# Patient Record
Sex: Female | Born: 1965 | Race: White | Hispanic: No | Marital: Single | Smoking: Never smoker
Health system: Southern US, Community
[De-identification: ages and names within clinical notes are randomized; demographics above are authoritative.]

## PROBLEM LIST (undated history)

## (undated) DIAGNOSIS — Z9889 Other specified postprocedural states: Secondary | ICD-10-CM

## (undated) DIAGNOSIS — E785 Hyperlipidemia, unspecified: Secondary | ICD-10-CM

## (undated) DIAGNOSIS — Z803 Family history of malignant neoplasm of breast: Secondary | ICD-10-CM

## (undated) DIAGNOSIS — R112 Nausea with vomiting, unspecified: Secondary | ICD-10-CM

## (undated) HISTORY — DX: Family history of malignant neoplasm of breast: Z80.3

## (undated) HISTORY — PX: TUBAL LIGATION: SHX77

## (undated) HISTORY — PX: COLONOSCOPY: SHX174

## (undated) HISTORY — DX: Hyperlipidemia, unspecified: E78.5

## (undated) HISTORY — PX: OTHER SURGICAL HISTORY: SHX169

---

## 1998-10-24 ENCOUNTER — Other Ambulatory Visit: Admission: RE | Admit: 1998-10-24 | Discharge: 1998-10-24 | Payer: Self-pay | Admitting: Obstetrics and Gynecology

## 2000-01-12 HISTORY — PX: CHOLECYSTECTOMY: SHX55

## 2000-06-14 ENCOUNTER — Other Ambulatory Visit: Admission: RE | Admit: 2000-06-14 | Discharge: 2000-06-14 | Payer: Self-pay | Admitting: Internal Medicine

## 2000-10-12 ENCOUNTER — Ambulatory Visit (HOSPITAL_COMMUNITY): Admission: RE | Admit: 2000-10-12 | Discharge: 2000-10-12 | Payer: Self-pay | Admitting: Gastroenterology

## 2000-10-12 ENCOUNTER — Encounter: Payer: Self-pay | Admitting: Gastroenterology

## 2000-10-13 ENCOUNTER — Ambulatory Visit (HOSPITAL_COMMUNITY): Admission: RE | Admit: 2000-10-13 | Discharge: 2000-10-14 | Payer: Self-pay | Admitting: General Surgery

## 2000-10-13 ENCOUNTER — Encounter (INDEPENDENT_AMBULATORY_CARE_PROVIDER_SITE_OTHER): Payer: Self-pay | Admitting: Specialist

## 2002-05-21 ENCOUNTER — Other Ambulatory Visit: Admission: RE | Admit: 2002-05-21 | Discharge: 2002-05-21 | Payer: Self-pay | Admitting: *Deleted

## 2002-05-24 ENCOUNTER — Ambulatory Visit (HOSPITAL_COMMUNITY): Admission: RE | Admit: 2002-05-24 | Discharge: 2002-05-24 | Payer: Self-pay | Admitting: *Deleted

## 2002-05-24 ENCOUNTER — Encounter (INDEPENDENT_AMBULATORY_CARE_PROVIDER_SITE_OTHER): Payer: Self-pay | Admitting: Specialist

## 2003-01-12 HISTORY — PX: DILATION AND CURETTAGE OF UTERUS: SHX78

## 2004-01-12 HISTORY — PX: OTHER SURGICAL HISTORY: SHX169

## 2004-03-05 ENCOUNTER — Encounter (INDEPENDENT_AMBULATORY_CARE_PROVIDER_SITE_OTHER): Payer: Self-pay | Admitting: *Deleted

## 2004-03-05 ENCOUNTER — Ambulatory Visit (HOSPITAL_COMMUNITY): Admission: RE | Admit: 2004-03-05 | Discharge: 2004-03-05 | Payer: Self-pay | Admitting: *Deleted

## 2004-09-22 ENCOUNTER — Encounter: Admission: RE | Admit: 2004-09-22 | Discharge: 2004-09-22 | Payer: Self-pay | Admitting: Gastroenterology

## 2006-02-09 ENCOUNTER — Encounter: Admission: RE | Admit: 2006-02-09 | Discharge: 2006-02-09 | Payer: Self-pay | Admitting: Gastroenterology

## 2006-02-11 ENCOUNTER — Encounter: Admission: RE | Admit: 2006-02-11 | Discharge: 2006-02-11 | Payer: Self-pay | Admitting: Gastroenterology

## 2006-04-06 ENCOUNTER — Other Ambulatory Visit: Admission: RE | Admit: 2006-04-06 | Discharge: 2006-04-06 | Payer: Self-pay | Admitting: Obstetrics and Gynecology

## 2006-10-27 ENCOUNTER — Other Ambulatory Visit: Admission: RE | Admit: 2006-10-27 | Discharge: 2006-10-27 | Payer: Self-pay | Admitting: Obstetrics and Gynecology

## 2007-01-12 HISTORY — PX: BREAST BIOPSY: SHX20

## 2007-02-28 ENCOUNTER — Encounter: Admission: RE | Admit: 2007-02-28 | Discharge: 2007-02-28 | Payer: Self-pay | Admitting: Gastroenterology

## 2007-03-08 ENCOUNTER — Encounter: Admission: RE | Admit: 2007-03-08 | Discharge: 2007-03-08 | Payer: Self-pay | Admitting: Gastroenterology

## 2007-03-23 ENCOUNTER — Encounter: Admission: RE | Admit: 2007-03-23 | Discharge: 2007-03-23 | Payer: Self-pay | Admitting: General Surgery

## 2007-03-23 ENCOUNTER — Encounter (INDEPENDENT_AMBULATORY_CARE_PROVIDER_SITE_OTHER): Payer: Self-pay | Admitting: Diagnostic Radiology

## 2007-05-24 ENCOUNTER — Other Ambulatory Visit: Admission: RE | Admit: 2007-05-24 | Discharge: 2007-05-24 | Payer: Self-pay | Admitting: Obstetrics and Gynecology

## 2008-03-04 ENCOUNTER — Encounter: Admission: RE | Admit: 2008-03-04 | Discharge: 2008-03-04 | Payer: Self-pay | Admitting: Gastroenterology

## 2008-11-05 ENCOUNTER — Other Ambulatory Visit: Admission: RE | Admit: 2008-11-05 | Discharge: 2008-11-05 | Payer: Self-pay | Admitting: Obstetrics and Gynecology

## 2009-03-05 ENCOUNTER — Encounter: Admission: RE | Admit: 2009-03-05 | Discharge: 2009-03-05 | Payer: Self-pay | Admitting: Gastroenterology

## 2009-11-13 ENCOUNTER — Other Ambulatory Visit: Admission: RE | Admit: 2009-11-13 | Discharge: 2009-11-13 | Payer: Self-pay | Admitting: Obstetrics and Gynecology

## 2010-01-31 ENCOUNTER — Other Ambulatory Visit: Payer: Self-pay | Admitting: Gastroenterology

## 2010-01-31 DIAGNOSIS — Z803 Family history of malignant neoplasm of breast: Secondary | ICD-10-CM

## 2010-02-01 ENCOUNTER — Encounter: Payer: Self-pay | Admitting: Gastroenterology

## 2010-03-06 ENCOUNTER — Ambulatory Visit
Admission: RE | Admit: 2010-03-06 | Discharge: 2010-03-06 | Disposition: A | Discharge status: Home / Self Care | Payer: PRIVATE HEALTH INSURANCE | Source: Ambulatory Visit | Attending: Gastroenterology | Admitting: Gastroenterology

## 2010-03-06 DIAGNOSIS — Z803 Family history of malignant neoplasm of breast: Secondary | ICD-10-CM

## 2010-05-29 NOTE — Op Note (Signed)
   NAME:  Sally Brown, DEMARINIS                       ACCOUNT NO.:  [id number]   MEDICAL RECORD NO.:  [id number]                   PATIENT TYPE:  AMB   LOCATION:  SDC                                  FACILITY:  [facility]   PHYSICIAN:   B. Earlene Plater, M.D.               DATE OF BIRTH:  16-Jan-1965   DATE OF PROCEDURE:  05/24/2002  DATE OF DISCHARGE:                                 OPERATIVE REPORT   PREOPERATIVE DIAGNOSIS:  Menometrorrhagia.   POSTOPERATIVE DIAGNOSIS:  Menometrorrhagia.   PROCEDURE:  Hysteroscopy, dilatation and curettage.   SURGEON:  Chester Holstein. Earlene Plater, M.D.   ANESTHESIA:  MAC and 10 cc of 2% Mesocaine paracervical block.   FINDINGS:  Shaggy endometrium, normal-appearing tubal ostia, no focal  lesions.   SPECIMENS:  Endometrial curettings.   COMPLICATIONS:  None.   BLOOD LOSS:  Less than 50 cc.   FLUID DEFICIT:  60 cc sorbitol.   INDICATION:  The patient with history of heavy bleeding with her menses and  bleeding in between.  Ultrasound was suggestive of focal endometrial lesions  consistent with polyps.  The patient declined sonohysterogram and preferred  to proceed directly to hysteroscopy.   DESCRIPTION OF PROCEDURE:  The patient was taken to the operating room and  MAC anesthesia obtained.  She was placed in the ski position, and prepped  and draped in a standard fashion.  The bladder was emptied with red rubber  catheter.  Exam under anesthesia showed a retroverted, normal-size uterus,  no adnexal masses palpable.   Speculum inserted.  Paracervical block placed in standard technique.  Anterior lip of the cervix grasped with a toothed tenaculum.  The uterus was  sounded to approximately 8 cm and again confirmed to be retroverted.   The cervix was easily dilated to #21.  The diagnostic hysteroscope was  inserted after being flushed with sorbitol with good uterine distention.  The endometrial cavity was visualized completely.  No focal lesion noted.  Shaggy  endometrium seen.  No definitive polyp identified.  The uterus was  then sharply curetted, and the specimen sent to pathology.   The patient tolerated the procedure well.  There were no complications.  Cervix was hemostatic after removal of the single-toothed tenaculum.  She  was taken to the recovery room awake, alert, and in stable condition.                                               Gerri Spore B. Earlene Plater, M.D.    WBD/MEDQ  D:  05/24/2002  T:  05/25/2002  Job:  [id number]

## 2010-05-29 NOTE — Op Note (Signed)
[place]  Patient:    MAKALAH, ASBERRY Visit Number: [id number] MRN: [id number]          Service Type: Attending:  Jimmye Norman, M.D. Dictated by:   Jimmye Norman, M.D. Proc. Date: 10/13/00                             Operative Report  PREOPERATIVE DIAGNOSIS:  Acute cholecystitis and cholelithiasis.  POSTOPERATIVE DIAGNOSIS:  Acute cholecystitis and cholelithiasis.  PROCEDURE:  Laparoscopic cholecystectomy.  SURGEON:  Jimmye Norman, M.D.  ASSISTANTDonnella Bi D. Pendse, M.D.  ANESTHESIA:  General endotracheal.  ESTIMATED BLOOD LOSS:  Less than 30 cc.  COMPLICATIONS:  None.  CONDITION:  Stable.  INDICATIONS FOR OPERATION:  The patient is a 45 year old with nausea, vomiting, and abdominal pain and positive sonographic Murphys, with large stones, who comes in now for an urgent cholecystectomy.  FINDINGS:  The patient had a moderately acutely inflamed gallbladder with large stones.  Anatomy was otherwise unremarkable.  DESCRIPTION OF PROCEDURE:  The patient was taken to the operating room and placed on the table in supine position.  After an adequate general anesthetic was administered, she was prepped and draped in the usual sterile manner, exposing the midline and the right upper quadrant of her abdomen.  A supraumbilical curvilinear incision was made using a #11 blade and dissected down to the fascia.  We used the Hasson technique at this time.  The fascia was grasped with Kocher clamps.  We split the fascia in between that and subsequently placed a pursestring.  We then used a Kelly clamp to bluntly get into the peritoneal cavity and subsequently passed the Hasson, secured it in place with the pursestring suture of 0 Vicryl.  Subsequently two right costal margin 5 mm cannulas were passed under direct vision, and a subxiphoid 10/11 mm cannula passed.  With all cannulas in place, the patient was placed in reverse Trendelenburg.  The  left side was tilted down.  The gallbladder was dissected out.  The peritoneum overlying the triangle of Calot and hepatoduodenal triangle was dissected out, exposing the cystic duct and the cystic artery with minimal ambiguity.  Both were clipped proximally and distally and then transected. The gallbladder was then dissected out of its bed with electrocautery; however, we did enter the gallbladder once near the dome and we had to use an Endocatch bag to bring it out through the supraumbilical site.  Once this was done we irrigated with 2 L of warm saline.  There was minimal bleeding and clear effluent on return.  We ligated the supraumbilical fascial site using the pursestring suture, which was left in place, and then we closed all skin sites with subcuticular stitches of 4-0 Vicryl.  Marcaine 0.25% was injected at the incision sites.  All needle counts, sponge counts, and instrument counts were correct.  Sterile dressings applied. Dictated by:   Jimmye Norman, M.D. Attending:  Jimmye Norman, M.D. DD:  10/13/00 TD:  10/13/00 Job: [id number]

## 2010-05-29 NOTE — H&P (Signed)
NAMEJANIA, Sally Brown                         ACCOUNT NO.:  [id number]   MEDICAL RECORD NO.:  [id number]                   PATIENT TYPE:  AMB   LOCATION:  SDC                                  [facility]ACILITY:  [facility]   PHYSICIAN:  [place] B. Earlene Plater, M.D.               DATE OF BIRTH:  1965-01-19   DATE OF ADMISSION:  05/24/2002  DATE OF DISCHARGE:                                HISTORY & PHYSICAL   CHIEF COMPLAINT:  Abnormal bleeding.   PROCEDURE:  Hysteroscopy and D&C.   HISTORY OF PRESENT ILLNESS:  A 45 year old white female, gravida 4, para 3,  abortus 1, with a several month history of heavy vaginal bleeding usually  around the time of her menses, but also in between bleeding. Often it  soaking through a Super tampon and Maxi pad at the same time.  Pelvic  ultrasound in the office showed no evidence of uterine fibroids, however,  the endometrial lining itself was thickened and there were focal areas of  hyperechogenic appearance consistent with polyps.  I offered the patient a  saline sonogram to further delineate, however, the patient would like to  move on to hysteroscopy for definitive diagnosis and treatment.   PAST MEDICAL HISTORY:  Hypercholesterolemia.   PAST SURGICAL HISTORY:  Tubal ligation, arm surgery on the left arm, and  laparoscopic cholecystectomy.   MEDICATIONS:  Lipitor.   ALLERGIES:  CODEINE, SULFA.   SOCIAL HISTORY:  No alcohol, tobacco, or drugs.   FAMILY HISTORY:  Noncontributory.   REVIEW OF SYSTEMS:  Otherwise negative.   PHYSICAL EXAMINATION:  VITAL SIGNS:  Blood pressure 112/80, weight 170,  height 5 foot 2 inches.  GENERAL:  Alert and oriented and in no acute distress.  SKIN:  Warm and dry with no lesions.  HEART:  Regular rate and rhythm.  LUNGS:  Clear to auscultation.  ABDOMEN:  Liver and spleen are normal with no hernia.  PELVIC:  Normal external genitalia.  Vagina and cervix normal.   Pelvic ultrasound as outlined above shows retroverted,  normal size uterus  with three hypoechoic areas noted in the endometrial lining consistent with  polyps.   ASSESSMENT:  Abnormal bleeding and ultrasound suggestion of endometrial  polyp.    PLAN:  Hysteroscopy and D&C, polyp removal.  Operative risks discussed  including infection, bleeding, uterine perforation, and damage to  surrounding organs.  All questions were answered and the patient wishes to  proceed.                                               Gerri Spore B. Earlene Plater, M.D.    WBD/MEDQ  D:  05/23/2002  T:  05/23/2002  Job:  [id number]   cc:   Olene Craven, M.D.  [address]  [address]  [place]  [place] [address]  Fax: [phone]

## 2010-05-29 NOTE — H&P (Signed)
[place]  Patient:    OZETTA, FLATLEY Visit Number: [id number] MRN: [id number]          Service Type: DSU Location: [id number] Attending Physician:  Cherylynn Ridges Dictated by:   Jimmye Norman, M.D. Admit Date:  10/13/2000                           History and Physical  IDENTIFICATION AND CHIEF COMPLAINT:  The patient is a 45 year old female with symptomatic cholelithiasis and subacute cholecystitis.  HISTORY OF PRESENT ILLNESS:  The patient has been plagued mostly with vomiting over the last several weeks, worsening the last three days.  She was seen by her primary care physician who did an ultrasound that showed a thickened gallbladder wall with large stones and surgical consultation was obtained. She was tender in her right upper quadrant with epigastric and right upper quadrant pain and had a sonographic Murphys sign.  She now comes in for an urgent laparoscopic cholecystectomy.  PAST MEDICAL HISTORY:  Significant only for hypercholesterolemia, for which she takes Lipitor.  ALLERGIES:  CODEINE.  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  REVIEW OF SYSTEMS:  She has had no fevers or chills, no jaundice, no constipation or diarrhea, no respiratory problems, no shortness of breath or chest pain.  PHYSICAL EXAMINATION:  VITAL SIGNS:  She is afebrile.  Other vital signs are stable.  HEENT:  She is normocephalic and atraumatic and anicteric.  NECK:  Supple.  CHEST:  Clear.  CARDIAC:  Regular rhythm and rate with no murmurs, gallops, rubs, or heaves.  ABDOMEN:  Soft and tender in the right upper quadrant with no rebound or guarding.  She does have good bowel sounds.  She has been n.p.o. since yesterday.  RECTAL AND PELVIC:  Deferred.  LABORATORY STUDIES:  Show normal LFTs, normal white count, normal hemoglobin.  IMPRESSION:  Symptomatic cholelithiasis  and possible subacute cholecystitis.  PLAN:  Laparoscopic cholecystectomy.  No plan for cholangiogram currently unless the anatomy is confusing.Dictated by:   Jimmye Norman, M.D.  Attending Physician:  Cherylynn Ridges DD:  10/13/00 TD:  10/13/00 Job: [id number]

## 2010-05-29 NOTE — Op Note (Signed)
Sally Brown, Sally Brown             ACCOUNT NO.:  [id number]   MEDICAL RECORD NO.:  [id number]          PATIENT TYPE:  AMB   LOCATION:  SDC                           [facility]ILITY:  [facility]   PHYSICIAN:  [place] B. Earlene Plater, M.D.  DATE OF BIRTH:  [birth year]   DATE OF PROCEDURE:  03/05/2004  DATE OF DISCHARGE:                                 OPERATIVE REPORT   PREOPERATIVE DIAGNOSES:  1.  Abnormal bleeding.  2.  Endometrial polyp.   POSTOPERATIVE DIAGNOSES:  1.  Abnormal bleeding.  2.  Endometrial polyp.   PROCEDURE:  Hysteroscopy, dilatation and curettage, polyp removal and  NovaSure endometrial ablation.   SURGEON:  Chester Holstein. Earlene Plater, M.D.   ANESTHESIA:  MAC and 20 mL 1% Nesacaine paracervical block.   SPECIMENS:  Endometrial polyp and endometrial curettings.   FINDINGS:  Endometrial polyp in the right cornu, otherwise no other  abnormalities.   ESTIMATED BLOOD LOSS:  Minimal.   FLUID DEFICIT:  100 mL.   COMPLICATIONS:  None.   INDICATIONS:  Patient with a history of irregular heavy vaginal bleeding.  Ultrasound suggestive of a focal endometrial polyp.  The patient was also  requesting permanent endometrial ablation for minimally-invasive treatment  of her heavy menstrual bleeding.   PROCEDURE:  The patient was taken to the operating room and MAC anesthesia  obtained.  She was prepped and draped in standard fashion and bladder  emptied with a red rubber catheter.  Exam under anesthesia showed a slightly  retroverted uterus with no adnexal masses, at the upper limits of normal  size for the uterus.  A speculum inserted, paracervical block placed, and a  single-tooth attached to the anterior lip of the cervix.  The uterus was  sounded to 8 cm, cervical length estimated with Shawnie Pons dilator at 4 cm,  diagnostic hysteroscope was inserted after being flushed with lactated  Ringer's.  The cavity looked normal other than an endometrial polyp at the  right cornu.  I attempted to remove this  with Randall stone forceps but was  unsuccessful.  The cervix was then dilated up to a #31 and the resectoscope  inserted after being flushed with sorbitol.  Attempt made to resect.  However, due to location the resectoscope loop could not access the polyp.  I therefore switched over to the operative channel on the scope and inserted  a grasper and removed the polyp directly.   The distention medium was switched back to Ringer's and the cavity flushed.   The NovaSure device was then inserted to the fundus and the array deployed.  The standard maneuvers then undertaken to fully deploy the array.  The  cavity width was 4.6 cm.   CO2 test was performed and passed.  The device was energized.  The power was  101 watts.  The time was two minutes.  The array was retracted and device  removed, scope reinserted and good endometrial coverage noted.   The scope was removed and the single-tooth removed.  Each site from the  single-tooth was bleeding.  They were made hemostatic with a simple stitch  of 2-0 chromic.  The patient tolerated the procedure well.  There were no complications.  She  was taken to the recovery room awake and alert, in stable condition.      WBD/MEDQ  D:  03/05/2004  T:  03/05/2004  Job:  [id number]

## 2011-02-08 ENCOUNTER — Other Ambulatory Visit: Payer: Self-pay | Admitting: Gastroenterology

## 2011-02-08 DIAGNOSIS — Z1231 Encounter for screening mammogram for malignant neoplasm of breast: Secondary | ICD-10-CM

## 2011-03-09 ENCOUNTER — Ambulatory Visit
Admission: RE | Admit: 2011-03-09 | Discharge: 2011-03-09 | Disposition: A | Discharge status: Home / Self Care | Payer: PRIVATE HEALTH INSURANCE | Source: Ambulatory Visit | Attending: Gastroenterology | Admitting: Gastroenterology

## 2011-03-09 DIAGNOSIS — Z1231 Encounter for screening mammogram for malignant neoplasm of breast: Secondary | ICD-10-CM

## 2011-08-30 ENCOUNTER — Other Ambulatory Visit: Payer: Self-pay | Admitting: Surgery

## 2012-02-04 ENCOUNTER — Other Ambulatory Visit: Payer: Self-pay | Admitting: Gastroenterology

## 2012-02-04 DIAGNOSIS — Z1231 Encounter for screening mammogram for malignant neoplasm of breast: Secondary | ICD-10-CM

## 2012-03-09 ENCOUNTER — Ambulatory Visit
Admission: RE | Admit: 2012-03-09 | Discharge: 2012-03-09 | Disposition: A | Discharge status: Home / Self Care | Payer: BC Managed Care – PPO | Source: Ambulatory Visit | Attending: Gastroenterology | Admitting: Gastroenterology

## 2012-03-09 DIAGNOSIS — Z1231 Encounter for screening mammogram for malignant neoplasm of breast: Secondary | ICD-10-CM

## 2012-11-23 DIAGNOSIS — Z3202 Encounter for pregnancy test, result negative: Secondary | ICD-10-CM | POA: Insufficient documentation

## 2012-11-23 DIAGNOSIS — R109 Unspecified abdominal pain: Secondary | ICD-10-CM | POA: Insufficient documentation

## 2012-11-23 DIAGNOSIS — Z9089 Acquired absence of other organs: Secondary | ICD-10-CM | POA: Insufficient documentation

## 2012-11-23 DIAGNOSIS — R0602 Shortness of breath: Secondary | ICD-10-CM | POA: Insufficient documentation

## 2012-11-23 DIAGNOSIS — R112 Nausea with vomiting, unspecified: Secondary | ICD-10-CM | POA: Insufficient documentation

## 2012-11-24 ENCOUNTER — Emergency Department (HOSPITAL_COMMUNITY)
Admission: EM | Admit: 2012-11-24 | Discharge: 2012-11-24 | Disposition: A | Discharge status: Home / Self Care | Payer: BC Managed Care – PPO | Attending: Emergency Medicine | Admitting: Emergency Medicine

## 2012-11-24 ENCOUNTER — Encounter (HOSPITAL_COMMUNITY): Payer: Self-pay | Admitting: Emergency Medicine

## 2012-11-24 ENCOUNTER — Emergency Department (HOSPITAL_COMMUNITY): Payer: BC Managed Care – PPO

## 2012-11-24 DIAGNOSIS — R109 Unspecified abdominal pain: Secondary | ICD-10-CM

## 2012-11-24 LAB — URINALYSIS, ROUTINE W REFLEX MICROSCOPIC
Bilirubin Urine: NEGATIVE
Glucose, UA: NEGATIVE mg/dL
Ketones, ur: NEGATIVE mg/dL
Protein, ur: NEGATIVE mg/dL
pH: 6 (ref 5.0–8.0)

## 2012-11-24 LAB — CBC WITH DIFFERENTIAL/PLATELET
Basophils Absolute: 0.1 10*3/uL (ref 0.0–0.1)
Basophils Relative: 1 % (ref 0–1)
Eosinophils Absolute: 0.5 10*3/uL (ref 0.0–0.7)
Eosinophils Relative: 5 % (ref 0–5)
HCT: 37.5 % (ref 36.0–46.0)
Hemoglobin: 13.1 g/dL (ref 12.0–15.0)
MCH: 29.4 pg (ref 26.0–34.0)
MCHC: 34.9 g/dL (ref 30.0–36.0)
MCV: 84.1 fL (ref 78.0–100.0)
Monocytes Absolute: 0.7 10*3/uL (ref 0.1–1.0)
Monocytes Relative: 7 % (ref 3–12)
RDW: 13.4 % (ref 11.5–15.5)

## 2012-11-24 LAB — COMPREHENSIVE METABOLIC PANEL
AST: 20 U/L (ref 0–37)
Albumin: 3.8 g/dL (ref 3.5–5.2)
BUN: 17 mg/dL (ref 6–23)
Calcium: 9.6 mg/dL (ref 8.4–10.5)
Chloride: 103 mEq/L (ref 96–112)
Creatinine, Ser: 0.69 mg/dL (ref 0.50–1.10)
Total Bilirubin: 0.2 mg/dL — ABNORMAL LOW (ref 0.3–1.2)
Total Protein: 7 g/dL (ref 6.0–8.3)

## 2012-11-24 LAB — WET PREP, GENITAL
Clue Cells Wet Prep HPF POC: NONE SEEN
Trich, Wet Prep: NONE SEEN
Yeast Wet Prep HPF POC: NONE SEEN

## 2012-11-24 LAB — POCT PREGNANCY, URINE: Preg Test, Ur: NEGATIVE

## 2012-11-24 LAB — LIPASE, BLOOD: Lipase: 47 U/L (ref 11–59)

## 2012-11-24 MED ORDER — ONDANSETRON HCL 4 MG PO TABS: 4.0000 mg | ORAL_TABLET | Freq: Four times a day (QID) | ORAL | Status: DC

## 2012-11-24 MED ORDER — OXYCODONE-ACETAMINOPHEN 5-325 MG PO TABS: 1.0000 | ORAL_TABLET | Freq: Four times a day (QID) | ORAL | Status: DC | PRN

## 2012-11-24 MED ORDER — SODIUM CHLORIDE 0.9 % IV BOLUS (SEPSIS)
1000.0000 mL | Freq: Once | INTRAVENOUS | Status: AC
  Administered 2012-11-24: 1000 mL via INTRAVENOUS

## 2012-11-24 MED ORDER — IOHEXOL 300 MG/ML  SOLN
100.0000 mL | Freq: Once | INTRAMUSCULAR | Status: AC | PRN
  Administered 2012-11-24: 100 mL via INTRAVENOUS

## 2012-11-24 MED ORDER — IOHEXOL 300 MG/ML  SOLN
50.0000 mL | Freq: Once | INTRAMUSCULAR | Status: AC | PRN
  Administered 2012-11-24: 50 mL via ORAL

## 2012-11-24 MED ORDER — MORPHINE SULFATE 4 MG/ML IJ SOLN
4.0000 mg | Freq: Once | INTRAMUSCULAR | Status: AC
  Administered 2012-11-24: 4 mg via INTRAVENOUS
  Filled 2012-11-24: qty 1

## 2012-11-24 MED ORDER — ONDANSETRON HCL 4 MG/2ML IJ SOLN
4.0000 mg | INTRAMUSCULAR | Status: AC
  Administered 2012-11-24: 4 mg via INTRAVENOUS
  Filled 2012-11-24: qty 2

## 2012-11-24 NOTE — ED Notes (Signed)
Returned from CT.

## 2012-11-24 NOTE — ED Provider Notes (Signed)
CSN: [id number]     Arrival date & time 11/23/12  2357 History   First MD Initiated Contact with Patient 11/24/12 0103     Chief Complaint  Patient presents with  . Abdominal Pain   (Consider location/radiation/quality/duration/timing/severity/associated sxs/prior Treatment) HPI Comments: Patient is a 47 y/o female with no significant PMH and surgical hx significant for cholecystectomy who presents for abdominal pain. Patient states that pain is located in her left mid abdomen and radiates down towards her left inguinal region. Patient states the pain has been sporadic over the past week, but constant since yesterday morning. Pain has been gradually worsening since yesterday and without alleviating factors; patient took 800mg  ibuprofen without relief. She states that movement, walking, and driving over bumps on the way to the ED made her pain worse. She endorses shortness of breath and 2 episodes of NB/NB emesis associated with worsening pain. She denies associated fever, CP, diarrhea, melena, hematochezia, dysuria, hematuria, vaginal bleeding or d/c, back pain, and numbness/tingling.  The history is provided by the patient. No language interpreter was used.    History reviewed. No pertinent past medical history. History reviewed. No pertinent past surgical history. History reviewed. No pertinent family history. History  Substance Use Topics  . Smoking status: Never Smoker   . Smokeless tobacco: Not on file  . Alcohol Use: Yes   OB History   Grav Para Term Preterm Abortions TAB SAB Ect Mult Living                 Review of Systems  Constitutional: Negative for fever.  Respiratory: Positive for shortness of breath.   Gastrointestinal: Positive for nausea, vomiting and abdominal pain.  All other systems reviewed and are negative.    Allergies  Codeine and Sulfa antibiotics  Home Medications   Current Outpatient Rx  Name  Route  Sig  Dispense  Refill  . ibuprofen  (ADVIL,MOTRIN) 200 MG tablet   Oral   Take 800 mg by mouth every 6 (six) hours as needed. For pain         . ondansetron (ZOFRAN) 4 MG tablet   Oral   Take 1 tablet (4 mg total) by mouth every 6 (six) hours.   12 tablet   0   . oxyCODONE-acetaminophen (PERCOCET/ROXICET) 5-325 MG per tablet   Oral   Take 1-2 tablets by mouth every 6 (six) hours as needed for severe pain.   13 tablet   0    BP 125/81  Pulse 53  Temp(Src) 97.6 F (36.4 C) (Oral)  Resp 18  Ht 5\' 2"  (1.575 m)  Wt 160 lb (72.576 kg)  BMI 29.26 kg/m2  SpO2 95%  LMP 11/23/2012  Physical Exam  Nursing note and vitals reviewed. Constitutional: She is oriented to person, place, and time. She appears well-developed and well-nourished. No distress.  HENT:  Head: Normocephalic and atraumatic.  Eyes: Conjunctivae and EOM are normal. No scleral icterus.  Neck: Normal range of motion.  Cardiovascular: Normal rate and normal heart sounds.   Pulmonary/Chest: Effort normal and breath sounds normal. No respiratory distress. She has no wheezes. She has no rales.  Abdominal: Soft. Normal appearance. She exhibits no distension and no pulsatile midline mass. There is tenderness. There is no rebound, no guarding, no tenderness at McBurney's point and negative Murphy's sign.    No peritoneal signs or involuntary guarding  Genitourinary: Vagina normal and uterus normal. There is no rash, tenderness, lesion or injury on the right labia. There is  no rash, tenderness, lesion or injury on the left labia. Cervix exhibits no motion tenderness, no discharge and no friability. Right adnexum displays no mass, no tenderness and no fullness. Left adnexum displays no mass, no tenderness and no fullness. No tenderness around the vagina. No vaginal discharge found.  Musculoskeletal: Normal range of motion.  Neurological: She is alert and oriented to person, place, and time.  Skin: Skin is warm and dry. No rash noted. She is not diaphoretic. No  erythema. No pallor.  Psychiatric: She has a normal mood and affect. Her behavior is normal.    ED Course  Procedures (including critical care time) Labs Review Labs Reviewed  COMPREHENSIVE METABOLIC PANEL - Abnormal; Notable for the following:    Glucose, Bld 112 (*)    Total Bilirubin 0.2 (*)    All other components within normal limits  WET PREP, GENITAL  GC/CHLAMYDIA PROBE AMP  URINALYSIS, ROUTINE W REFLEX MICROSCOPIC  CBC WITH DIFFERENTIAL  LIPASE, BLOOD  POCT PREGNANCY, URINE   Imaging Review Ct Abdomen Pelvis W Contrast  11/24/2012   CLINICAL DATA:  Left lower quadrant abdominal pain for 1 week.  EXAM: CT ABDOMEN AND PELVIS WITH CONTRAST  TECHNIQUE: Multidetector CT imaging of the abdomen and pelvis was performed using the standard protocol following bolus administration of intravenous contrast.  CONTRAST:  OMNIPAQUE IOHEXOL 300 MG/ML  SOLN  COMPARISON:  None.  FINDINGS: The visualized lung bases are clear.  The liver and spleen are unremarkable in appearance. The patient is status post cholecystectomy, with clips noted in the gallbladder fossa. The pancreas and adrenal glands are unremarkable.  The kidneys are unremarkable in appearance. There is no evidence of hydronephrosis. No renal or ureteral stones are seen. No perinephric stranding is appreciated.  No free fluid is identified. The small bowel is unremarkable in appearance. The stomach is within normal limits. No acute vascular abnormalities are seen.  The appendix is normal in caliber, without evidence for appendicitis. The colon is partially filled with stool. Minimal diverticulosis is noted along the proximal sigmoid colon, without evidence of diverticulitis.  The bladder is largely decompressed and grossly unremarkable in appearance. The uterus is within normal limits. The ovaries are relatively symmetric; no suspicious adnexal masses are seen. There appears to be a small left-sided hydrosalpinx; given apparent wall  enhancement, mild pyosalpinx cannot be excluded, though no significant associated soft tissue inflammation is seen. No inguinal lymphadenopathy is seen.  No acute osseous abnormalities are identified.  IMPRESSION: 1. Apparent small left-sided hydrosalpinx noted. Given mild wall enhancement, mild pyosalpinx cannot be excluded, though no significant associated soft tissue inflammation is seen. Suggest clinical correlation for associated symptoms. 2. Minimal diverticulosis along the proximal sigmoid colon, without evidence of diverticulitis.   Electronically Signed   By: Roanna Raider M.D.   On: 11/24/2012 02:52    EKG Interpretation   None       MDM   1. Abdominal pain    Patient presents for pain to her L mid abdomen, intermittent for 1 week but constant since yesterday AM and worsening. Patient well and nontoxic appearing, hemodynamically stable, and afebrile on arrival. Physical exam with focal TTP to L mid abdomen without peritoneal signs or guarding. W/u included CBC, CMP, lipase, UA, wet prep, GC/Chlamydia, and Upreg all of which were unremarkable. CT abd pelvis ordered for further evaluation of symptoms with relatively unremarkable imaging; CT findings only with small L hydrosalpinx vs pyosalpinx without soft tissue inflammation.  Have discussed further work  up with pelvic U/S today which patient declines. Her pain has been very well controlled with IVF, morphine, and zofran. Believe she is stable and appropriate for d/c with OBGYN follow up. Percocet prescribed for pain control and zofran prescribed for nausea. Return precautions discussed and patient agreeable to plan with no unaddressed concerns.  Antony Madura, PA-C 11/24/12 2234

## 2012-11-24 NOTE — ED Notes (Signed)
Pt complains of abd pain for one week, tonight she states the pain is severe, no vomiting or diarrhea

## 2012-11-25 NOTE — ED Provider Notes (Signed)
Medical screening examination/treatment/procedure(s) were performed by non-physician practitioner and as supervising physician I was immediately available for consultation/collaboration.  Aolani Piggott, MD 11/25/12 2237 

## 2013-01-30 ENCOUNTER — Other Ambulatory Visit: Payer: Self-pay

## 2013-01-30 DIAGNOSIS — Z1231 Encounter for screening mammogram for malignant neoplasm of breast: Secondary | ICD-10-CM

## 2013-03-12 ENCOUNTER — Ambulatory Visit
Admission: RE | Admit: 2013-03-12 | Discharge: 2013-03-12 | Disposition: A | Discharge status: Home / Self Care | Payer: BC Managed Care – PPO | Source: Ambulatory Visit

## 2013-03-12 DIAGNOSIS — Z1231 Encounter for screening mammogram for malignant neoplasm of breast: Secondary | ICD-10-CM

## 2013-10-09 ENCOUNTER — Other Ambulatory Visit (HOSPITAL_COMMUNITY)
Admission: RE | Admit: 2013-10-09 | Discharge: 2013-10-09 | Disposition: A | Discharge status: Home / Self Care | Payer: BC Managed Care – PPO | Source: Ambulatory Visit | Attending: Obstetrics and Gynecology | Admitting: Obstetrics and Gynecology

## 2013-10-09 ENCOUNTER — Other Ambulatory Visit: Payer: Self-pay | Admitting: Obstetrics and Gynecology

## 2013-10-09 DIAGNOSIS — Z1151 Encounter for screening for human papillomavirus (HPV): Secondary | ICD-10-CM | POA: Insufficient documentation

## 2013-10-09 DIAGNOSIS — Z01419 Encounter for gynecological examination (general) (routine) without abnormal findings: Secondary | ICD-10-CM | POA: Insufficient documentation

## 2013-10-10 LAB — CYTOLOGY - PAP

## 2013-10-26 ENCOUNTER — Other Ambulatory Visit: Payer: Self-pay

## 2014-02-13 ENCOUNTER — Other Ambulatory Visit: Payer: Self-pay

## 2014-02-13 DIAGNOSIS — Z1231 Encounter for screening mammogram for malignant neoplasm of breast: Secondary | ICD-10-CM

## 2014-03-14 ENCOUNTER — Ambulatory Visit
Admission: RE | Admit: 2014-03-14 | Discharge: 2014-03-14 | Disposition: A | Discharge status: Home / Self Care | Payer: BLUE CROSS/BLUE SHIELD | Source: Ambulatory Visit

## 2014-03-14 DIAGNOSIS — Z1231 Encounter for screening mammogram for malignant neoplasm of breast: Secondary | ICD-10-CM

## 2014-10-08 ENCOUNTER — Other Ambulatory Visit: Payer: Self-pay | Admitting: Obstetrics and Gynecology

## 2014-10-08 ENCOUNTER — Other Ambulatory Visit (HOSPITAL_COMMUNITY)
Admission: RE | Admit: 2014-10-08 | Discharge: 2014-10-08 | Disposition: A | Discharge status: Home / Self Care | Payer: BLUE CROSS/BLUE SHIELD | Source: Ambulatory Visit | Attending: Obstetrics and Gynecology | Admitting: Obstetrics and Gynecology

## 2014-10-08 DIAGNOSIS — Z01419 Encounter for gynecological examination (general) (routine) without abnormal findings: Secondary | ICD-10-CM | POA: Insufficient documentation

## 2014-10-10 LAB — CYTOLOGY - PAP

## 2015-02-18 ENCOUNTER — Other Ambulatory Visit: Payer: Self-pay | Admitting: Gastroenterology

## 2015-02-18 DIAGNOSIS — Z1231 Encounter for screening mammogram for malignant neoplasm of breast: Secondary | ICD-10-CM

## 2015-02-26 ENCOUNTER — Other Ambulatory Visit: Payer: Self-pay | Admitting: Gastroenterology

## 2015-03-17 ENCOUNTER — Ambulatory Visit: Payer: BLUE CROSS/BLUE SHIELD

## 2015-03-24 ENCOUNTER — Ambulatory Visit
Admission: RE | Admit: 2015-03-24 | Discharge: 2015-03-24 | Disposition: A | Discharge status: Home / Self Care | Payer: BLUE CROSS/BLUE SHIELD | Source: Ambulatory Visit | Attending: Gastroenterology | Admitting: Gastroenterology

## 2015-03-24 DIAGNOSIS — Z1231 Encounter for screening mammogram for malignant neoplasm of breast: Secondary | ICD-10-CM

## 2016-02-17 ENCOUNTER — Other Ambulatory Visit: Payer: Self-pay | Admitting: Gastroenterology

## 2016-02-17 DIAGNOSIS — Z1231 Encounter for screening mammogram for malignant neoplasm of breast: Secondary | ICD-10-CM

## 2016-03-25 ENCOUNTER — Ambulatory Visit
Admission: RE | Admit: 2016-03-25 | Discharge: 2016-03-25 | Disposition: A | Discharge status: Home / Self Care | Payer: PRIVATE HEALTH INSURANCE | Source: Ambulatory Visit | Attending: Gastroenterology | Admitting: Gastroenterology

## 2016-03-25 DIAGNOSIS — Z1231 Encounter for screening mammogram for malignant neoplasm of breast: Secondary | ICD-10-CM

## 2016-05-21 ENCOUNTER — Other Ambulatory Visit: Payer: Self-pay | Admitting: Gastroenterology

## 2016-05-21 ENCOUNTER — Ambulatory Visit
Admission: RE | Admit: 2016-05-21 | Discharge: 2016-05-21 | Disposition: A | Discharge status: Home / Self Care | Payer: PRIVATE HEALTH INSURANCE | Source: Ambulatory Visit | Attending: Gastroenterology | Admitting: Gastroenterology

## 2016-05-21 DIAGNOSIS — R05 Cough: Secondary | ICD-10-CM

## 2016-05-21 DIAGNOSIS — R059 Cough, unspecified: Secondary | ICD-10-CM

## 2016-11-22 ENCOUNTER — Other Ambulatory Visit: Payer: Self-pay | Admitting: Obstetrics and Gynecology

## 2016-11-22 ENCOUNTER — Other Ambulatory Visit (HOSPITAL_COMMUNITY)
Admission: RE | Admit: 2016-11-22 | Discharge: 2016-11-22 | Disposition: A | Discharge status: Home / Self Care | Payer: PRIVATE HEALTH INSURANCE | Source: Ambulatory Visit | Attending: Obstetrics and Gynecology | Admitting: Obstetrics and Gynecology

## 2016-11-22 DIAGNOSIS — Z124 Encounter for screening for malignant neoplasm of cervix: Secondary | ICD-10-CM | POA: Diagnosis present

## 2016-11-26 LAB — CYTOLOGY - PAP
Diagnosis: NEGATIVE
HPV: NOT DETECTED

## 2016-12-08 ENCOUNTER — Other Ambulatory Visit: Payer: Self-pay | Admitting: Obstetrics and Gynecology

## 2017-03-03 ENCOUNTER — Other Ambulatory Visit: Payer: Self-pay | Admitting: Gastroenterology

## 2017-03-03 DIAGNOSIS — Z1231 Encounter for screening mammogram for malignant neoplasm of breast: Secondary | ICD-10-CM

## 2017-04-04 ENCOUNTER — Ambulatory Visit
Admission: RE | Admit: 2017-04-04 | Discharge: 2017-04-04 | Disposition: A | Discharge status: Home / Self Care | Payer: PRIVATE HEALTH INSURANCE | Source: Ambulatory Visit | Attending: Gastroenterology | Admitting: Gastroenterology

## 2017-04-04 DIAGNOSIS — Z1231 Encounter for screening mammogram for malignant neoplasm of breast: Secondary | ICD-10-CM

## 2017-04-06 ENCOUNTER — Other Ambulatory Visit: Payer: Self-pay | Admitting: Urology

## 2017-05-09 ENCOUNTER — Other Ambulatory Visit: Payer: Self-pay | Admitting: Gastroenterology

## 2017-05-09 DIAGNOSIS — Z803 Family history of malignant neoplasm of breast: Secondary | ICD-10-CM

## 2017-05-12 NOTE — Patient Instructions (Addendum)
Your procedure is scheduled on:  Wednesday, May 22  Enter through the Main Entrance of [facility] at:  7:30 am  Pick up the phone at the desk and dial [phone].  Call this number if you have problems the morning of surgery: [phone].  Remember: Do NOT eat food or Do NOT drink clear liquids (including water) after midnight Tuesday.  Take these medicines the morning of surgery with a SIP OF WATER: None  Do NOT wear jewelry (body piercing), metal hair clips/bobby pins, make-up, or nail polish. Do NOT wear lotions, powders, or perfumes.  You may wear deoderant. Do NOT shave for 48 hours prior to surgery. Do NOT bring valuables to the hospital. Contacts may not be worn into surgery.  Leave suitcase in car.  After surgery it may be brought to your room.  For patients admitted to the hospital, checkout time is 11:00 AM the day of discharge. Have a responsible adult drive you home and stay with you for 24 hours after your procedure.  Home with Daughter Lonn [place] cell [phone]

## 2017-05-19 ENCOUNTER — Other Ambulatory Visit: Payer: Self-pay | Admitting: Obstetrics and Gynecology

## 2017-05-20 ENCOUNTER — Other Ambulatory Visit: Payer: Self-pay

## 2017-05-20 ENCOUNTER — Encounter (HOSPITAL_COMMUNITY): Payer: Self-pay

## 2017-05-20 ENCOUNTER — Encounter (HOSPITAL_COMMUNITY)
Admission: RE | Admit: 2017-05-20 | Discharge: 2017-05-20 | Disposition: A | Discharge status: Home / Self Care | Payer: PRIVATE HEALTH INSURANCE | Source: Ambulatory Visit | Attending: Obstetrics and Gynecology | Admitting: Obstetrics and Gynecology

## 2017-05-20 DIAGNOSIS — Z0183 Encounter for blood typing: Secondary | ICD-10-CM | POA: Insufficient documentation

## 2017-05-20 DIAGNOSIS — Z01812 Encounter for preprocedural laboratory examination: Secondary | ICD-10-CM | POA: Diagnosis not present

## 2017-05-20 HISTORY — DX: Nausea with vomiting, unspecified: R11.2

## 2017-05-20 HISTORY — DX: Other specified postprocedural states: Z98.890

## 2017-05-20 LAB — CBC
HEMATOCRIT: 41.2 % (ref 36.0–46.0)
HEMOGLOBIN: 13.7 g/dL (ref 12.0–15.0)
MCH: 29.3 pg (ref 26.0–34.0)
MCHC: 33.3 g/dL (ref 30.0–36.0)
MCV: 88.2 fL (ref 78.0–100.0)
Platelets: 316 10*3/uL (ref 150–400)
RBC: 4.67 MIL/uL (ref 3.87–5.11)
RDW: 13.6 % (ref 11.5–15.5)
WBC: 8.1 10*3/uL (ref 4.0–10.5)

## 2017-05-20 LAB — ABO/RH: ABO/RH(D): A POS

## 2017-05-20 LAB — TYPE AND SCREEN
ABO/RH(D): A POS
Antibody Screen: NEGATIVE

## 2017-05-31 ENCOUNTER — Other Ambulatory Visit: Payer: Self-pay | Admitting: Obstetrics and Gynecology

## 2017-05-31 NOTE — H&P (Signed)
Chief Complaint(s):   pre op history and physical exam / Abnormal uteirne bleeding/ fibroids/ incontinence   HPI:  General 52 y/o presents for preop history and physical exam in preparation for vaginal hysterectomy with bilateral salpingectomy. She has abnormal uterine bleeding and uterine fibroids. she bleedins for 2 weeks out of the month. she has h/o novasure ablaiton performed by Dr. Maryelizabeth Rowan.t. She changes a supper tampon 4-5 tmes on her heaviest day. She has intermenstrual bleeding. Endometrial biopsy 12/09/2106 was benign. Her mother has been diagnosed with breast cancer and she is afraid of hormonal therapy for abnormal uterine bleeding.  ultrasound report 12/08/2016- uterus measures 10 cm x 7 cm x 6 cm. multiple uterine fibroids seen the largest is anterior and 4.3 cm. the right ovary contains a simple cyst 3.4 cm.. the left ovary appears normal. Current Medication: Not-Taking  Z-pac 250mg  tablet 2 po today, then 1 po qd x 4 days use as directed     Medication List reviewed and reconciled with the patient   Medical History:  stress incontinence      Allergies/Intolerance:  Sulfa drugs (for allergy) - hives     Codeine (for allergy) - hives   Gyn History:  Sexual activity currently sexually active. Periods : irregular. LMP 04/2017. Birth control BTL. Last pap smear date 11/22/16-neg. Last mammogram date 03/25/2016. H/O Abnormal pap smear ASCUS, repeat pap ok. Denies STD. Menarche 89.   OB History:  Number of pregnancies 4. miscarriages 1. Pregnancy # 1 live birth, vaginal delivery. Pregnancy # 2 miscarriage. Pregnancy # 3 live birth, vaginal delivery. Pregnancy # 4: live birth, vaginal delivery.   Surgical History:  BTL 1992     Nerve release left arm 1993     Laparoscopic Cholecystectomy 2002     Novasure Dr. Maryelizabeth Rowan 2006     hysteroscopy wtih Dr. Maryelizabeth Rowan 2005   Hospitalization:  childbirth x 3 [facility]     Nerve release     Laparoscopic cholecystectomy     Not in  the past year 05/2017   Family History:  Father: deceased, dementia    Mother: deceased, Breast cancer x 2, lung cancer    Paternal [place] Mother: deceased, Colon Cancer, diagnosed with Colon cancer    Sister 1: alive    1 sister(s) . 1 son(s) , 2 daughter(s) .    No family history of colon polyps or liver disease.  Social History: General Tobacco use cigarettes: Never smoked, Tobacco history last updated 05/17/2017.  no EXPOSURE TO PASSIVE SMOKE, no.  Alcohol: yes, occasionally.  Caffeine: yes, soda, tea.  no Recreational drug use.  Marital Status: single, Divorced, now engaged.  Children: 1 son, 2 dtrs; 4 grandkids.  OCCUPATION: employed, [employer].  3 grandkids live with her.  ROS: CONSTITUTIONAL No" options="no,yes" propid="91" itemid="193425" categoryid="10464" encounterid="[id number]"Chills No. No" options="no,yes" propid="91" itemid="172899" categoryid="10464" encounterid="[id number]"Fatigue No. No" options="no,yes" propid="91" itemid="10467" categoryid="10464" encounterid="[id number]"Fever No. No" options="no,yes" propid="91" itemid="193426" categoryid="10464" encounterid="[id number]"Night sweats No. No" options="no,yes" propid="91" itemid="444261" categoryid="10464" encounterid="[id number]"Recent travel outside [place] No. No" options="no,yes" propid="91" itemid="193427" categoryid="10464" encounterid="10305280"Sweats No. No" options="no,yes" propid="91" itemid="194825" categoryid="10464" encounterid="[id number]"Weight change No.  OPHTHALMOLOGY no" options="no,yes" propid="91" itemid="12520" categoryid="12516" encounterid="[id number]"Blurring of vision no. no" options="no,yes" propid="91" itemid="193469" categoryid="12516" encounterid="10305280"Change in vision no. no" options="no,yes" propid="91" itemid="194379" categoryid="12516" encounterid="[id number]"Double vision no.  ENT no" options="no,yes" propid="91" itemid="193612" categoryid="10481" encounterid="[id number]"Dizziness no. Nose bleeds no. Sore  throat no. Teeth pain no.  ALLERGY no" options="no,yes" propid="91" itemid="202589" categoryid="138152" encounterid="10305280"Hives no.  CARDIOLOGY no" options="no,yes" propid="91" itemid="193603" categoryid="10488" encounterid="10305280"Chest pain no. no" options="no,yes"  propid="91" itemid="199089" categoryid="10488" encounterid="[id number]"High blood pressure no. no" options="no,yes" propid="91" itemid="202598" categoryid="10488" encounterid="10305280"Irregular heart beat no. no" options="no,yes" propid="91" itemid="10491" categoryid="10488" encounterid="10305280"Leg edema no. no" options="no,yes" propid="91" itemid="10490" categoryid="10488" encounterid="10305280"Palpitations no.  RESPIRATORY no" options="no" propid="91" itemid="270013" categoryid="138132" encounterid="10305280"Shortness of breath no. no" options="no,yes" propid="91" itemid="172745" categoryid="138132" encounterid="10305280"Cough no. no" options="no,yes" propid="91" itemid="193621" categoryid="138132" encounterid="10305280"Wheezing no.  UROLOGY no" options="no,yes" propid="91" [phone]" categoryid="138166" encounterid="10305280"Pain with urination no. no" options="no,yes" propid="91" itemid="193493" categoryid="138166" encounterid="10305280"Urinary urgency no. no" options="no,yes" propid="91" itemid="193492" categoryid="138166" encounterid="10305280"Urinary frequency no. yes" options="no,yes" propid="91" itemid="138171" categoryid="138166" encounterid="10305280"Urinary incontinence yes. No" options="no,yes" propid="91" itemid="138167" categoryid="138166" encounterid="10305280"Difficulty urinating No. No" options="no,yes" propid="91" itemid="138168" categoryid="138166" encounterid="10305280"Blood in urine No.  GASTROENTEROLOGY no" options="no,yes" propid="91" itemid="10496" categoryid="10494" encounterid="10305280"Abdominal pain no. no" options="no,yes" propid="91" itemid="193447" categoryid="10494" encounterid="10305280"Appetite  change no. no" options="no,yes" propid="91" itemid="193448" categoryid="10494" encounterid="10305280"Bloating/belching no. no" options="no,yes" propid="91" itemid="10503" categoryid="10494" encounterid="10305280"Blood in stool or on toilet paper no. no" options="no,yes" propid="91" itemid="199106" categoryid="10494" encounterid="10305280"Change in bowel movements no. no" options="no,yes" propid="91" itemid="10501" categoryid="10494" encounterid="10305280"Constipation no. no" options="no,yes" propid="91" itemid="10502" categoryid="10494" encounterid="10305280"Diarrhea no. no" options="no,yes" propid="91" itemid="199104" categoryid="10494" encounterid="10305280"Difficulty swallowing no. no" options="no,yes" propid="91" itemid="10499" categoryid="10494" encounterid="10305280"Nausea no.  FEMALE REPRODUCTIVE no" options="no,yes" propid="91" itemid="453725" categoryid="10525" encounterid="10305280"Vulvar pain no. no" options="no,yes" propid="91" itemid="453726" categoryid="10525" encounterid="10305280"Vulvar rash no. yes" options="no, yes" propid="91" itemid="444315" categoryid="10525" encounterid="10305280"Abnormal vaginal bleeding yes. no" options="no,yes" propid="91" itemid="186083" categoryid="10525" encounterid="10305280"Breast pain no. no" options="no,yes" propid="91" itemid="186084" categoryid="10525" encounterid="10305280"Nipple discharge no. no" options="no,yes" propid="91" itemid="275823" categoryid="10525" encounterid="10305280"Pain with intercourse no. no" options="no,yes" propid="91" itemid="186082" categoryid="10525" encounterid="10305280"Pelvic pain no. no" options="no,yes" propid="91" itemid="278230" categoryid="10525" encounterid="10305280"Unusual vaginal discharge no. no" options="no,yes" propid="91" itemid="278942" categoryid="10525" encounterid="10305280"Vaginal itching no.  MUSCULOSKELETAL no" options="no,yes" propid="91" itemid="193461" categoryid="10514" encounterid="10305280"Muscle aches no.    NEUROLOGY no" options="no,yes" propid="91" itemid="12513" categoryid="12512" encounterid="10305280"Headache no. no" options="no,yes" propid="91" itemid="12514" categoryid="12512" encounterid="10305280"Tingling/numbness no. no" options="no,yes" propid="91" itemid="193468" categoryid="12512" encounterid="10305280"Weakness no.  PSYCHOLOGY no" options="" propid="91" itemid="275919" categoryid="10520" encounterid="10305280"Depression no. no" options="no,yes" propid="91" itemid="172748" categoryid="10520" encounterid="10305280"Anxiety no. no" options="no,yes" propid="91" itemid="199158" categoryid="10520" encounterid="10305280"Nervousness no. no" options="no,yes" propid="91" itemid="12502" categoryid="10520" encounterid="10305280"Sleep disturbances no. no " options="no,yes" propid="91" itemid="72718" categoryid="10520" encounterid="10305280"Suicidal ideation no .  ENDOCRINOLOGY no" options="no,yes" propid="91" itemid="194628" categoryid="12508" encounterid="10305280"Excessive thirst no. no" options="no,yes" propid="91" itemid="196285" categoryid="12508" encounterid="10305280"Excessive urination no. no" options="no, yes" propid="91" itemid="444314" categoryid="12508" encounterid="10305280"Hair loss no. no" options="" propid="91" itemid="447284" categoryid="12508" encounterid="10305280"Heat or cold intolerance no.  HEMATOLOGY/LYMPH no" options="no,yes" propid="91" itemid="199152" categoryid="138157" encounterid="10305280"Abnormal bleeding no. no" options="no,yes" propid="91" itemid="170653" categoryid="138157" encounterid="10305280"Easy bruising no. no" options="no,yes" propid="91" itemid="138158" categoryid="138157" encounterid="10305280"Swollen glands no.  DERMATOLOGY no" options="no,yes" propid="91" itemid="199126" categoryid="12503" encounterid="10305280"New/changing skin lesion no. no" options="no,yes" propid="91" itemid="12504" categoryid="12503" encounterid="10305280"Rash no. no" options="" propid="91"  itemid="444313" categoryid="12503" encounterid="[id number]"Sores no.   Negative except as stated in HPI.  Objective: Vitals: Wt 170, Wt change 2 lb, Ht 62, BMI 31.09, Pulse sitting 69, BP sitting 102/70  Past Results: Examination:  General Examination alert, oriented, NAD " categoryPropId="10089" examid="193638"CONSTITUTIONAL: alert, oriented, NAD .  moist, warm" categoryPropId="10109" examid="193638"SKIN: moist, warm.  Conjunctiva clear" categoryPropId="21468" examid="193638"EYES: Conjunctiva clear.  good I:E efffort noted, clear to auscultation bilaterally" categoryPropId="87" examid="193638"LUNGS: good I:E efffort noted, clear to auscultation bilaterally.  regular rate and rhythm" categoryPropId="86" examid="193638"HEART: regular rate and rhythm.  soft, non-tender/non-distended, bowel sounds present " categoryPropId="88" examid="193638"ABDOMEN: soft, non-tender/non-distended, bowel sounds present .  normal external genitalia, labia - unremarkable, vagina - pink moist mucosa, no lesions or abnormal discharge, cervix - no discharge or lesions or CMT, adnexa - no masses or tenderness, uterus - nontender and normal size on palpation " categoryPropId="13414" examid="193638"FEMALE GENITOURINARY: normal external genitalia, labia - unremarkable, vagina - pink moist mucosa, no lesions or abnormal discharge, cervix - no discharge or lesions or CMT,  adnexa - no masses or tenderness, uterus - nontender and normal size on palpation .  affect normal, good eye contact" categoryPropId="16316" examid="193638"PSYCH: affect normal, good eye contact.  Physical Examination:    Assessment: Assessment:  Fibroids, intramural - D25.1 (Primary)     Abnormal uterine bleeding - N93.9     Stress incontinence - N39.3     Plan: Treatment: Fibroids, intramural Notes: r/b/a of vaginal hysterectomy with bilateral salpingectomy discussed with the patient. including but not limited to infection/ bleeding / damage to  bowel bladder ureters as well as conversion to laparotomy with the need for further surgery. pt voiced understanding and would like to proceed with robotic assisted laparoscopic hysterectomy with bilateral salpingectomy.. r/o trasnfusion discussed. HIV/ HEP B and C. Abnormal uterine bleeding Notes: r/b/a of vaginal hysterectomy with bilateral salpingectomy discussed with the patient. including but not limited to infection/ bleeding / damage to bowel bladder ureters as well as conversion to laparotomy with the need for further surgery. pt voiced understanding and would like to proceed with robotic assisted laparoscopic hysterectomy with bilateral salpingectomy.. r/o trasnfusion discussed. HIV/ HEP B and C. Stress incontinence Notes: Dr. Mechele Collin is planning sling procedure immediately following vaginal hysterectomy.

## 2017-05-31 NOTE — H&P (Signed)
I was consulted by the above provider to assess the patient's worsening stress incontinence. She leaks with coughing sneezing and at the gym. 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 She was wearing a tampon for activity. She denies bedwetting and urge incontinence   She voids every 1-2 hours depending on fluid intake and has no nocturia. She reports a good flow   It appears she may be planning a hysterectomy for abnormal bleeding and endometriosis and other pathology.   Mild grade 2 hypermobility the bladder neck and a mild positive cough test with no prolapse   The patient has milder stress incontinence and moderate frequency. The role of urodynamics discussed. The role of physical therapy discussed. I did mention the future study.   Urodynamics ordered. Incontinence has worsened in the last urine she did not use physical therapy. She will think about the study. She obviously is influenced by the fact that she may be having a hysterectomy. We will continue to present all options. I will send a note to the above provider   TOday  Frequency and incontinence are stable  She emptied efficiently on urodynamics. Bladder capacity was 700 mL. Bladder was stable. Her Valsalva leak point pressure at 200 mL with 73 cm of water. At 400 mL for cough leak point pressure was 66 cm of water and was moderate in severity. She had difficulty voiding in the lab setting. She may generate a small detrusor contraction of 5 cm of water. She was not able to void. Bladder neck descended 1-2 cm. EMG activity was within normal limits. Her bladder was a bit hypersensitive. The details of the urodynamics are signing dictated.   Watchful waiting versus a sling discussed. Mesh issue discussed. Theoretical increased risk of retention discussed.   I do believe the patient would like to proceed with surgery. I will  wait to hear from Dr. Landry Mellow to schedule. I believe the patient was pleased with our discussion     ALLERGIES: codeine - Skin Rash Sulfa (Sulfonamide Antibiotics) - Skin Rash    MEDICATIONS: No Medications    GU PSH: Complex cystometrogram, w/ void pressure and urethral pressure profile studies, any technique - 02/23/2017 Complex Uroflow - 02/23/2017 Emg surf Electrd - 02/23/2017 Inject For cystogram - 02/23/2017 Intrabd voidng Press - 02/23/2017      PSH Notes: Arm-Radial Nerve Release    NON-GU PSH: Bilateral Tubal Ligation - 1992 Cholecystectomy (laparoscopic) - 2002    GU PMH: Stress Incontinence - 02/10/2017 Urinary Frequency - 02/10/2017    NON-GU PMH: No Non-GU PMH    FAMILY HISTORY: 2 daughters - Other 1 son - Other Breast Cancer - Mother Lung Cancer - Mother mother deceased at age 37 - Other Patient's father is still living - Other   SOCIAL HISTORY: Marital Status: Divorced Current Smoking Status: Patient has never smoked.   Tobacco Use Assessment Completed: Used Tobacco in last 30 days? Social Drinker.  Drinks 2 caffeinated drinks per day. Patient's occupation Publishing copy.    REVIEW OF SYSTEMS:    GU Review Female:   Patient denies frequent urination, hard to postpone urination, burning /pain with urination, get up at night to urinate, leakage of urine, stream starts and stops, trouble starting your stream, have to strain to urinate, and being pregnant.  Gastrointestinal (Upper):   Patient denies nausea, vomiting, and indigestion/ heartburn.  Gastrointestinal (Lower):   Patient denies diarrhea and constipation.  Constitutional:   Patient denies weight loss, night sweats, fatigue, and fever.  Skin:   Patient denies skin rash/ lesion and itching.  Eyes:   Patient denies blurred vision and double vision.  Ears/ Nose/ Throat:   Patient denies sore throat and sinus problems.  Hematologic/Lymphatic:   Patient denies swollen glands and easy bruising.   Cardiovascular:   Patient denies leg swelling and chest pains.  Respiratory:   Patient denies cough and shortness of breath.  Endocrine:   Patient denies excessive thirst.  Musculoskeletal:   Patient denies back pain and joint pain.  Neurological:   Patient denies headaches and dizziness.  Psychologic:   Patient denies depression and anxiety.   VITAL SIGNS: None   PAST DATA REVIEWED:  Source Of History:  Patient   PROCEDURES: None   ASSESSMENT:      ICD-10 Details  1 GU:   Stress Incontinence - N39.3   2   Urinary Frequency - R35.0               Notes:   We talked about a sling in detail. Pros, cons, general surgical and anesthetic risks, and other options including behavioral therapy and watchful waiting were discussed. She understands that slings are generally successful in 90% of cases for stress incontinence, 50% for urge incontinence, and that in a small % of cases the incontinence can worsen. The risk of persistent, de novo, or worsening incontinence/dysfunction was discussed. Risks were described but not limited to the discussion of injury to neighboring structures including the bowel (with possible life-threatening sepsis and colostomy), bladder, urethra, vagina (all resulting in further surgery), and ureter (resulting in re-implantation). We also talked about the risk of retention requiring urethrolysis, extrusion requiring revision, and erosion resulting in further surgery. Bleeding risks and transfusion rates and the risk of infection were discussed. The risk of pelvic and abdominal pain syndromes, dyspareunia, and neuropathies were discussed. The need for CIC was described as well the usual post-operative course. The patient understands that she might not reach her treatment goal and that she might be worse following surgery.    PLAN:           Schedule Return Visit/Planned Activity: Return PRN - Office Visit   After a thorough review of the management options for the  patient's condition the patient  elected to proceed with surgical therapy as noted above. We have discussed the potential benefits and risks of the procedure, side effects of the proposed treatment, the likelihood of the patient achieving the goals of the procedure, and any potential problems that might occur during the procedure or recuperation. Informed consent has been obtained.

## 2017-06-01 ENCOUNTER — Encounter (HOSPITAL_COMMUNITY): Payer: Self-pay

## 2017-06-01 ENCOUNTER — Encounter (HOSPITAL_COMMUNITY)
Admission: RE | Disposition: A | Discharge status: Home / Self Care | Payer: Self-pay | Source: Ambulatory Visit | Attending: Obstetrics and Gynecology

## 2017-06-01 ENCOUNTER — Ambulatory Visit (HOSPITAL_COMMUNITY): Payer: No Typology Code available for payment source | Admitting: Anesthesiology

## 2017-06-01 ENCOUNTER — Observation Stay (HOSPITAL_COMMUNITY)
Admission: RE | Admit: 2017-06-01 | Discharge: 2017-06-02 | Disposition: A | Discharge status: Home / Self Care | Payer: No Typology Code available for payment source | Source: Ambulatory Visit | Attending: Obstetrics and Gynecology | Admitting: Obstetrics and Gynecology

## 2017-06-01 ENCOUNTER — Other Ambulatory Visit: Payer: Self-pay

## 2017-06-01 DIAGNOSIS — Z90710 Acquired absence of both cervix and uterus: Secondary | ICD-10-CM | POA: Diagnosis present

## 2017-06-01 DIAGNOSIS — N888 Other specified noninflammatory disorders of cervix uteri: Secondary | ICD-10-CM | POA: Insufficient documentation

## 2017-06-01 DIAGNOSIS — N393 Stress incontinence (female) (male): Secondary | ICD-10-CM | POA: Diagnosis not present

## 2017-06-01 DIAGNOSIS — N800 Endometriosis of uterus: Secondary | ICD-10-CM | POA: Diagnosis not present

## 2017-06-01 DIAGNOSIS — D251 Intramural leiomyoma of uterus: Principal | ICD-10-CM | POA: Insufficient documentation

## 2017-06-01 DIAGNOSIS — N921 Excessive and frequent menstruation with irregular cycle: Secondary | ICD-10-CM | POA: Insufficient documentation

## 2017-06-01 HISTORY — PX: BILATERAL SALPINGECTOMY: SHX5743

## 2017-06-01 HISTORY — PX: PUBOVAGINAL SLING: SHX1035

## 2017-06-01 HISTORY — PX: CYSTOSCOPY: SHX5120

## 2017-06-01 HISTORY — PX: VAGINAL HYSTERECTOMY: SHX2639

## 2017-06-01 LAB — CBC
HEMATOCRIT: 35.1 % — AB (ref 36.0–46.0)
HEMOGLOBIN: 11.8 g/dL — AB (ref 12.0–15.0)
MCH: 29.2 pg (ref 26.0–34.0)
MCHC: 33.6 g/dL (ref 30.0–36.0)
MCV: 86.9 fL (ref 78.0–100.0)
Platelets: 250 10*3/uL (ref 150–400)
RBC: 4.04 MIL/uL (ref 3.87–5.11)
RDW: 13.5 % (ref 11.5–15.5)
WBC: 16.7 10*3/uL — AB (ref 4.0–10.5)

## 2017-06-01 SURGERY — HYSTERECTOMY, VAGINAL
Anesthesia: General

## 2017-06-01 MED ORDER — SENNA 8.6 MG PO TABS
1.0000 | ORAL_TABLET | Freq: Two times a day (BID) | ORAL | Status: DC
  Administered 2017-06-01 – 2017-06-02 (×2): 8.6 mg via ORAL
  Filled 2017-06-01 (×3): qty 1

## 2017-06-01 MED ORDER — METOCLOPRAMIDE HCL 5 MG/ML IJ SOLN
INTRAMUSCULAR | Status: AC
  Filled 2017-06-01: qty 2

## 2017-06-01 MED ORDER — PHENYLEPHRINE 40 MCG/ML (10ML) SYRINGE FOR IV PUSH (FOR BLOOD PRESSURE SUPPORT)
PREFILLED_SYRINGE | INTRAVENOUS | Status: AC
  Filled 2017-06-01: qty 10

## 2017-06-01 MED ORDER — PROPOFOL 10 MG/ML IV BOLUS
INTRAVENOUS | Status: DC | PRN
  Administered 2017-06-01: 180 mg via INTRAVENOUS

## 2017-06-01 MED ORDER — ONDANSETRON HCL 4 MG/2ML IJ SOLN
INTRAMUSCULAR | Status: AC
  Filled 2017-06-01: qty 2

## 2017-06-01 MED ORDER — LIDOCAINE-EPINEPHRINE (PF) 1 %-1:200000 IJ SOLN
INTRAMUSCULAR | Status: DC | PRN
  Administered 2017-06-01: 3 mL

## 2017-06-01 MED ORDER — SIMETHICONE 80 MG PO CHEW: 80.0000 mg | CHEWABLE_TABLET | Freq: Four times a day (QID) | ORAL | Status: DC | PRN

## 2017-06-01 MED ORDER — PHENYLEPHRINE HCL 10 MG/ML IJ SOLN
INTRAMUSCULAR | Status: DC | PRN
  Administered 2017-06-01: 80 ug via INTRAVENOUS

## 2017-06-01 MED ORDER — IBUPROFEN 800 MG PO TABS: 800.0000 mg | ORAL_TABLET | Freq: Three times a day (TID) | ORAL | Status: DC | PRN

## 2017-06-01 MED ORDER — METOCLOPRAMIDE HCL 5 MG/ML IJ SOLN
INTRAMUSCULAR | Status: DC | PRN
  Administered 2017-06-01: 10 mg via INTRAVENOUS

## 2017-06-01 MED ORDER — DEXAMETHASONE SODIUM PHOSPHATE 10 MG/ML IJ SOLN
INTRAMUSCULAR | Status: DC | PRN
  Administered 2017-06-01: 10 mg via INTRAVENOUS

## 2017-06-01 MED ORDER — ESTRADIOL 0.1 MG/GM VA CREA
TOPICAL_CREAM | VAGINAL | Status: DC | PRN
  Administered 2017-06-01: 1 via VAGINAL

## 2017-06-01 MED ORDER — STERILE WATER FOR IRRIGATION IR SOLN
Status: DC | PRN
  Administered 2017-06-01: 1000 mL via INTRAVESICAL

## 2017-06-01 MED ORDER — ONDANSETRON HCL 4 MG PO TABS: 4.0000 mg | ORAL_TABLET | Freq: Four times a day (QID) | ORAL | Status: DC | PRN

## 2017-06-01 MED ORDER — LIDOCAINE-EPINEPHRINE 1 %-1:100000 IJ SOLN
INTRAMUSCULAR | Status: AC
  Filled 2017-06-01: qty 2

## 2017-06-01 MED ORDER — SCOPOLAMINE 1 MG/3DAYS TD PT72
1.0000 | MEDICATED_PATCH | Freq: Once | TRANSDERMAL | Status: DC
  Administered 2017-06-01: 1.5 mg via TRANSDERMAL

## 2017-06-01 MED ORDER — LIDOCAINE HCL (CARDIAC) PF 100 MG/5ML IV SOSY
PREFILLED_SYRINGE | INTRAVENOUS | Status: AC
  Filled 2017-06-01: qty 5

## 2017-06-01 MED ORDER — FENTANYL CITRATE (PF) 250 MCG/5ML IJ SOLN
INTRAMUSCULAR | Status: AC
  Filled 2017-06-01: qty 5

## 2017-06-01 MED ORDER — DIPHENHYDRAMINE HCL 50 MG/ML IJ SOLN
INTRAMUSCULAR | Status: DC | PRN
  Administered 2017-06-01: 12.5 mg via INTRAVENOUS

## 2017-06-01 MED ORDER — HYDROMORPHONE HCL 1 MG/ML IJ SOLN: 0.2000 mg | INTRAMUSCULAR | Status: DC | PRN

## 2017-06-01 MED ORDER — LIDOCAINE-EPINEPHRINE (PF) 1 %-1:200000 IJ SOLN
INTRAMUSCULAR | Status: AC
  Filled 2017-06-01: qty 30

## 2017-06-01 MED ORDER — PHENAZOPYRIDINE HCL 200 MG PO TABS
200.0000 mg | ORAL_TABLET | ORAL | Status: AC
  Administered 2017-06-01: 200 mg via ORAL
  Filled 2017-06-01: qty 1

## 2017-06-01 MED ORDER — SUGAMMADEX SODIUM 200 MG/2ML IV SOLN
INTRAVENOUS | Status: DC | PRN
  Administered 2017-06-01: 155 mg via INTRAVENOUS

## 2017-06-01 MED ORDER — DEXAMETHASONE SODIUM PHOSPHATE 10 MG/ML IJ SOLN
INTRAMUSCULAR | Status: AC
Start: 2017-06-01 — End: ?
  Filled 2017-06-01: qty 1

## 2017-06-01 MED ORDER — MIDAZOLAM HCL 2 MG/2ML IJ SOLN
INTRAMUSCULAR | Status: DC | PRN
  Administered 2017-06-01: 2 mg via INTRAVENOUS

## 2017-06-01 MED ORDER — CIPROFLOXACIN IN D5W 400 MG/200ML IV SOLN
400.0000 mg | INTRAVENOUS | Status: AC
  Administered 2017-06-01: 400 mg via INTRAVENOUS
  Filled 2017-06-01: qty 200

## 2017-06-01 MED ORDER — DEXAMETHASONE SODIUM PHOSPHATE 10 MG/ML IJ SOLN
INTRAMUSCULAR | Status: AC
  Filled 2017-06-01: qty 1

## 2017-06-01 MED ORDER — TEMAZEPAM 15 MG PO CAPS: 15.0000 mg | ORAL_CAPSULE | Freq: Every evening | ORAL | Status: DC | PRN

## 2017-06-01 MED ORDER — SCOPOLAMINE 1 MG/3DAYS TD PT72
MEDICATED_PATCH | TRANSDERMAL | Status: AC
  Administered 2017-06-01: 1.5 mg via TRANSDERMAL
  Filled 2017-06-01: qty 1

## 2017-06-01 MED ORDER — TRAMADOL HCL 50 MG PO TABS: 50.0000 mg | ORAL_TABLET | Freq: Four times a day (QID) | ORAL | Status: DC | PRN

## 2017-06-01 MED ORDER — ONDANSETRON HCL 4 MG/2ML IJ SOLN: 4.0000 mg | Freq: Four times a day (QID) | INTRAMUSCULAR | Status: DC | PRN

## 2017-06-01 MED ORDER — PROMETHAZINE HCL 25 MG/ML IJ SOLN: 6.2500 mg | INTRAMUSCULAR | Status: DC | PRN

## 2017-06-01 MED ORDER — MENTHOL 3 MG MT LOZG: 1.0000 | LOZENGE | OROMUCOSAL | Status: DC | PRN

## 2017-06-01 MED ORDER — LIDOCAINE HCL (CARDIAC) PF 100 MG/5ML IV SOSY
PREFILLED_SYRINGE | INTRAVENOUS | Status: DC | PRN
  Administered 2017-06-01: 60 mg via INTRAVENOUS

## 2017-06-01 MED ORDER — LIDOCAINE-EPINEPHRINE 1 %-1:100000 IJ SOLN
INTRAMUSCULAR | Status: DC | PRN
  Administered 2017-06-01: 9 mL
  Administered 2017-06-01: 11 mL

## 2017-06-01 MED ORDER — CEFOTETAN DISODIUM-DEXTROSE 2-2.08 GM-%(50ML) IV SOLR
2.0000 g | INTRAVENOUS | Status: AC
  Administered 2017-06-01: 2 g via INTRAVENOUS

## 2017-06-01 MED ORDER — ALUM & MAG HYDROXIDE-SIMETH 200-200-20 MG/5ML PO SUSP: 30.0000 mL | ORAL | Status: DC | PRN

## 2017-06-01 MED ORDER — MIDAZOLAM HCL 2 MG/2ML IJ SOLN
INTRAMUSCULAR | Status: AC
  Filled 2017-06-01: qty 2

## 2017-06-01 MED ORDER — BUPIVACAINE HCL (PF) 0.25 % IJ SOLN
INTRAMUSCULAR | Status: AC
  Filled 2017-06-01: qty 30

## 2017-06-01 MED ORDER — ROCURONIUM BROMIDE 100 MG/10ML IV SOLN
INTRAVENOUS | Status: DC | PRN
  Administered 2017-06-01: 40 mg via INTRAVENOUS
  Administered 2017-06-01 (×3): 10 mg via INTRAVENOUS

## 2017-06-01 MED ORDER — SUGAMMADEX SODIUM 200 MG/2ML IV SOLN
INTRAVENOUS | Status: AC
  Filled 2017-06-01: qty 2

## 2017-06-01 MED ORDER — LACTATED RINGERS IV SOLN
INTRAVENOUS | Status: DC
  Administered 2017-06-01 (×2): via INTRAVENOUS
  Administered 2017-06-01: 1000 mL via INTRAVENOUS

## 2017-06-01 MED ORDER — LACTATED RINGERS IV SOLN
INTRAVENOUS | Status: DC
  Administered 2017-06-01 – 2017-06-02 (×2): 125 mL/h via INTRAVENOUS

## 2017-06-01 MED ORDER — ONDANSETRON HCL 4 MG/2ML IJ SOLN
INTRAMUSCULAR | Status: DC | PRN
  Administered 2017-06-01: 4 mg via INTRAVENOUS

## 2017-06-01 MED ORDER — CEFOTETAN DISODIUM-DEXTROSE 2-2.08 GM-%(50ML) IV SOLR
INTRAVENOUS | Status: AC
  Filled 2017-06-01: qty 50

## 2017-06-01 MED ORDER — ROCURONIUM BROMIDE 100 MG/10ML IV SOLN
INTRAVENOUS | Status: AC
  Filled 2017-06-01: qty 2

## 2017-06-01 MED ORDER — ESTROGENS, CONJUGATED 0.625 MG/GM VA CREA
TOPICAL_CREAM | VAGINAL | Status: AC
  Filled 2017-06-01: qty 30

## 2017-06-01 MED ORDER — DIPHENHYDRAMINE HCL 50 MG/ML IJ SOLN
INTRAMUSCULAR | Status: AC
  Filled 2017-06-01: qty 1

## 2017-06-01 MED ORDER — HYDROMORPHONE HCL 1 MG/ML IJ SOLN: 0.2500 mg | INTRAMUSCULAR | Status: DC | PRN

## 2017-06-01 MED ORDER — PROPOFOL 10 MG/ML IV BOLUS
INTRAVENOUS | Status: AC
  Filled 2017-06-01: qty 20

## 2017-06-01 MED ORDER — FENTANYL CITRATE (PF) 100 MCG/2ML IJ SOLN
INTRAMUSCULAR | Status: DC | PRN
  Administered 2017-06-01 (×2): 50 ug via INTRAVENOUS
  Administered 2017-06-01: 100 ug via INTRAVENOUS

## 2017-06-01 SURGICAL SUPPLY — 63 items
ADH SKN CLS APL DERMABOND .7 (GAUZE/BANDAGES/DRESSINGS) ×2
ADH SKN CLS LQ APL DERMABOND (GAUZE/BANDAGES/DRESSINGS) ×2
BLADE SURG 15 STRL LF C SS BP (BLADE) ×2 IMPLANT
BLADE SURG 15 STRL SS (BLADE) ×4
CANISTER SUCT 3000ML PPV (MISCELLANEOUS) ×8 IMPLANT
CATH ROBINSON RED A/P 16FR (CATHETERS) IMPLANT
CONT PATH 16OZ SNAP LID 3702 (MISCELLANEOUS) IMPLANT
COVER MAYO STAND STRL (DRAPES) ×4 IMPLANT
DECANTER SPIKE VIAL GLASS SM (MISCELLANEOUS) ×2 IMPLANT
DERMABOND ADHESIVE PROPEN (GAUZE/BANDAGES/DRESSINGS) ×2
DERMABOND ADVANCED (GAUZE/BANDAGES/DRESSINGS) ×2
DERMABOND ADVANCED .7 DNX12 (GAUZE/BANDAGES/DRESSINGS) ×2 IMPLANT
DERMABOND ADVANCED .7 DNX6 (GAUZE/BANDAGES/DRESSINGS) IMPLANT
DEVICE CAPIO SLIM SINGLE (INSTRUMENTS) IMPLANT
DRAIN PENROSE 1/4X12 LTX (DRAIN) ×2 IMPLANT
DRAPE SHEET LG 3/4 BI-LAMINATE (DRAPES) IMPLANT
DRAPE STERI URO 9X17 APER PCH (DRAPES) ×2 IMPLANT
DRAPE UNDERBUTTOCKS STRL (DRAPE) ×4 IMPLANT
GAUZE PACKING 1 X5 YD ST (GAUZE/BANDAGES/DRESSINGS) ×2 IMPLANT
GAUZE PACKING 2X5 YD STRL (GAUZE/BANDAGES/DRESSINGS) IMPLANT
GAUZE SPONGE 4X4 16PLY XRAY LF (GAUZE/BANDAGES/DRESSINGS) ×2 IMPLANT
GLOVE BIO SURGEON STRL SZ7.5 (GLOVE) ×4 IMPLANT
GLOVE BIOGEL M 6.5 STRL (GLOVE) ×8 IMPLANT
GLOVE BIOGEL PI IND STRL 6.5 (GLOVE) ×4 IMPLANT
GLOVE BIOGEL PI IND STRL 7.0 (GLOVE) ×4 IMPLANT
GLOVE BIOGEL PI IND STRL 8 (GLOVE) ×4 IMPLANT
GLOVE BIOGEL PI INDICATOR 6.5 (GLOVE) ×4
GLOVE BIOGEL PI INDICATOR 7.0 (GLOVE) ×4
GLOVE BIOGEL PI INDICATOR 8 (GLOVE) ×4
GOWN STRL REUS W/TWL LRG LVL3 (GOWN DISPOSABLE) ×16 IMPLANT
LIGASURE IMPACT 36 18CM CVD LR (INSTRUMENTS) ×2 IMPLANT
NDL SPNL 22GX3.5 QUINCKE BK (NEEDLE) IMPLANT
NEEDLE HYPO 22GX1.5 SAFETY (NEEDLE) IMPLANT
NEEDLE SPNL 22GX3.5 QUINCKE BK (NEEDLE) IMPLANT
NS IRRIG 1000ML POUR BTL (IV SOLUTION) ×8 IMPLANT
PACK VAGINAL WOMENS (CUSTOM PROCEDURE TRAY) ×6 IMPLANT
PAD OB MATERNITY 4.3X12.25 (PERSONAL CARE ITEMS) ×4 IMPLANT
PENCIL BUTTON HOLSTER BLD 10FT (ELECTRODE) ×4 IMPLANT
PLUG CATH AND CAP STER (CATHETERS) ×4 IMPLANT
RETRACTOR STAY HOOK 5MM (MISCELLANEOUS) IMPLANT
SET CYSTO W/LG BORE CLAMP LF (SET/KITS/TRAYS/PACK) ×4 IMPLANT
SHEET LAVH (DRAPES) ×4 IMPLANT
SLING SUPRIS RETROPUBIC KIT (Miscellaneous) ×2 IMPLANT
SUT CAPIO ETHIBPND (SUTURE) IMPLANT
SUT CHROMIC 2 0 CT 1 (SUTURE) ×8 IMPLANT
SUT SILK 2 0 PERMA HAND 18 BK (SUTURE) IMPLANT
SUT VIC AB 0 CT1 18XCR BRD8 (SUTURE) ×6 IMPLANT
SUT VIC AB 0 CT1 27 (SUTURE)
SUT VIC AB 0 CT1 27XBRD ANBCTR (SUTURE) IMPLANT
SUT VIC AB 0 CT1 36 (SUTURE) ×8 IMPLANT
SUT VIC AB 0 CT1 8-18 (SUTURE) ×12
SUT VIC AB 2-0 CT1 (SUTURE) ×4 IMPLANT
SUT VIC AB 2-0 CT2 27 (SUTURE) IMPLANT
SUT VIC AB 2-0 SH 27 (SUTURE)
SUT VIC AB 2-0 SH 27XBRD (SUTURE) IMPLANT
SUT VIC AB 4-0 PS2 27 (SUTURE) ×4 IMPLANT
SUT VICRYL 1 TIES 12X18 (SUTURE) ×4 IMPLANT
SUT VICRYL 4-0 PS2 18IN ABS (SUTURE) ×4 IMPLANT
SYR BULB IRRIGATION 50ML (SYRINGE) ×4 IMPLANT
TOWEL OR 17X24 6PK STRL BLUE (TOWEL DISPOSABLE) ×16 IMPLANT
TRAY FOLEY W/BAG SLVR 14FR (SET/KITS/TRAYS/PACK) ×8 IMPLANT
TUBING NON-CON 1/4 X 20 CONN (TUBING) ×3 IMPLANT
TUBING NON-CON 1/4 X 20' CONN (TUBING) ×1

## 2017-06-01 NOTE — Op Note (Signed)
06/01/2017  1:59 PM  PATIENT:  Sally Brown  52 y.o. female  PRE-OPERATIVE DIAGNOSIS:  Menorrhagia with irregular cycle N92.1 Fibroids, intramural D25.1  POST-OPERATIVE DIAGNOSIS:  Menorrhagia with irregular cycle N92.1  PROCEDURE:  Procedure(s): HYSTERECTOMY VAGINAL (N/A) BILATERAL SALPINGECTOMY (Bilateral) CYSTOSCOPY (N/A) PUBO-VAGINAL SLING (N/A)  SURGEON:  Surgeon(s) and Role: Panel 1:    Christophe Louis, MD - Primary    * Thurnell Lose, MD - Assisting Panel 2:    * Bjorn Loser, MD - Primary  PHYSICIAN ASSISTANT: None  ASSISTANTS: see above    ANESTHESIA:   general  EBL:  245 mL   BLOOD ADMINISTERED:none  DRAINS: Urinary Catheter (Foley)   LOCAL MEDICATIONS USED:  XYLOCAINE   SPECIMEN:  Source of Specimen:  uterus cervix and bilateral fallopian tubes   DISPOSITION OF SPECIMEN:  PATHOLOGY  COUNTS:  YES  TOURNIQUET:  * No tourniquets in log *  DICTATION: .Dragon Dictation  PLAN OF CARE: Admit for overnight observation  PATIENT DISPOSITION:  PACU - hemodynamically stable.   Delay start of Pharmacological VTE agent (>24hrs) due to surgical blood loss or risk of bleeding: not applicable  Finding: normal external genitalia.. Grade 2 uterine prolapse .Marland Kitchen Small cystocele.. Enlarged fibroid uterus.. Normal ovaries and fallopian tubes bilaterally.   Procedure: the patient was taken to the operating room placed under general anesthesia. Prepped and draped in the normal sterile fashion. A foley catheter was placed. A weighted speculum was placed in to the vagina and the cervix was grasped with a toothed tenaculum. The cervix was then injected circumferentially with 1% xylocaine with 1:100K of epinephrine. The cerix was then circumferentially incised with the bovie and the bladder was dissected off the pubovesical cervical fascia. The anterior cul-de-sac as entered sharply. The same procedure was performed posteriorly and the posterior cu-lde-sac was entered  sharply without difficulty. A heany clamp was placed over the uterosacral ligaments bilaterally., These were transected and suture ligated with 0 vicryl. The cardinal ligaments were then clamped with the ligasure cauterized and transected bilaterally.  The uterine arteries and the broad ligament were then serially clamped with ligasure cauterized and transected  bilaterally. Excellent hemostasis was visualized. Both cornu were clamped with  ligasure , cauterized and transected.  the uterus was delivered. All pedicles were inspected and appeared hemostatic.  The vaginal cuff angles were closed with an angle suture of 0 vicryl and transfixed to the ipsilateral uterosacral ligaments. The remainder of the vaginal cuff was closed with 0 vicryl in a running locked fashion. All instruments were removed from the vagina. Dr. Nicki Reaper McDermaid performed a urethral sling .. ( please see separate operative note).. I was not present for his portion of the surgery. .. the patient was taken to the recovery room awake and in stable condition.   Sponge lap and needle counts were correct times 2.

## 2017-06-01 NOTE — Transfer of Care (Signed)
Immediate Anesthesia Transfer of Care Note  Patient: Sally Brown  Procedure(s) Performed: HYSTERECTOMY VAGINAL (N/A ) BILATERAL SALPINGECTOMY (Bilateral ) CYSTOSCOPY (N/A ) PUBO-VAGINAL SLING (N/A )  Patient Location: PACU  Anesthesia Type:General  Level of Consciousness: sedated  Airway & Oxygen Therapy: Patient Spontanous Breathing and Patient connected to nasal cannula oxygen  Post-op Assessment: Report given to RN and Post -op Vital signs reviewed and stable  Post vital signs: Reviewed and stable  Last Vitals:  Vitals Value Taken Time  BP    Temp    Pulse 82 06/01/2017 12:55 PM  Resp    SpO2 100 % 06/01/2017 12:55 PM  Vitals shown include unvalidated device data.  Last Pain:  Vitals:   06/01/17 0748  TempSrc: Oral  PainSc: 0-No pain      Patients Stated Pain Goal: 4 (72/90/21 1155)  Complications: No apparent anesthesia complications

## 2017-06-01 NOTE — Anesthesia Procedure Notes (Signed)
Procedure Name: Intubation Date/Time: 06/01/2017 10:03 AM Performed by: Asher Muir, CRNA Pre-anesthesia Checklist: Patient identified, Emergency Drugs available, Suction available and Patient being monitored Patient Re-evaluated:Patient Re-evaluated prior to induction Oxygen Delivery Method: Circle system utilized and Simple face mask Preoxygenation: Pre-oxygenation with 100% oxygen Induction Type: IV induction Ventilation: Mask ventilation without difficulty Laryngoscope Size: Mac and 3 Grade View: Grade II Tube type: Oral Tube size: 7.0 mm Number of attempts: 1 Airway Equipment and Method: Stylet Placement Confirmation: ETT inserted through vocal cords under direct vision,  positive ETCO2 and breath sounds checked- equal and bilateral Secured at: 20 (right lip) cm Tube secured with: Tape Dental Injury: Teeth and Oropharynx as per pre-operative assessment

## 2017-06-01 NOTE — Anesthesia Preprocedure Evaluation (Signed)
Anesthesia Evaluation  Patient identified by MRN, date of birth, ID band Patient awake    Reviewed: Allergy & Precautions, NPO status , Patient's Chart, lab work & pertinent test results  History of Anesthesia Complications (+) PONV  Airway Mallampati: II  TM Distance: >3 FB Neck ROM: Full    Dental  (+) Dental Advisory Given   Pulmonary neg pulmonary ROS,    breath sounds clear to auscultation       Cardiovascular negative cardio ROS   Rhythm:Regular Rate:Normal     Neuro/Psych negative neurological ROS     GI/Hepatic negative GI ROS, Neg liver ROS,   Endo/Other  negative endocrine ROS  Renal/GU negative Renal ROS     Musculoskeletal   Abdominal   Peds  Hematology negative hematology ROS (+)   Anesthesia Other Findings   Reproductive/Obstetrics                             Lab Results  Component Value Date   WBC 8.1 05/20/2017   HGB 13.7 05/20/2017   HCT 41.2 05/20/2017   MCV 88.2 05/20/2017   PLT 316 05/20/2017   Lab Results  Component Value Date   CREATININE 0.69 11/24/2012   BUN 17 11/24/2012   NA 137 11/24/2012   K 4.0 11/24/2012   CL 103 11/24/2012   CO2 24 11/24/2012    Anesthesia Physical Anesthesia Plan  ASA: I  Anesthesia Plan: General   Post-op Pain Management:    Induction: Intravenous  PONV Risk Score and Plan: 4 or greater and Scopolamine patch - Pre-op, Midazolam, Dexamethasone, Ondansetron and Treatment may vary due to age or medical condition  Airway Management Planned: Oral ETT  Additional Equipment:   Intra-op Plan:   Post-operative Plan: Extubation in OR  Informed Consent: I have reviewed the patients History and Physical, chart, labs and discussed the procedure including the risks, benefits and alternatives for the proposed anesthesia with the patient or authorized representative who has indicated his/her understanding and acceptance.    Dental advisory given  Plan Discussed with: CRNA  Anesthesia Plan Comments:         Anesthesia Quick Evaluation

## 2017-06-01 NOTE — H&P (Signed)
Date of Initial H&P: 05/31/2017 History reviewed, patient examined, no change in status, stable for surgery.

## 2017-06-01 NOTE — Anesthesia Postprocedure Evaluation (Signed)
Anesthesia Post Note  Patient: Sally Brown  Procedure(s) Performed: HYSTERECTOMY VAGINAL (N/A ) BILATERAL SALPINGECTOMY (Bilateral ) CYSTOSCOPY (N/A ) PUBO-VAGINAL SLING (N/A )     Patient location during evaluation: PACU Anesthesia Type: General Level of consciousness: awake and alert Pain management: pain level controlled Vital Signs Assessment: post-procedure vital signs reviewed and stable Respiratory status: spontaneous breathing, nonlabored ventilation, respiratory function stable and patient connected to nasal cannula oxygen Cardiovascular status: blood pressure returned to baseline and stable Postop Assessment: no apparent nausea or vomiting Anesthetic complications: no    Last Vitals:  Vitals:   06/01/17 1415 06/01/17 1512  BP: (!) 96/51 (!) 98/58  Pulse: 60 (!) 55  Resp: 15 16  Temp: 36.4 C 36.6 C  SpO2: 96%     Last Pain:  Vitals:   06/01/17 1516  TempSrc:   PainSc: 3    Pain Goal: Patients Stated Pain Goal: 4 (06/01/17 1415)               Tiajuana Amass

## 2017-06-01 NOTE — Op Note (Signed)
Preoperative diagnosis: Stress urinary incontinence Postoperative diagnosis: Stress urinary incontinence Surgery: Sling cystourethropexy and cystoscopy Surgeon: Dr. Nicki Reaper Deniel Mcquiston  The patient has the above diagnosis and consented the above procedure.  Extra care was taken with leg positioning to minimize the risk of compartment syndrome and neuropathy and deep vein thrombosis.  Leg position was done by gynecology.  When I examined the patient she had a capacious vagina with very good access.  Usual retractors were utilized.  I made two less than 1 cm incisions 1 fingerbreadth above the symphysis pubis 1.5 cm lateral to the midline.  I carefully marked a 2 cm suburethral incision in the area the mid urethra.  She had a very long urethra with a lot of elasticity and stretchability of the vaginal wall mucosa.  3 cc of the lidocaine epinephrine mixture was utilized.  I made the incision appropriate depth.  I was careful of its position.  I sharply and with spreading dissected at the level of the urethrovesical angle bilaterally  With the bladder emptied I passed the trocar on top of and along the back of the symphysis pubis with my box technique.  Trocar was delivered onto the pulp of my index finger bilaterally.  I cystoscoped the patient.  Bladder mucosa was normal.  There was no bladder injury.  There was no movement of the bladder with movement of the trocar.  There was good efflux of yellow dye bilaterally.  Cystoscopically she had a mild cystocele.  Urethra was not injured and normal  With the bladder emptied I attached the sling with the described technique and brought up through the retropubic space.  She had bleeding from the right endopelvic fascia.  I tensioned the sling over the fat part of a moderate size Kelly clamp.  She had appropriate hypermobility.  There was no spring back effect.  I was very pleased with the tension and position of the mesh sling.  One could argue that the mid  urethral sling overlapped minimally with the proximal urethra's distal aspect.  It did not encroach upon the bladder neck  I closed the anterior vaginal wall with running 2-0 Vicryl on a CT1 needle followed by 2 interrupted sutures.  A generous vaginal pack was applied for good hemostasis.  I cut the mesh sling below the level of the suprapubic incisions.  I closed each incision with 1 interrupted 4-0 Vicryl and Dermabond.  Catheter was draining clear urine at the end of the case.  Hopefully the procedure will reach her treatment goal.  Blood loss was less than 50 mL

## 2017-06-02 ENCOUNTER — Encounter (HOSPITAL_COMMUNITY): Payer: Self-pay | Admitting: Obstetrics and Gynecology

## 2017-06-02 DIAGNOSIS — D251 Intramural leiomyoma of uterus: Secondary | ICD-10-CM | POA: Diagnosis not present

## 2017-06-02 MED ORDER — CIPROFLOXACIN HCL 250 MG PO TABS: 250.0000 mg | ORAL_TABLET | Freq: Two times a day (BID) | ORAL | 0 refills | Status: DC

## 2017-06-02 MED ORDER — TRAMADOL HCL 50 MG PO TABS
50.0000 mg | ORAL_TABLET | Freq: Four times a day (QID) | ORAL | 0 refills | Status: AC | PRN
Start: 2017-06-02 — End: 2017-06-09

## 2017-06-02 MED ORDER — IBUPROFEN 800 MG PO TABS: 800.0000 mg | ORAL_TABLET | Freq: Three times a day (TID) | ORAL | 1 refills | Status: DC | PRN

## 2017-06-02 NOTE — Progress Notes (Signed)
Vitals good Stable  Surgery detailed Post op instructions detailed Trial of void F/up clinic/phone

## 2017-06-02 NOTE — Progress Notes (Signed)
1 Day Post-Op Procedure(s) (LRB): HYSTERECTOMY VAGINAL (N/A) BILATERAL SALPINGECTOMY (Bilateral) CYSTOSCOPY (N/A) PUBO-VAGINAL SLING (N/A)  Subjective: Patient reports tolerating PO.  Pt has voided twice 100 cc out each time with residual 200-300 cc. Pain is well controlled. She has not required pain medication. + Flatus   Objective: I have reviewed patient's vital signs, intake and output and labs.  General: alert, cooperative and no distress   Lungs good effort Abdomen soft nontender + BS no rebound no guarding.  Ext no edema   Assessment: s/p Procedure(s): HYSTERECTOMY VAGINAL (N/A) BILATERAL SALPINGECTOMY (Bilateral) CYSTOSCOPY (N/A) PUBO-VAGINAL SLING (N/A): stable and progressing well  Plan: Encourage ambulation Discharge home pt to perform In and out cath at home and report results to Dr. Mechele Collin  LOS: 0 days    Donnie Panik J. 06/02/2017, 10:32 AM

## 2017-06-02 NOTE — Anesthesia Postprocedure Evaluation (Signed)
Anesthesia Post Note  Patient: Sally Brown  Procedure(s) Performed: HYSTERECTOMY VAGINAL (N/A ) BILATERAL SALPINGECTOMY (Bilateral ) CYSTOSCOPY (N/A ) PUBO-VAGINAL SLING (N/A )     Patient location during evaluation: [facility] Anesthesia Type: General Level of consciousness: awake and alert Pain management: pain level controlled Vital Signs Assessment: post-procedure vital signs reviewed and stable Respiratory status: spontaneous breathing Cardiovascular status: blood pressure returned to baseline Postop Assessment: no headache, patient able to bend at knees, no backache, no apparent nausea or vomiting, adequate PO intake and able to ambulate Anesthetic complications: no    Last Vitals:  Vitals:   06/01/17 2335 06/02/17 0320  BP: (!) 85/41 (!) 94/42  Pulse: 64 61  Resp: 16 14  Temp: 36.8 C 36.8 C  SpO2: 94% 94%    Last Pain:  Vitals:   06/02/17 0741  TempSrc:   PainSc: 2    Pain Goal: Patients Stated Pain Goal: 3 (06/02/17 0741)               Ailene Ards

## 2017-06-02 NOTE — Progress Notes (Signed)
Patient ID: Sally Brown, female   DOB: 01-29-1965, 52 y.o.   MRN: [id number] Pt discharged to home  teaching complete   Ambulated out

## 2017-06-02 NOTE — Discharge Summary (Signed)
Physician Discharge Summary  Patient ID: Sally Brown MRN: [id number] DOB/AGE: 51-Nov-1967 51 y.o.  Admit date: 06/01/2017 Discharge date: 06/02/2017  Admission Diagnoses:1) abnormal uterine bleeding. 2) fibroids 3) urinary incontinence   Discharge Diagnoses:  Active Problems:   S/P vaginal hysterectomy s/p uretrhal sling   Discharged Condition: good  Hospital Course: pt was admitted for observation after vaginal hysterectomy with bilateral salpingectomy and urethral sling. She did well postoperatively with return of bowel and bladder function. Post void residuals elevated pt to perform in and out caths at home and report to Dr. Mechele Collin.   Consults: None  Significant Diagnostic Studies: labs: hgb 11.8 post op day 1   Treatments: surgery: see above     Disposition: Discharge disposition: 01-Home or Self Care       Discharge Instructions    Call MD for:  persistant nausea and vomiting   Complete by:  As directed    Call MD for:  redness, tenderness, or signs of infection (pain, swelling, redness, odor or green/yellow discharge around incision site)   Complete by:  As directed    Call MD for:  severe uncontrolled pain   Complete by:  As directed    Call MD for:  temperature >100.4   Complete by:  As directed    Diet - low sodium heart healthy   Complete by:  As directed    Driving Restrictions   Complete by:  As directed    Avoid driving for 1 week   Increase activity slowly   Complete by:  As directed    Lifting restrictions   Complete by:  As directed    Avoid lifting over 10 lbs   Sexual Activity Restrictions   Complete by:  As directed    Avoid sex until approved by Dr. Landry Mellow     Allergies as of 06/02/2017      Reactions   Codeine Hives   Sulfa Antibiotics Hives      Medication List    TAKE these medications   ciprofloxacin 250 MG tablet Commonly known as:  CIPRO Take 1 tablet (250 mg total) by mouth 2 (two) times daily.   ibuprofen 800 MG  tablet Commonly known as:  ADVIL,MOTRIN Take 1 tablet (800 mg total) by mouth every 8 (eight) hours as needed (mild pain).   traMADol 50 MG tablet Commonly known as:  ULTRAM Take 1 tablet (50 mg total) by mouth every 6 (six) hours as needed for up to 7 days for moderate pain.      Follow-up Information    Christophe Louis, MD. Go in 2 week(s).   Specialty:  Obstetrics and Gynecology Why:  patient already has a postoperative appointment  Contact information: [address] [place] [address] [phone]           Signed: Catha Brown. 06/02/2017, 10:38 AM

## 2017-07-02 ENCOUNTER — Ambulatory Visit
Admission: RE | Admit: 2017-07-02 | Discharge: 2017-07-02 | Disposition: A | Discharge status: Home / Self Care | Payer: PRIVATE HEALTH INSURANCE | Source: Ambulatory Visit | Attending: Gastroenterology | Admitting: Gastroenterology

## 2017-07-02 DIAGNOSIS — Z803 Family history of malignant neoplasm of breast: Secondary | ICD-10-CM

## 2017-07-02 MED ORDER — GADOBENATE DIMEGLUMINE 529 MG/ML IV SOLN
15.0000 mL | Freq: Once | INTRAVENOUS | Status: AC | PRN
  Administered 2017-07-02: 15 mL via INTRAVENOUS

## 2018-03-14 ENCOUNTER — Other Ambulatory Visit: Payer: Self-pay | Admitting: Gastroenterology

## 2018-03-14 DIAGNOSIS — Z1231 Encounter for screening mammogram for malignant neoplasm of breast: Secondary | ICD-10-CM

## 2018-04-07 ENCOUNTER — Ambulatory Visit: Payer: PRIVATE HEALTH INSURANCE

## 2018-05-12 DIAGNOSIS — C801 Malignant (primary) neoplasm, unspecified: Secondary | ICD-10-CM

## 2018-05-12 HISTORY — DX: Malignant (primary) neoplasm, unspecified: C80.1

## 2018-05-22 ENCOUNTER — Ambulatory Visit
Admission: RE | Admit: 2018-05-22 | Discharge: 2018-05-22 | Disposition: A | Discharge status: Home / Self Care | Payer: PRIVATE HEALTH INSURANCE | Source: Ambulatory Visit | Attending: Gastroenterology | Admitting: Gastroenterology

## 2018-05-22 ENCOUNTER — Other Ambulatory Visit: Payer: Self-pay | Admitting: Gastroenterology

## 2018-05-22 ENCOUNTER — Other Ambulatory Visit: Payer: Self-pay

## 2018-05-22 DIAGNOSIS — N6489 Other specified disorders of breast: Secondary | ICD-10-CM

## 2018-05-22 DIAGNOSIS — Z1231 Encounter for screening mammogram for malignant neoplasm of breast: Secondary | ICD-10-CM

## 2018-05-23 ENCOUNTER — Ambulatory Visit
Admission: RE | Admit: 2018-05-23 | Discharge: 2018-05-23 | Disposition: A | Discharge status: Home / Self Care | Payer: PRIVATE HEALTH INSURANCE | Source: Ambulatory Visit | Attending: Gastroenterology | Admitting: Gastroenterology

## 2018-05-23 ENCOUNTER — Other Ambulatory Visit: Payer: Self-pay | Admitting: Gastroenterology

## 2018-05-23 DIAGNOSIS — N6489 Other specified disorders of breast: Secondary | ICD-10-CM

## 2018-05-29 ENCOUNTER — Other Ambulatory Visit: Payer: Self-pay

## 2018-05-29 ENCOUNTER — Ambulatory Visit
Admission: RE | Admit: 2018-05-29 | Discharge: 2018-05-29 | Disposition: A | Discharge status: Home / Self Care | Payer: PRIVATE HEALTH INSURANCE | Source: Ambulatory Visit | Attending: Gastroenterology | Admitting: Gastroenterology

## 2018-05-29 DIAGNOSIS — N6489 Other specified disorders of breast: Secondary | ICD-10-CM

## 2018-05-30 ENCOUNTER — Other Ambulatory Visit: Payer: Self-pay | Admitting: Gastroenterology

## 2018-05-30 DIAGNOSIS — C50411 Malignant neoplasm of upper-outer quadrant of right female breast: Secondary | ICD-10-CM

## 2018-05-31 ENCOUNTER — Ambulatory Visit
Admission: RE | Admit: 2018-05-31 | Discharge: 2018-05-31 | Disposition: A | Discharge status: Home / Self Care | Payer: No Typology Code available for payment source | Source: Ambulatory Visit | Attending: Gastroenterology | Admitting: Gastroenterology

## 2018-05-31 ENCOUNTER — Other Ambulatory Visit: Payer: Self-pay

## 2018-05-31 DIAGNOSIS — C50411 Malignant neoplasm of upper-outer quadrant of right female breast: Secondary | ICD-10-CM

## 2018-05-31 MED ORDER — GADOBUTROL 1 MMOL/ML IV SOLN
8.0000 mL | Freq: Once | INTRAVENOUS | Status: AC | PRN
  Administered 2018-05-31: 8 mL via INTRAVENOUS

## 2018-06-05 NOTE — Progress Notes (Signed)
[facility] CONSULT NOTE  Patient Care Team: Mayra Neer, MD as PCP - General (Family Medicine)  CHIEF COMPLAINTS/PURPOSE OF CONSULTATION: Newly diagnosed breast cancer  HISTORY OF PRESENTING ILLNESS:  Sally Brown 53 y.o. 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 The cancer was detected on a routine screening mammogram on 05/22/18 and was not palpable prior to diagnosis. Diagnostic mammogram and [place] on 05/23/18 showed asymmetry in the upper-outer right breast measuring 2cm in addition to known fibroadenomas and a benign lymph node. Breast biopsy on 05/23/18 confirmed the cancer to be lobular carcinoma in situ. Breast MRI on 05/31/18 showed the biopsy proven malignancy measuring 2cm, non mass enhancement in the lower-outer quadrant spanning 5.3cm, and indeterminate bright lesions throughout the liver.  The patient presents to the clinic today for initial evaluation and discussion of treatment options. She has a history of hysterectomy and bilateral salpingectomy in 2019.   I reviewed her records extensively and collaborated the history with the patient.  SUMMARY OF ONCOLOGIC HISTORY:   Lobular carcinoma in situ (LCIS) of right breast   05/29/2018 Initial Diagnosis    Screening mammogram detected right breast asymmetry, biopsy right breast UOQ: LCIS, breast MRI: Area of LCIS 2 cm.  New indeterminate non-mass enhancement lower outer quadrant right breast 5.3 cm, T2 lesions throughout the liver      MEDICAL HISTORY:  Past Medical History:  Diagnosis Date  . PONV (postoperative nausea and vomiting)   . SVD (spontaneous vaginal delivery)    x 3    SURGICAL HISTORY: Past Surgical History:  Procedure Laterality Date  . BILATERAL SALPINGECTOMY Bilateral 06/01/2017   Procedure: BILATERAL SALPINGECTOMY;  Surgeon: Christophe Louis, MD;  Location: [facility];  Service: Gynecology;  Laterality: Bilateral;  . BREAST BIOPSY Left 2009  .  CHOLECYSTECTOMY  2002  . COLONOSCOPY     polyp  . CYSTOSCOPY N/A 06/01/2017   Procedure: CYSTOSCOPY;  Surgeon: Bjorn Loser, MD;  Location: [facility];  Service: Urology;  Laterality: N/A;  . DILATION AND CURETTAGE OF UTERUS  2005  . left arm surgery     nerve relaese  . NovaSure Ablation  2006  . PUBOVAGINAL SLING N/A 06/01/2017   Procedure: Gaynelle Arabian;  Surgeon: Bjorn Loser, MD;  Location: [facility];  Service: Urology;  Laterality: N/A;  . TUBAL LIGATION    . VAGINAL HYSTERECTOMY N/A 06/01/2017   Procedure: HYSTERECTOMY VAGINAL;  Surgeon: Christophe Louis, MD;  Location: [facility];  Service: Gynecology;  Laterality: N/A;    SOCIAL HISTORY: Social History   Socioeconomic History  . Marital status: Single    Spouse name: Not on file  . Number of children: Not on file  . Years of education: Not on file  . Highest education level: Not on file  Occupational History  . Not on file  Social Needs  . Financial resource strain: Not on file  . Food insecurity:    Worry: Not on file    Inability: Not on file  . Transportation needs:    Medical: Not on file    Non-medical: Not on file  Tobacco Use  . Smoking status: Never Smoker  . Smokeless tobacco: Never Used  Substance and Sexual Activity  . Alcohol use: Yes    Alcohol/week: 2.0 - 3.0 standard drinks    Types: 2 - 3 Glasses of wine per week  . Drug use: Never  . Sexual activity: Yes    Birth control/protection:  Surgical  Lifestyle  . Physical activity:    Days per week: Not on file    Minutes per session: Not on file  . Stress: Not on file  Relationships  . Social connections:    Talks on phone: Not on file    Gets together: Not on file    Attends religious service: Not on file    Active member of club or organization: Not on file    Attends meetings of clubs or organizations: Not on file    Relationship status: Not on file  . Intimate partner violence:    Fear of current or ex partner: Not on file    Emotionally  abused: Not on file    Physically abused: Not on file    Forced sexual activity: Not on file  Other Topics Concern  . Not on file  Social History Narrative  . Not on file    FAMILY HISTORY: Family History  Problem Relation Age of Onset  . Breast cancer Mother 50       again at age 3    ALLERGIES:  is allergic to codeine and sulfa antibiotics.  MEDICATIONS:  Current Outpatient Medications  Medication Sig Dispense Refill  . tamoxifen (NOLVADEX) 20 MG tablet Take 1 tablet (20 mg total) by mouth daily. 90 tablet 3   No current facility-administered medications for this visit.     REVIEW OF SYSTEMS:   Constitutional: Denies fevers, chills or abnormal night sweats Eyes: Denies blurriness of vision, double vision or watery eyes Ears, nose, mouth, throat, and face: Denies mucositis or sore throat Respiratory: Denies cough, dyspnea or wheezes Cardiovascular: Denies palpitation, chest discomfort or lower extremity swelling Gastrointestinal:  Denies nausea, heartburn or change in bowel habits Skin: Denies abnormal skin rashes Lymphatics: Denies new lymphadenopathy or easy bruising Neurological:Denies numbness, tingling or new weaknesses Behavioral/Psych: Mood is stable, no new changes  Breast: Denies any palpable lumps or discharge All other systems were reviewed with the patient and are negative.  PHYSICAL EXAMINATION: ECOG PERFORMANCE STATUS: 0 - Asymptomatic Exam was not performed because of social dispensing guidelines  LABORATORY DATA:  I have reviewed the data as listed Lab Results  Component Value Date   WBC 16.7 (H) 06/01/2017   HGB 11.8 (L) 06/01/2017   HCT 35.1 (L) 06/01/2017   MCV 86.9 06/01/2017   PLT 250 06/01/2017   Lab Results  Component Value Date   NA 137 11/24/2012   K 4.0 11/24/2012   CL 103 11/24/2012   CO2 24 11/24/2012    RADIOGRAPHIC STUDIES: I have personally reviewed the radiological reports and agreed with the findings in the report.   ASSESSMENT AND PLAN:  Lobular carcinoma in situ (LCIS) of right breast 05/29/2018:Screening mammogram detected right breast asymmetry, biopsy right breast UOQ: LCIS, breast MRI: Area of LCIS 2 cm.  New indeterminate non-mass enhancement lower outer quadrant right breast 5.3 cm, T2 lesions throughout the liver   Pathology counseling: I discussed with her the difference between LCIS and invasive cancer. LCIS is a risk factor for breast cancer and not a premalignant condition. She is at higher than normal risk of breast cancer. Risk of invasive and noninvasive breast cancer with LCIS is 1% per year. Her cumulative risk lifetime would be around 30%. Tamoxifen would reduce this risk by half. This is based on NSABP P-1 clinical trial  Risk reduction strategies: 1. Tamoxifen or raloxifene are indicated to decrease the risk of another noninvasive or invasive breast cancer. Patient fully  understands that neither of these medications would prolong her life but certainly help decrease recurrence rate by 50% . 2. Recommended annual mammograms and breast exams and breast MRI for surveillance.   Tamoxifen counseling:We discussed the risks and benefits of tamoxifen. These include but not limited to insomnia, hot flashes, mood changes, vaginal dryness, and weight gain. Although rare, serious side effects including endometrial cancer, risk of blood clots were also discussed. We strongly believe that the benefits far outweigh the risks. Patient understands these risks and consented to starting treatment. Planned treatment duration is 5 years.  Treatment plan: 1.  Biopsy of the additional non-mass enhancement 2.  MRI of the liver 3.  Surgical evaluation 4.  Adjuvant antiestrogen treatment 5.  Genetic counseling  I sent a prescription for tamoxifen to start at this time.  If she were to need surgery then she will hold tamoxifen 1 week prior to surgery.  Return to clinic (virtual visit) after surgery to discuss  pathology report.  All questions were answered. The patient knows to call the clinic with any problems, questions or concerns.   Sally Eisenmenger, MD 06/06/2018   I, Sally Brown, am acting as scribe for Sally Lose, MD.  I have reviewed the above documentation for accuracy and completeness, and I agree with the above.

## 2018-06-06 ENCOUNTER — Encounter: Payer: Self-pay | Admitting: *Deleted

## 2018-06-06 ENCOUNTER — Other Ambulatory Visit: Payer: Self-pay | Admitting: Genetic Counselor

## 2018-06-06 ENCOUNTER — Other Ambulatory Visit: Payer: Self-pay

## 2018-06-06 ENCOUNTER — Other Ambulatory Visit: Payer: Self-pay | Admitting: *Deleted

## 2018-06-06 ENCOUNTER — Inpatient Hospital Stay
Payer: No Typology Code available for payment source | Attending: Hematology and Oncology | Admitting: Hematology and Oncology

## 2018-06-06 ENCOUNTER — Other Ambulatory Visit: Payer: Self-pay | Admitting: Hematology and Oncology

## 2018-06-06 ENCOUNTER — Telehealth: Payer: Self-pay | Admitting: *Deleted

## 2018-06-06 DIAGNOSIS — N6321 Unspecified lump in the left breast, upper outer quadrant: Secondary | ICD-10-CM | POA: Diagnosis not present

## 2018-06-06 DIAGNOSIS — C787 Secondary malignant neoplasm of liver and intrahepatic bile duct: Secondary | ICD-10-CM

## 2018-06-06 DIAGNOSIS — D0501 Lobular carcinoma in situ of right breast: Secondary | ICD-10-CM

## 2018-06-06 MED ORDER — TAMOXIFEN CITRATE 20 MG PO TABS: 20.0000 mg | ORAL_TABLET | Freq: Every day | ORAL | 3 refills | Status: DC

## 2018-06-06 NOTE — Progress Notes (Signed)
MRI

## 2018-06-06 NOTE — Telephone Encounter (Signed)
Spoke with patient to inform her of genetics appointment for 06/07/18 at 11am via webex.  She will come in for lab at 8:15am tomorrow.  Discussed navigation resources and encouraged her to call with any questions or concerns.

## 2018-06-06 NOTE — Assessment & Plan Note (Signed)
05/29/2018:Screening mammogram detected right breast asymmetry, biopsy right breast UOQ: LCIS, breast MRI: Area of LCIS 2 cm.  New indeterminate non-mass enhancement lower outer quadrant right breast 5.3 cm, T2 lesions throughout the liver   Pathology counseling: I discussed with her the difference between LCIS and invasive cancer. LCIS is a risk factor for breast cancer and not a premalignant condition. She is at higher than normal risk of breast cancer. Risk of invasive and noninvasive breast cancer with LCIS is 1% per year. Her cumulative risk lifetime would be around 30%. Tamoxifen would reduce this risk by half. This is based on NSABP P-1 clinical trial  Risk reduction strategies: 1. Tamoxifen or raloxifene are indicated to decrease the risk of another noninvasive or invasive breast cancer. Patient fully understands that neither of these medications would prolong her life but certainly help decrease recurrence rate by 50% . 2. Recommended annual mammograms and breast exams and breast MRI for surveillance.   Tamoxifen toxicities:We discussed the risks and benefits of tamoxifen. These include but not limited to insomnia, hot flashes, mood changes, vaginal dryness, and weight gain. Although rare, serious side effects including endometrial cancer, risk of blood clots were also discussed. We strongly believe that the benefits far outweigh the risks. Patient understands these risks and consented to starting treatment. Planned treatment duration is 5 years.  Treatment plan: 1.  Biopsy of the additional non-mass enhancement 2.  MRI of the liver 3.  Surgical evaluation 4.  Adjuvant antiestrogen treatment  Return to clinic (virtual visit) after surgery to discuss pathology report.

## 2018-06-07 ENCOUNTER — Inpatient Hospital Stay: Payer: No Typology Code available for payment source

## 2018-06-07 ENCOUNTER — Ambulatory Visit
Admission: RE | Admit: 2018-06-07 | Discharge: 2018-06-07 | Disposition: A | Discharge status: Home / Self Care | Payer: No Typology Code available for payment source | Source: Ambulatory Visit | Attending: Hematology and Oncology | Admitting: Hematology and Oncology

## 2018-06-07 ENCOUNTER — Other Ambulatory Visit: Payer: Self-pay

## 2018-06-07 ENCOUNTER — Encounter: Payer: Self-pay | Admitting: Genetic Counselor

## 2018-06-07 ENCOUNTER — Inpatient Hospital Stay (HOSPITAL_BASED_OUTPATIENT_CLINIC_OR_DEPARTMENT_OTHER): Payer: No Typology Code available for payment source | Admitting: Genetic Counselor

## 2018-06-07 DIAGNOSIS — D0501 Lobular carcinoma in situ of right breast: Secondary | ICD-10-CM

## 2018-06-07 DIAGNOSIS — C787 Secondary malignant neoplasm of liver and intrahepatic bile duct: Secondary | ICD-10-CM

## 2018-06-07 DIAGNOSIS — Z803 Family history of malignant neoplasm of breast: Secondary | ICD-10-CM | POA: Diagnosis not present

## 2018-06-07 DIAGNOSIS — Z7183 Encounter for nonprocreative genetic counseling: Secondary | ICD-10-CM

## 2018-06-07 DIAGNOSIS — Z800 Family history of malignant neoplasm of digestive organs: Secondary | ICD-10-CM | POA: Diagnosis not present

## 2018-06-07 MED ORDER — GADOBENATE DIMEGLUMINE 529 MG/ML IV SOLN
14.0000 mL | Freq: Once | INTRAVENOUS | Status: AC | PRN
  Administered 2018-06-07: 15:00:00 14 mL via INTRAVENOUS

## 2018-06-07 MED ORDER — GADOBENATE DIMEGLUMINE 529 MG/ML IV SOLN: 15.0000 mL | Freq: Once | INTRAVENOUS | Status: DC | PRN

## 2018-06-07 NOTE — Progress Notes (Signed)
REFERRING PROVIDER: Nicholas Lose, MD [address] [place], [place] [address]  PRIMARY PROVIDER:  Mayra Neer, MD  PRIMARY REASON FOR VISIT:  1. Lobular carcinoma in situ (LCIS) of right breast   2. Family history of breast cancer   3. Family history of colon cancer      HISTORY OF PRESENT ILLNESS:  I connected with Sally Brown on 06/07/2018 at 11 AM EDT by Webex video conference and verified that I am speaking with the correct person using two identifiers.   Patient location: Home Provider location: Office  Sally Brown, a 53 y.o. female, was seen for a [place] cancer genetics consultation at the request of Dr. Lindi Adie due to a personal and family history of breast cancer.  Sally Brown presents to clinic today to discuss the possibility of a hereditary predisposition to cancer, genetic testing, and to further clarify her future cancer risks, as well as potential cancer risks for family members.   In May 2020, at the age of 39, Sally Brown was diagnosed with LCIS of the right breast. The treatment plan includes a liver MRI, another breast MRI and a discussion with a surgeon next week.    CANCER HISTORY:    Lobular carcinoma in situ (LCIS) of right breast   05/29/2018 Initial Diagnosis    Screening mammogram detected right breast asymmetry, biopsy right breast UOQ: LCIS, breast MRI: Area of LCIS 2 cm.  New indeterminate non-mass enhancement lower outer quadrant right breast 5.3 cm, T2 lesions throughout the liver       RISK FACTORS:  Menarche was at age 54.  First live birth at age 10.  Ovaries intact: yes.  Hysterectomy: no.  Menopausal status: perimenopausal.  HRT use: 0 years. Colonoscopy: yes; normal. Mammogram within the last year: yes. Number of breast biopsies: 1. Up to date with pelvic exams: yes. Any excessive radiation exposure in the past: no  Past Medical History:  Diagnosis Date  . Family history of breast cancer   . PONV (postoperative  nausea and vomiting)   . SVD (spontaneous vaginal delivery)    x 3    Past Surgical History:  Procedure Laterality Date  . BILATERAL SALPINGECTOMY Bilateral 06/01/2017   Procedure: BILATERAL SALPINGECTOMY;  Surgeon: Christophe Louis, MD;  Location: [facility];  Service: Gynecology;  Laterality: Bilateral;  . BREAST BIOPSY Left 2009  . CHOLECYSTECTOMY  2002  . COLONOSCOPY     polyp  . CYSTOSCOPY N/A 06/01/2017   Procedure: CYSTOSCOPY;  Surgeon: Bjorn Loser, MD;  Location: [facility];  Service: Urology;  Laterality: N/A;  . DILATION AND CURETTAGE OF UTERUS  2005  . left arm surgery     nerve relaese  . NovaSure Ablation  2006  . PUBOVAGINAL SLING N/A 06/01/2017   Procedure: Gaynelle Arabian;  Surgeon: Bjorn Loser, MD;  Location: [facility];  Service: Urology;  Laterality: N/A;  . TUBAL LIGATION    . VAGINAL HYSTERECTOMY N/A 06/01/2017   Procedure: HYSTERECTOMY VAGINAL;  Surgeon: Christophe Louis, MD;  Location: [facility];  Service: Gynecology;  Laterality: N/A;    Social History   Socioeconomic History  . Marital status: Single    Spouse name: Not on file  . Number of children: Not on file  . Years of education: Not on file  . Highest education level: Not on file  Occupational History  . Not on file  Social Needs  . Financial resource strain: Not on file  . Food insecurity:    Worry: Not on  file    Inability: Not on file  . Transportation needs:    Medical: Not on file    Non-medical: Not on file  Tobacco Use  . Smoking status: Never Smoker  . Smokeless tobacco: Never Used  Substance and Sexual Activity  . Alcohol use: Yes    Alcohol/week: 2.0 - 3.0 standard drinks    Types: 2 - 3 Glasses of wine per week  . Drug use: Never  . Sexual activity: Yes    Birth control/protection: Surgical  Lifestyle  . Physical activity:    Days per week: Not on file    Minutes per session: Not on file  . Stress: Not on file  Relationships  . Social connections:    Talks on phone: Not on file     Gets together: Not on file    Attends religious service: Not on file    Active member of club or organization: Not on file    Attends meetings of clubs or organizations: Not on file    Relationship status: Not on file  Other Topics Concern  . Not on file  Social History Narrative  . Not on file     FAMILY HISTORY:  We obtained a detailed, 4-generation family history.  Significant diagnoses are listed below: Family History  Problem Relation Age of Onset  . Breast cancer Mother 15       again at age 62  . Other Maternal Grandmother        died in childbirth  . Colon cancer Paternal Grandmother   . Breast cancer Other        3 of MGMs sisters   The patient has two daughters and a son who are cancer free.  She has one sister who is cancer free.  Her mother is living and her father is deceased.  The patient's mother had breast cancer at 57 and again at 25.  She has one sister who is cancer free and three paternal half brothers who are cancer free.  The maternal grandmother died in her 63's-30's due to childbirth.  Reportedly several of her sisters and their daughters have breast cancer.  The patient's father was an only child.  His parents died from non cancer related issues.  Sally Brown is unaware of previous family history of genetic testing for hereditary cancer risks. Patient's maternal ancestors are of Caucasian descent, and paternal ancestors are of Caucasian descent. There is no reported Ashkenazi Jewish ancestry. There is no known consanguinity.  GENETIC COUNSELING ASSESSMENT: Sally Brown is a 53 y.o. female with a personal and family history of breast cancer which is somewhat suggestive of a hereditary cancer syndrome and predisposition to cancer. We, therefore, discussed and recommended the following at today's visit.   DISCUSSION: We discussed that 5 - 10% of breast cancer is hereditary, with most cases associated with BRCA mutations.  There are other genes that can be  associated with hereditary breast cancer syndromes.  These include ATM, CHEK2 and PALB2.  We discussed that testing is beneficial for several reasons including knowing how to follow individuals after completing their treatment, identifying whether potential treatment options such as PARP inhibitors would be beneficial, and understand if other family members could be at risk for cancer and allow them to undergo genetic testing.   We reviewed the characteristics, features and inheritance patterns of hereditary cancer syndromes. We also discussed genetic testing, including the appropriate family members to test, the process of testing, insurance coverage and turn-around-time  for results. We discussed the implications of a negative, positive and/or variant of uncertain significant result. We recommended Sally Brown pursue genetic testing for the common hereditary gene panel. The Common Hereditary Gene Panel offered by [facility] includes sequencing and/or deletion duplication testing of the following 48 genes: APC, ATM, AXIN2, BARD1, BMPR1A, BRCA1, BRCA2, BRIP1, CDH1, CDK4, CDKN2A (p14ARF), CDKN2A (p16INK4a), CHEK2, CTNNA1, DICER1, EPCAM (Deletion/duplication testing only), GREM1 (promoter region deletion/duplication testing only), KIT, MEN1, MLH1, MSH2, MSH3, MSH6, MUTYH, NBN, NF1, NHTL1, PALB2, PDGFRA, PMS2, POLD1, POLE, PTEN, RAD50, RAD51C, RAD51D, RNF43, SDHB, SDHC, SDHD, SMAD4, SMARCA4. STK11, TP53, TSC1, TSC2, and VHL.  The following genes were evaluated for sequence changes only: SDHA and HOXB13 c.251G>A variant only.   Based on Sally Brown's personal and family history of cancer, she meets medical criteria for genetic testing. Despite that she meets criteria, she may still have an out of pocket cost. We discussed that if her out of pocket cost for testing is over $100, the laboratory will call and confirm whether she wants to proceed with testing.  If the out of pocket cost of testing is less than $100 she will  be billed by the genetic testing laboratory.   PLAN: After considering the risks, benefits, and limitations, Sally Brown provided informed consent to pursue genetic testing and the blood sample was sent to [facility] for analysis of the common hereditary cancer panel. Results should be available within approximately 2-3 weeks' time, at which point they will be disclosed by telephone to Sally Brown, as will any additional recommendations warranted by these results. Sally Brown will receive a summary of her genetic counseling visit and a copy of her results once available. This information will also be available in Epic.   Lastly, we encouraged Ms. Skeens to remain in contact with cancer genetics annually so that we can continuously update the family history and inform her of any changes in cancer genetics and testing that may be of benefit for this family.   Sally Brown questions were answered to her satisfaction today. Our contact information was provided should additional questions or concerns arise. Thank you for the referral and allowing [place] to share in the care of your patient.   Kamauri Denardo P. Florene Glen, [place], [facility] Certified Genetic Counselor [email or web address] phone: [phone]  The patient was seen for a total of 45 minutes in face-to-face genetic counseling.  This patient was discussed with Drs. Magrinat, Lindi Adie and/or Burr Medico who agrees with the above.    _______________________________________________________________________ For Office Staff:  Number of people involved in session: 1 Was an Intern/ student involved with case: no

## 2018-06-08 ENCOUNTER — Other Ambulatory Visit: Payer: Self-pay | Admitting: Hematology and Oncology

## 2018-06-08 ENCOUNTER — Telehealth: Payer: Self-pay | Admitting: *Deleted

## 2018-06-08 DIAGNOSIS — C787 Secondary malignant neoplasm of liver and intrahepatic bile duct: Secondary | ICD-10-CM

## 2018-06-08 NOTE — Progress Notes (Signed)
Patient has findings on the liver MRI suggestive of metastatic disease. This is highly unusual since her primary diagnosis is LCIS. It would indicate that we are missing something whether it is an another primary tumor in the breast or whether there is a second cancer elsewhere.  I discussed with interventional radiology and the plan is to obtain a PET CT scan. Based on that we will obtain a biopsy of the liver or another location if that was abnormal

## 2018-06-08 NOTE — Telephone Encounter (Signed)
Spoke with patient concerning scheduling of PET scan.  Confirmed appointment for 06/14/18 at [facility] at 3:30pm.  Instructions given.

## 2018-06-12 ENCOUNTER — Ambulatory Visit
Admission: RE | Admit: 2018-06-12 | Discharge: 2018-06-12 | Disposition: A | Discharge status: Home / Self Care | Payer: No Typology Code available for payment source | Source: Ambulatory Visit | Attending: Hematology and Oncology | Admitting: Hematology and Oncology

## 2018-06-12 ENCOUNTER — Other Ambulatory Visit: Payer: Self-pay

## 2018-06-12 DIAGNOSIS — C787 Secondary malignant neoplasm of liver and intrahepatic bile duct: Secondary | ICD-10-CM

## 2018-06-12 MED ORDER — GADOBUTROL 1 MMOL/ML IV SOLN
5.0000 mL | Freq: Once | INTRAVENOUS | Status: AC | PRN
  Administered 2018-06-12: 10:00:00 5 mL via INTRAVENOUS

## 2018-06-13 ENCOUNTER — Encounter: Payer: Self-pay | Admitting: Genetic Counselor

## 2018-06-13 ENCOUNTER — Telehealth: Payer: Self-pay | Admitting: Genetic Counselor

## 2018-06-13 ENCOUNTER — Ambulatory Visit: Payer: Self-pay | Admitting: Genetic Counselor

## 2018-06-13 DIAGNOSIS — Z1379 Encounter for other screening for genetic and chromosomal anomalies: Secondary | ICD-10-CM

## 2018-06-13 NOTE — Telephone Encounter (Signed)
Revealed negative genetic testing.  Discussed that we do not know why she has breast cancer or why there is cancer in the family. It could be due to a different gene that we are not testing, or maybe our current technology may not be able to pick something up.  It will be important for her to keep in contact with genetics to keep up with whether additional testing may be needed. 

## 2018-06-13 NOTE — Progress Notes (Signed)
HPI:  Ms. Folkes was previously seen in the [place] clinic due to a personal and family history of cancer and concerns regarding a hereditary predisposition to cancer. Please refer to our prior cancer genetics clinic note for more information regarding our discussion, assessment and recommendations, at the time. Ms. Garza recent genetic test results were disclosed to her, as were recommendations warranted by these results. These results and recommendations are discussed in more detail below.  CANCER HISTORY:    Lobular carcinoma in situ (LCIS) of right breast   05/29/2018 Initial Diagnosis    Screening mammogram detected right breast asymmetry, biopsy right breast UOQ: LCIS, breast MRI: Area of LCIS 2 cm.  New indeterminate non-mass enhancement lower outer quadrant right breast 5.3 cm, T2 lesions throughout the liver     06/13/2018 Genetic Testing    No Pathogenic variants identified. APC c.4709A>G VUS found.  The Common Hereditary Gene Panel offered by [facility] includes sequencing and/or deletion duplication testing of the following 48 genes: APC, ATM, AXIN2, BARD1, BMPR1A, BRCA1, BRCA2, BRIP1, CDH1, CDK4, CDKN2A (p14ARF), CDKN2A (p16INK4a), CHEK2, CTNNA1, DICER1, EPCAM (Deletion/duplication testing only), GREM1 (promoter region deletion/duplication testing only), KIT, MEN1, MLH1, MSH2, MSH3, MSH6, MUTYH, NBN, NF1, NHTL1, PALB2, PDGFRA, PMS2, POLD1, POLE, PTEN, RAD50, RAD51C, RAD51D, RNF43, SDHB, SDHC, SDHD, SMAD4, SMARCA4. STK11, TP53, TSC1, TSC2, and VHL.  The following genes were evaluated for sequence changes only: SDHA and HOXB13 c.251G>A variant only. The report date is June 13, 2018.     FAMILY HISTORY:  We obtained a detailed, 4-generation family history.  Significant diagnoses are listed below: Family History  Problem Relation Age of Onset  . Breast cancer Mother 52       again at age 12  . Other Maternal Grandmother        died in childbirth  . Colon cancer  Paternal Grandmother   . Breast cancer Other        3 of MGMs sisters    The patient has two daughters and a son who are cancer free.  She has one sister who is cancer free.  Her mother is living and her father is deceased.  The patient's mother had breast cancer at 41 and again at 73.  She has one sister who is cancer free and three paternal half brothers who are cancer free.  The maternal grandmother died in her 72's-30's due to childbirth.  Reportedly several of her sisters and their daughters have breast cancer.  The patient's father was an only child.  His parents died from non cancer related issues.  Ms. Stegall is unaware of previous family history of genetic testing for hereditary cancer risks. Patient's maternal ancestors are of Caucasian descent, and paternal ancestors are of Caucasian descent. There is no reported Ashkenazi Jewish ancestry. There is no known consanguinity.    GENETIC TEST RESULTS: Genetic testing reported out on May 13, 2018 through the common hereditary cancer panel found no pathogenic mutations. The Common Hereditary Gene Panel offered by [facility] includes sequencing and/or deletion duplication testing of the following 48 genes: APC, ATM, AXIN2, BARD1, BMPR1A, BRCA1, BRCA2, BRIP1, CDH1, CDK4, CDKN2A (p14ARF), CDKN2A (p16INK4a), CHEK2, CTNNA1, DICER1, EPCAM (Deletion/duplication testing only), GREM1 (promoter region deletion/duplication testing only), KIT, MEN1, MLH1, MSH2, MSH3, MSH6, MUTYH, NBN, NF1, NHTL1, PALB2, PDGFRA, PMS2, POLD1, POLE, PTEN, RAD50, RAD51C, RAD51D, RNF43, SDHB, SDHC, SDHD, SMAD4, SMARCA4. STK11, TP53, TSC1, TSC2, and VHL.  The following genes were evaluated for sequence changes only: SDHA and HOXB13 c.251G>A  variant only. The test report has been scanned into EPIC and is located under the Molecular Pathology section of the Results Review tab.  A portion of the result report is included below for reference.     We discussed with Ms. Cosma that  because current genetic testing is not perfect, it is possible there may be a gene mutation in one of these genes that current testing cannot detect, but that chance is small.  We also discussed, that there could be another gene that has not yet been discovered, or that we have not yet tested, that is responsible for the cancer diagnoses in the family. It is also possible there is a hereditary cause for the cancer in the family that Ms. Delk did not inherit and therefore was not identified in her testing.  Therefore, it is important to remain in touch with cancer genetics in the future so that we can continue to offer Ms. Barno the most up to date genetic testing.   Genetic testing did identify a variant of uncertain significance (VUS) was identified in the APC gene called c.4709A>G.  At this time, it is unknown if this variant is associated with increased cancer risk or if this is a normal finding, but most variants such as this get reclassified to being inconsequential. It should not be used to make medical management decisions. With time, we suspect the lab will determine the significance of this variant, if any. If we do learn more about it, we will try to contact Ms. Yorks to discuss it further. However, it is important to stay in touch with [place] periodically and keep the address and phone number up to date.  ADDITIONAL GENETIC TESTING: We discussed with Ms. Lanpher that there are other genes that are associated with increased cancer risk that can be analyzed. Should Ms. Dwyer wish to pursue additional genetic testing, we are happy to discuss and coordinate this testing, at any time.    CANCER SCREENING RECOMMENDATIONS: Ms. Salmela test result is considered negative (normal).  This means that we have not identified a hereditary cause for her personal and family history of cancer at this time. Most cancers happen by chance and this negative test suggests that her cancer may fall into this  category.    While reassuring, this does not definitively rule out a hereditary predisposition to cancer. It is still possible that there could be genetic mutations that are undetectable by current technology. There could be genetic mutations in genes that have not been tested or identified to increase cancer risk.  Therefore, it is recommended she continue to follow the cancer management and screening guidelines provided by her oncology and primary healthcare provider.   An individual's cancer risk and medical management are not determined by genetic test results alone. Overall cancer risk assessment incorporates additional factors, including personal medical history, family history, and any available genetic information that may result in a personalized plan for cancer prevention and surveillance  RECOMMENDATIONS FOR FAMILY MEMBERS:  Individuals in this family might be at some increased risk of developing cancer, over the general population risk, simply due to the family history of cancer.  We recommended women in this family have a yearly mammogram beginning at age 32, or 75 years younger than the earliest onset of cancer, an annual clinical breast exam, and perform monthly breast self-exams. Women in this family should also have a gynecological exam as recommended by their primary provider. All family members should have a colonoscopy by  age 37.  FOLLOW-UP: Lastly, we discussed with Ms. Kluttz that cancer genetics is a rapidly advancing field and it is possible that new genetic tests will be appropriate for her and/or her family members in the future. We encouraged her to remain in contact with cancer genetics on an annual basis so we can update her personal and family histories and let her know of advances in cancer genetics that may benefit this family.   Our contact number was provided. Ms. Tempesta questions were answered to her satisfaction, and she knows she is welcome to call us at anytime  with additional questions or concerns.   Roma Kayser, MS, [facility] Certified Genetic Counselor [email or web address]

## 2018-06-14 ENCOUNTER — Encounter (HOSPITAL_COMMUNITY)
Admission: RE | Admit: 2018-06-14 | Discharge: 2018-06-14 | Disposition: A | Discharge status: Home / Self Care | Payer: No Typology Code available for payment source | Source: Ambulatory Visit | Attending: Hematology and Oncology | Admitting: Hematology and Oncology

## 2018-06-14 ENCOUNTER — Other Ambulatory Visit: Payer: Self-pay

## 2018-06-14 DIAGNOSIS — C787 Secondary malignant neoplasm of liver and intrahepatic bile duct: Secondary | ICD-10-CM | POA: Diagnosis present

## 2018-06-14 LAB — GLUCOSE, CAPILLARY: Glucose-Capillary: 76 mg/dL (ref 70–99)

## 2018-06-14 MED ORDER — FLUDEOXYGLUCOSE F - 18 (FDG) INJECTION
8.4300 | Freq: Once | INTRAVENOUS | Status: AC
  Administered 2018-06-14: 8.43 via INTRAVENOUS

## 2018-06-15 ENCOUNTER — Other Ambulatory Visit: Payer: Self-pay | Admitting: Hematology and Oncology

## 2018-06-15 DIAGNOSIS — C787 Secondary malignant neoplasm of liver and intrahepatic bile duct: Secondary | ICD-10-CM

## 2018-06-15 NOTE — Progress Notes (Signed)
I discussed with Dr. Kathlene Cote who agreed to ultrasound-guided liver biopsy.

## 2018-06-19 ENCOUNTER — Encounter: Payer: Self-pay | Admitting: *Deleted

## 2018-06-19 NOTE — Progress Notes (Signed)
Received Fax from [facility] requesting information regarding pt treatment plan.  Paper filled out and faxed with attached recent office notes and imaging scans.

## 2018-06-20 ENCOUNTER — Other Ambulatory Visit: Payer: Self-pay | Admitting: Student

## 2018-06-21 ENCOUNTER — Ambulatory Visit (HOSPITAL_COMMUNITY)
Admission: RE | Admit: 2018-06-21 | Discharge: 2018-06-21 | Disposition: A | Discharge status: Home / Self Care | Payer: No Typology Code available for payment source | Source: Ambulatory Visit | Attending: Hematology and Oncology | Admitting: Hematology and Oncology

## 2018-06-21 ENCOUNTER — Other Ambulatory Visit: Payer: Self-pay

## 2018-06-21 DIAGNOSIS — Z79899 Other long term (current) drug therapy: Secondary | ICD-10-CM | POA: Diagnosis not present

## 2018-06-21 DIAGNOSIS — Z9049 Acquired absence of other specified parts of digestive tract: Secondary | ICD-10-CM | POA: Insufficient documentation

## 2018-06-21 DIAGNOSIS — C787 Secondary malignant neoplasm of liver and intrahepatic bile duct: Secondary | ICD-10-CM | POA: Diagnosis not present

## 2018-06-21 DIAGNOSIS — Z803 Family history of malignant neoplasm of breast: Secondary | ICD-10-CM | POA: Diagnosis not present

## 2018-06-21 DIAGNOSIS — Z800 Family history of malignant neoplasm of digestive organs: Secondary | ICD-10-CM | POA: Diagnosis not present

## 2018-06-21 DIAGNOSIS — Z90710 Acquired absence of both cervix and uterus: Secondary | ICD-10-CM | POA: Insufficient documentation

## 2018-06-21 DIAGNOSIS — K769 Liver disease, unspecified: Secondary | ICD-10-CM | POA: Insufficient documentation

## 2018-06-21 LAB — APTT: aPTT: 30 seconds (ref 24–36)

## 2018-06-21 LAB — CBC
HCT: 38.9 % (ref 36.0–46.0)
Hemoglobin: 13 g/dL (ref 12.0–15.0)
MCH: 28.6 pg (ref 26.0–34.0)
MCHC: 33.4 g/dL (ref 30.0–36.0)
MCV: 85.5 fL (ref 80.0–100.0)
Platelets: 317 10*3/uL (ref 150–400)
RBC: 4.55 MIL/uL (ref 3.87–5.11)
RDW: 13.3 % (ref 11.5–15.5)
WBC: 9 10*3/uL (ref 4.0–10.5)
nRBC: 0 % (ref 0.0–0.2)

## 2018-06-21 LAB — PROTIME-INR
INR: 1.1 (ref 0.8–1.2)
Prothrombin Time: 14.3 seconds (ref 11.4–15.2)

## 2018-06-21 MED ORDER — FENTANYL CITRATE (PF) 100 MCG/2ML IJ SOLN
INTRAMUSCULAR | Status: AC | PRN
  Administered 2018-06-21: 25 ug via INTRAVENOUS

## 2018-06-21 MED ORDER — MIDAZOLAM HCL 2 MG/2ML IJ SOLN
INTRAMUSCULAR | Status: AC
  Filled 2018-06-21: qty 2

## 2018-06-21 MED ORDER — PROMETHAZINE HCL 25 MG/ML IJ SOLN
12.5000 mg | Freq: Once | INTRAMUSCULAR | Status: AC
  Administered 2018-06-21: 12.5 mg via INTRAVENOUS
  Filled 2018-06-21: qty 1

## 2018-06-21 MED ORDER — FENTANYL CITRATE (PF) 100 MCG/2ML IJ SOLN
INTRAMUSCULAR | Status: AC
  Filled 2018-06-21: qty 2

## 2018-06-21 MED ORDER — LIDOCAINE HCL (PF) 1 % IJ SOLN
INTRAMUSCULAR | Status: AC
  Filled 2018-06-21: qty 30

## 2018-06-21 MED ORDER — GELATIN ABSORBABLE 12-7 MM EX MISC
CUTANEOUS | Status: AC
  Filled 2018-06-21: qty 1

## 2018-06-21 MED ORDER — PROMETHAZINE HCL 25 MG/ML IJ SOLN
INTRAMUSCULAR | Status: AC
  Administered 2018-06-21: 12.5 mg via INTRAVENOUS
  Filled 2018-06-21: qty 1

## 2018-06-21 MED ORDER — SODIUM CHLORIDE 0.9 % IV SOLN: INTRAVENOUS | Status: DC

## 2018-06-21 MED ORDER — MIDAZOLAM HCL 2 MG/2ML IJ SOLN
INTRAMUSCULAR | Status: AC | PRN
  Administered 2018-06-21: 0.5 mg via INTRAVENOUS

## 2018-06-21 NOTE — Procedures (Signed)
Interventional Radiology Procedure Note  Procedure: US guided right liver mass biopsy.  Mx 18g core biopsy.  Complications: None Recommendations:  - Ok to shower tomorrow - Do not submerge for 7 days - advance diet - 2 hour recovery - Routine care   Signed,  Dulcy Fanny. Earleen Newport, DO

## 2018-06-21 NOTE — H&P (Signed)
Chief Complaint: Patient was seen in consultation today for liver lesions/biopsy.  Referring Physician(s): Nicholas Lose  Supervising Physician: Corrie Mckusick  Patient Status: [facility] - Out-pt  History of Present Illness: Sally Brown is a 53 y.o. female with a past medical history of breast cancer. She was unfortunately diagnosed with breast cancer on 06/08/2018. Her cancer is managed by Dr. Lindi Adie. She underwent MR liver and PET scan for her work-up, which both revealed multiple liver lesions.  MR liver 06/07/2018: 1. Numerous hepatic lesions with imaging characteristics most compatible with metastatic disease to the liver, as above.  NM PET 06/14/2018: 1. Enlarged and hypermetabolic porta hepatic and portacaval lymph nodes concerning for metastatic adenopathy. 2. Diffuse, heterogeneous uptake within the liver with subtle areas of mildly increased FDG uptake the corresponding to suspicious lesions identified on MRI concerning for metastatic disease. 3. Benign appearing intermediate attenuating cystic lesion within the right adnexa without corresponding increased uptake. This could be better assessed with pelvic sonogram. Differential considerations include hemorrhagic cyst or endometrioma.  IR consulted by Dr. Lindi Adie for possible image-guided liver lesion biopsy. Patient awake and alert laying in bed with no complaints at this time. Of note, patient states that last time she had versed/fentanyl, she was severely nauseated/vomiting all night. Denies fever, chills, chest pain, dyspnea, abdominal pain, or headache.   Past Medical History:  Diagnosis Date   Family history of breast cancer    PONV (postoperative nausea and vomiting)    SVD (spontaneous vaginal delivery)    x 3    Past Surgical History:  Procedure Laterality Date   BILATERAL SALPINGECTOMY Bilateral 06/01/2017   Procedure: BILATERAL SALPINGECTOMY;  Surgeon: Christophe Louis, MD;  Location: [facility];  Service: Gynecology;   Laterality: Bilateral;   BREAST BIOPSY Left 2009   CHOLECYSTECTOMY  2002   COLONOSCOPY     polyp   CYSTOSCOPY N/A 06/01/2017   Procedure: CYSTOSCOPY;  Surgeon: Bjorn Loser, MD;  Location: [facility];  Service: Urology;  Laterality: N/A;   DILATION AND CURETTAGE OF UTERUS  2005   left arm surgery     nerve relaese   NovaSure Ablation  2006   PUBOVAGINAL SLING N/A 06/01/2017   Procedure: Gaynelle Arabian;  Surgeon: Bjorn Loser, MD;  Location: [facility];  Service: Urology;  Laterality: N/A;   TUBAL LIGATION     VAGINAL HYSTERECTOMY N/A 06/01/2017   Procedure: HYSTERECTOMY VAGINAL;  Surgeon: Christophe Louis, MD;  Location: [facility];  Service: Gynecology;  Laterality: N/A;    Allergies: Codeine and Sulfa antibiotics  Medications: Prior to Admission medications   Medication Sig Start Date End Date Taking? Authorizing Provider  rosuvastatin (CRESTOR) 5 MG tablet Take 5 mg by mouth daily. 05/24/18  Yes [provider]  tamoxifen (NOLVADEX) 20 MG tablet Take 1 tablet (20 mg total) by mouth daily. 06/06/18  Yes Nicholas Lose, MD     Family History  Problem Relation Age of Onset   Breast cancer Mother 87       again at age 33   Other Maternal Grandmother        died in childbirth   Colon cancer Paternal Grandmother    Breast cancer Other        3 of MGMs sisters    Social History   Socioeconomic History   Marital status: Single    Spouse name: Not on file   Number of children: Not on file   Years of education: Not on file   Highest education level: Not  on file  Occupational History   Not on file  Social Needs   Financial resource strain: Not on file   Food insecurity:    Worry: Not on file    Inability: Not on file   Transportation needs:    Medical: Not on file    Non-medical: Not on file  Tobacco Use   Smoking status: Never Smoker   Smokeless tobacco: Never Used  Substance and Sexual Activity   Alcohol use: Yes    Alcohol/week: 2.0 -  3.0 standard drinks    Types: 2 - 3 Glasses of wine per week   Drug use: Never   Sexual activity: Yes    Birth control/protection: Surgical  Lifestyle   Physical activity:    Days per week: Not on file    Minutes per session: Not on file   Stress: Not on file  Relationships   Social connections:    Talks on phone: Not on file    Gets together: Not on file    Attends religious service: Not on file    Active member of club or organization: Not on file    Attends meetings of clubs or organizations: Not on file    Relationship status: Not on file  Other Topics Concern   Not on file  Social History Narrative   Not on file     Review of Systems: A 12 point ROS discussed and pertinent positives are indicated in the HPI above.  All other systems are negative.  Review of Systems  Constitutional: Negative for chills and fever.  Respiratory: Negative for shortness of breath and wheezing.   Cardiovascular: Negative for chest pain and palpitations.  Gastrointestinal: Negative for abdominal pain.  Neurological: Negative for headaches.  Psychiatric/Behavioral: Negative for behavioral problems and confusion.    Vital Signs: BP (!) 109/58    Pulse (!) 51    Temp 97.6 F (36.4 C) (Tympanic)    Resp 16    LMP 05/17/2017 (Approximate)    SpO2 97%   Physical Exam Vitals signs and nursing note reviewed.  Constitutional:      General: She is not in acute distress.    Appearance: Normal appearance.  Cardiovascular:     Rate and Rhythm: Normal rate and regular rhythm.     Heart sounds: Normal heart sounds. No murmur.  Pulmonary:     Effort: Pulmonary effort is normal. No respiratory distress.     Breath sounds: Normal breath sounds. No wheezing.  Abdominal:     Palpations: Abdomen is soft.  Skin:    General: Skin is warm and dry.  Neurological:     Mental Status: She is alert and oriented to person, place, and time.  Psychiatric:        Mood and Affect: Mood normal.         Behavior: Behavior normal.        Thought Content: Thought content normal.        Judgment: Judgment normal.      MD Evaluation Airway: WNL Heart: WNL Abdomen: WNL Chest/ Lungs: WNL ASA  Classification: 3 Mallampati/Airway Score: One   Imaging: Mr Liver W Wo Contrast  Result Date: 06/07/2018 CLINICAL DATA:  53 year old female with history of new diagnosis of lobular breast carcinoma in situ. Liver lesions noted on recent breast MRI. EXAM: MRI ABDOMEN WITHOUT AND WITH CONTRAST TECHNIQUE: Multiplanar multisequence MR imaging of the abdomen was performed both before and after the administration of intravenous contrast. CONTRAST:  13m MULTIHANCE  GADOBENATE DIMEGLUMINE 529 MG/ML IV SOLN COMPARISON:  No prior abdominal MRI. Breast MRI 05/31/2018. CT the abdomen and pelvis 11/24/2012. FINDINGS: Lower chest: Unremarkable. Hepatobiliary: Numerous hepatic lesions are scattered throughout all aspects of the liver, demonstrating T1 hypointensity, low level T2 hyperintensity, diffusion restriction, and demonstrating low level hypovascular enhancement on post gadolinium imaging, compatible with metastatic disease to the liver. These lesions are best visualized on pre gadolinium T1 vibe images (series 10). Largest of these lesions measure up to 1.7 cm in segment 6 (axial image 60 of series 10). No intra or extrahepatic biliary ductal dilatation. Status post cholecystectomy. Pancreas: No pancreatic mass. No pancreatic ductal dilatation. No pancreatic or peripancreatic fluid or inflammatory changes. Spleen:  Unremarkable. Adrenals/Urinary Tract: Bilateral kidneys and adrenal glands are normal in appearance. No hydroureteronephrosis in the visualized portions of the abdomen. Stomach/Bowel: Visualized portions are unremarkable. Vascular/Lymphatic: No aneurysm identified in the visualized abdominal vasculature. No lymphadenopathy noted in the abdomen. Other: No significant volume of ascites noted in the visualized  portions of the peritoneal cavity. Musculoskeletal: No aggressive appearing osseous lesions are noted in the visualized portions of the skeleton. IMPRESSION: 1. Numerous hepatic lesions with imaging characteristics most compatible with metastatic disease to the liver, as above. Electronically Signed   By: Vinnie Langton M.D.   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 She has family history of breast cancer in her mother who had breast cancer on 2 occasions at ages 59 and 1. She has multiple other family members with breast cancer on her mother's side. 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 LABS:  None. EXAM: BILATERAL BREAST MRI WITH AND WITHOUT CONTRAST TECHNIQUE: Multiplanar, multisequence MR images of both breasts were obtained prior to and following the intravenous administration of 8 ml of Gadavist Three-dimensional MR images were rendered by post-processing of the original MR data on an independent workstation. The three-dimensional MR images were interpreted, and findings are reported in the following complete MRI report for this study. Three dimensional images were evaluated at the independent DynaCad workstation COMPARISON:  Previous exam(s). FINDINGS: Breast composition: b. Scattered fibroglandular tissue. Background parenchymal enhancement: Minimal. Right breast: At the site of biopsy in the lateral posterior right breast, there is an area of homogeneous non mass enhancement with persistent kinetics measuring 2.0 x 1.2 x 0.7 cm (series 6, image 74). The susceptibility artifact from the biopsy marking clip is just inferior to the non mass enhancement. In the lower outer quadrant of the right breast, there is a broad area of regional mild homogeneous non mass enhancement (series 6, image 94), which measures  5.3 x 3.5 x 1.0 cm. This is new from her prior MRI. This region mostly demonstrates persistent kinetics, however there is a tiny focus of plateau and washout along the upper inner margin of this enhancement. Left breast: No mass or abnormal enhancement. Lymph nodes: No abnormal appearing lymph nodes. Ancillary findings: There are numerous T2 bright, T1 dark lesions throughout the visualized liver, more than were seen on the prior MRI. She did have a CT abdomen and pelvis in 2014 where no lesions were seen. IMPRESSION: 1. The biopsy-proven area of LCIS in the lateral posterior right breast measures approximately 2.0 cm. 2. There is a new indeterminate regional area of mild homogeneous non mass enhancement in the  lower outer quadrant of the right breast spanning approximately 5.3 cm. 3.  No evidence of malignancy in the left breast. 4. Numerous indeterminate T2 bright lesions throughout the liver, more than seen previously. No liver lesions were seen previously on her abdominal CT from 2014. RECOMMENDATION: 1. MRI guided biopsy recommended for the regional non mass enhancement in the lower outer right breast. 2. Recommend liver protocol MRI for further evaluation of the liver lesions. 3.  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 BI-RADS CATEGORY  4: Suspicious. Electronically Signed   By: Ammie Ferrier M.D.   On: 06/01/2018 16:54   Nm Pet Image Initial (pi) Skull Base To Thigh  Result Date: 06/15/2018 CLINICAL DATA:  Initial treatment strategy for breast cancer. EXAM: NUCLEAR MEDICINE PET SKULL BASE TO THIGH TECHNIQUE: 8.4 mCi F-18 FDG was injected intravenously. Full-ring PET imaging was performed from the skull base to thigh after the radiotracer. CT data was obtained and used for attenuation correction and anatomic localization. Fasting blood glucose: 76 mg/dl COMPARISON:  MRI 06/07/2018. FINDINGS: Mediastinal blood pool activity: SUV max 3.1 Liver activity: SUV max NA NECK: No  hypermetabolic lymph nodes in the neck. Incidental CT findings: none CHEST: Postsurgical change involving the lateral right breast noted with nonspecific FDG uptake, SUV max 3.12. No hypermetabolic axillary or supraclavicular lymph nodes. No hypermetabolic mediastinal or hilar lymph nodes. No suspicious FDG avid pulmonary nodules. Incidental CT findings: none ABDOMEN/PELVIS: Enlarged FDG avid portacaval lymph node measures 1.5 cm within SUV max of 6.9. Porta hepatic lymph node is enlarged measuring 1.5 cm and has an SUV max of 5.4. There is heterogeneous FDG uptake within both lobes of liver corresponding to suspicious lesions identified on MRI. Within segment 6 of the liver there is a 1.5 cm lower attenuation lesion within SUV max of 4.25. Within the posterior dome there is a focal area of increased uptake which has an SUV max of 4.1. Corresponding lesion on MRI from 06/07/2018 measured 1.2 cm. Incidental CT findings: Within the right adnexal region there is a well-circumscribed mass measuring 3.1 cm and 20 HU without corresponding FDG uptake. Previous hysterectomy. SKELETON: No focal hypermetabolic activity to suggest skeletal metastasis. Incidental CT findings: none IMPRESSION: 1. Enlarged and hypermetabolic porta hepatic and portacaval lymph nodes concerning for metastatic adenopathy. 2. Diffuse, heterogeneous uptake within the liver with subtle areas of mildly increased FDG uptake the corresponding to suspicious lesions identified on MRI concerning for metastatic disease. 3. Benign appearing intermediate attenuating cystic lesion within the right adnexa without corresponding increased uptake. This could be better assessed with pelvic sonogram. Differential considerations include hemorrhagic cyst or endometrioma. Electronically Signed   By: Kerby Moors M.D.   On: 06/15/2018 09:02   US Breast Ltd Uni Right Inc Axilla  Result Date: 05/23/2018 CLINICAL DATA:  Screening recall for a possible right breast  asymmetry. EXAM: DIGITAL DIAGNOSTIC RIGHT MAMMOGRAM WITH CAD AND TOMO ULTRASOUND RIGHT BREAST COMPARISON:  Previous exam(s). ACR Breast Density Category c: The breast tissue is heterogeneously dense, which may obscure small masses. FINDINGS: Spot compression tomosynthesis images through the lateral right breast demonstrates a persistent asymmetry in the upper-outer quadrant measuring just over 2 cm. No underlying masses or areas of distortion are seen. A few circumscribed oval masses are seen in the right breast, similar to 1 seen in the left breast on the screening mammogram. This is compatible with the patient's history of fibroadenomas. Mammographic images were processed with CAD. On physical exam, no suspicious palpable  masses are identified on physical exam of the upper-outer quadrant. Ultrasound of the upper-outer quadrant of the right breast demonstrates no suspicious masses to correspond with the asymmetry identified mammographically. There are 2 oval circumscribed masses in the right breast, 1 at 10 o'clock, 1 cm from the nipple and the other at 9 o'clock, 5 cm from the nipple. These are compatible with fibroadenomas and can be seen on multiple prior mammograms. Patient has history of fibroadenomas, and on the left which was biopsied in March of 2009. A benign lymph node is seen in the right breast at 9 o'clock, 9 cm from the nipple. IMPRESSION: 1. There is a persistent asymmetry in the upper-outer right breast without a sonographic correlate. 2. 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 RECOMMENDATION: Stereotactic biopsy is recommended for the asymmetry in the right breast. Patient is aware that at times asymmetries do not persist at the time of biopsy, in which case the biopsy would be canceled and a one time six-month follow-up right breast mammogram may be performed in lieu of biopsy. The biopsy has been scheduled for 05/29/2018 at 1:45 p.m. I have discussed the  findings and recommendations with the patient. Results were also provided in writing at the conclusion of the visit. If applicable, a reminder letter will be sent to the patient regarding the next appointment. BI-RADS CATEGORY  4: Suspicious. Electronically Signed   By: Ammie Ferrier M.D.   On: 05/23/2018 12:57   Mm Diag Breast Tomo Uni Right  Result Date: 05/23/2018 CLINICAL DATA:  Screening recall for a possible right breast asymmetry. EXAM: DIGITAL DIAGNOSTIC RIGHT MAMMOGRAM WITH CAD AND TOMO ULTRASOUND RIGHT BREAST COMPARISON:  Previous exam(s). ACR Breast Density Category c: The breast tissue is heterogeneously dense, which may obscure small masses. FINDINGS: Spot compression tomosynthesis images through the lateral right breast demonstrates a persistent asymmetry in the upper-outer quadrant measuring just over 2 cm. No underlying masses or areas of distortion are seen. A few circumscribed oval masses are seen in the right breast, similar to 1 seen in the left breast on the screening mammogram. This is compatible with the patient's history of fibroadenomas. Mammographic images were processed with CAD. On physical exam, no suspicious palpable masses are identified on physical exam of the upper-outer quadrant. Ultrasound of the upper-outer quadrant of the right breast demonstrates no suspicious masses to correspond with the asymmetry identified mammographically. There are 2 oval circumscribed masses in the right breast, 1 at 10 o'clock, 1 cm from the nipple and the other at 9 o'clock, 5 cm from the nipple. These are compatible with fibroadenomas and can be seen on multiple prior mammograms. Patient has history of fibroadenomas, and on the left which was biopsied in March of 2009. A benign lymph node is seen in the right breast at 9 o'clock, 9 cm from the nipple. IMPRESSION: 1. There is a persistent asymmetry in the upper-outer right breast without a sonographic correlate. 2. Benign fibroadenomas are  identified in the right breast at 9 and 10 o'clock. RECOMMENDATION: Stereotactic biopsy is recommended for the asymmetry in the right breast. Patient is aware that at times asymmetries do not persist at the time of biopsy, in which case the biopsy would be canceled and a one time six-month follow-up right breast mammogram may be performed in lieu of biopsy. The biopsy has been scheduled for 05/29/2018 at 1:45 p.m. I have discussed the findings and recommendations with the patient. Results were also provided in  writing at the conclusion of the visit. If applicable, a reminder letter will be sent to the patient regarding the next appointment. BI-RADS CATEGORY  4: Suspicious. Electronically Signed   By: Ammie Ferrier M.D.   On: 05/23/2018 12:57   Mm Clip Placement Right  Result Date: 06/12/2018 CLINICAL DATA:  MRI guided biopsy was recommended of an area of non mass enhancement localized to the lower outer quadrant of the right breast on recent breast MRI. The patient has a history of recent stereotactic biopsy of the upper-outer right breast showing lobular carcinoma in situ. MRI biopsy was performed today of the right breast. EXAM: DIAGNOSTIC RIGHT MAMMOGRAM POST MRI BIOPSY COMPARISON:  Previous exam(s). FINDINGS: Mammographic images were obtained following MRI guided biopsy of the outer right breast. Dumbbell-shaped biopsy clip is in the slightly outer and upper right breast and is approximately 1 cm inferior to the coil shaped biopsy clip placed at the time of the stereotactic biopsy and approximately 3 cm medial to the coil shaped biopsy clip. There is a new added density in this region of the right breast consistent with bleeding/hematoma as seen on the MRI images of the right breast performed today after the biopsy. IMPRESSION: The dumbbell-shaped biopsy clip placed today localizes to the upper outer right breast, in the expected region of today's biopsy. As described in the MRI biopsy report, the  discrete but faint area of nonmass enhancement seen in the lower outer quadrant of the right breast on recent breast MRI was much less apparent/defined today, possibly due to breast compression related to biopsy? The conspicuous nonmass enhancement visualized today was biopsied, and is likely more superior than that described on the recent breast MRI. Final Assessment: Post Procedure Mammograms for Marker Placement Electronically Signed   By: Curlene Dolphin M.D.   On: 06/12/2018 13:02   Mm Clip Placement Right  Result Date: 05/29/2018 CLINICAL DATA:  Post biopsy mammogram of the left breast for clip placement. EXAM: DIAGNOSTIC LEFT MAMMOGRAM POST STEREOTACTIC BIOPSY COMPARISON:  Previous exam(s). FINDINGS: Mammographic images were obtained following stereotactic guided biopsy of a left breast asymmetry. The coil shaped biopsy marking clip is appropriately positioned in the upper-outer quadrant of the right breast. IMPRESSION: Appropriate positioning of the coil shaped biopsy marking clip in the upper-outer right breast. Final Assessment: Post Procedure Mammograms for Marker Placement Electronically Signed   By: Ammie Ferrier M.D.   On: 05/29/2018 14:19   Mm Rt Breast Bx W Loc Dev 1st Lesion Image Bx Spec Stereo Guide  Addendum Date: 05/31/2018   ADDENDUM REPORT: 05/31/2018 09:05 ADDENDUM: Pathology revealed LOBULAR NEOPLASIA (LOBULAR CARCINOMA IN SITU) of the RIGHT breast, upper outer quadrant. This was found to be concordant by Dr. Ammie Ferrier. Pathology results were discussed with the patient by telephone. The patient reported doing well after the biopsy with minimal bleeding and tenderness at the site. Post biopsy instructions and care were reviewed and questions were answered. The patient was encouraged to call [facility] for any additional concerns. Bilateral breast MRI recommended for patient's elevated lifetime risk of breast carcinoma. Breast MRI is scheduled for  May 31, 2018. Surgical consultation has been arranged with Dr. Rolm Bookbinder at [facility] on June 22, 2018. Pathology results reported by Stacie Acres, RN on 05/31/2018. Electronically Signed   By: Ammie Ferrier M.D.   On: 05/31/2018 09:05   Result Date: 05/31/2018 CLINICAL DATA:  53 year old female presenting for stereotactic biopsy of an asymmetry in the  right breast. EXAM: RIGHT BREAST STEREOTACTIC CORE NEEDLE BIOPSY COMPARISON:  Previous exams. FINDINGS: The patient and I discussed the procedure of stereotactic-guided biopsy including benefits and alternatives. We discussed the high likelihood of a successful procedure. We discussed the risks of the procedure including infection, bleeding, tissue injury, clip migration, and inadequate sampling. Informed written consent was given. The usual time out protocol was performed immediately prior to the procedure. Using sterile technique and 1% Lidocaine as local anesthetic, under stereotactic guidance, a 9 gauge vacuum assisted device was used to perform core needle biopsy of an asymmetry in the upper-outer right breast using a superior approach. Lesion quadrant: Upper-outer quadrant At the conclusion of the procedure, a coil shaped tissue marker clip was deployed into the biopsy cavity. Follow-up 2-view mammogram was performed and dictated separately. IMPRESSION: Stereotactic-guided biopsy of an asymmetry in the upper-outer right breast. No apparent complications. Electronically Signed: By: Ammie Ferrier M.D. On: 05/29/2018 14:22   Mr Rt Breast Bx Johnella Moloney Dev 1st Lesion Image Bx Spec Mr Guide  Addendum Date: 06/14/2018   ADDENDUM REPORT: 06/13/2018 14:04 ADDENDUM: Pathology revealed FIBROCYSTIC CHANGES, PREVIOUS BIOPSY SITE of the Right breast, outer posterior, (dumbbell clip). This was found to be concordant by Dr. Curlene Dolphin, however the area of faint enhancement biopsied under mri is more superior than the area described on recent  diagnostic breast mri. The intent was to biopsy a faint area of nonmass enhancement seen on recent diagnostic mri in the lower outer right breast, inferior to the stereotactic biopsy. When images were obtained on mri on the day of biopsy, the previously visualized area of nonmass enhancement was not as obviously seen, possibly due to differences with breast in compression during MRI for biopsy versus no compression for diagnostic MRI? Although the biopsy performed was inferior to the stereotactic biopsy, the post clip mammogram demonstrates that the MRI guided biopsy was of the upper outer right breast and not of the lower outer right breast. I discussed these findings by telephone with Dr. Lindi Adie on June 13, 2018. After the patient's further work-up with PET-CT and possible liver biopsy, a second attempt at a right breast mri-guided biopsy could be considered. Pathology results were discussed with the patient by telephone. The patient reported doing well after the biopsy with tenderness at the site. Post biopsy instructions and care were reviewed and questions were answered. The patient was encouraged to call [facility] for any additional concerns. The patient is scheduled for a PET, Skull Base to Thigh, on June 14, 2018. Further recommendations will be guided by the results of this imaging study. Pathology results reported by Terie Purser, RN on 06/13/2018. Electronically Signed   By: Curlene Dolphin M.D.   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 She subsequently had an MRI of the breast, showing a new indeterminate regional area of mild nonmass enhancement in the outer right breast, localizing in the outer right breast, inferior to the stereotactic biopsy in the lower outer quadrant. MRI guided biopsy was  recommended of this. Additionally, liver lesions were identified, subsequently evaluated with liver MRI, and are suspicious for metastatic disease. EXAM: MRI GUIDED CORE NEEDLE BIOPSY OF THE RIGHT BREAST TECHNIQUE: Multiplanar, multisequence MR imaging of the right breast was performed both before and after administration of intravenous contrast. CONTRAST:  5 mL of Gadavist  COMPARISON:  Previous exams. FINDINGS: I met with the patient, and we discussed the procedure of MRI guided biopsy, including risks, benefits, and alternatives. Specifically, we discussed the risks of infection, bleeding, tissue injury, clip migration, and inadequate sampling. Informed, written consent was given. The usual time out protocol was performed immediately prior to the procedure. Using sterile technique, 1% Lidocaine with and without epinephrine, MRI guidance, and a 9 gauge vacuum assisted device, biopsy was performed of an area of nonmass enhancement in the outer right breast that was inferior to the recent stereotactic biopsy. Please note that the pattern and distribution of nonmass enhancement in the right breast today, with the breast in compression was not as discrete or localized as it appeared to be on the breast MRI of May 31, 2018. Biopsy was performed using a lateral approach. At the conclusion of the procedure, a dumbbell tissue marker clip was deployed into the biopsy cavity. Follow-up 2-view mammogram was performed and dictated separately. IMPRESSION: MRI guided biopsy of the outer right breast. Post biopsy MRI images show a hematoma in the posterior right breast. Hematoma measures approximately 3.4 x 3.6 cm on the post biopsy MRI images. The biopsy clip is approximately 5 mm superior to and 8 mm medial to the center of the biopsy trough on these images. Electronically Signed: By: Curlene Dolphin M.D. On: 06/12/2018 12:51    Labs:  CBC: Recent Labs    06/21/18 1112  WBC 9.0  HGB 13.0  HCT 38.9  PLT 317     COAGS: Recent Labs    06/21/18 1112  INR 1.1  APTT 30     Assessment and Plan:  Liver lesions. Plan for image-guided liver lesion biopsy today with Dr. Earleen Newport. Patient is NPO. Afebrile and WBCs WNL. She does not take blood thinners. INR 1.1 today. Patient states that last time she had versed/fentanyl, she was severely nauseated/vomiting all night. Discussed with Dr. Earleen Newport who recommends phenergan IV with procedure today.  Risks and benefits discussed with the patient including, but not limited to bleeding, infection, damage to adjacent structures or low yield requiring additional tests. All of the patient's questions were answered, patient is agreeable to proceed. Consent signed and in chart.    Thank you for this interesting consult.  I greatly enjoyed meeting Sally Brown and look forward to participating in their care.  A copy of this report was sent to the requesting provider on this date.  Electronically Signed: Earley Abide, PA-C 06/21/2018, 12:24 PM   I spent a total of 40 Minutes in face to face in clinical consultation, greater than 50% of which was counseling/coordinating care for liver lesions/biopsy.

## 2018-06-21 NOTE — Discharge Instructions (Signed)
Liver Biopsy, Care After  These instructions give you information about how to care for yourself after your procedure. Your health care provider may also give you more specific instructions. If you have problems or questions, contact your health care provider.  What can I expect after the procedure?  After your procedure, it is common to have:  · Pain and soreness in the area where the biopsy was done.  · Bruising around the area where the biopsy was done.  · Sleepiness and fatigue for 1-2 days.  Follow these instructions at home:  Medicines  · Take over-the-counter and prescription medicines only as told by your health care provider.  · If you were prescribed an antibiotic medicine, take it as told by your health care provider. Do not stop taking the antibiotic even if you start to feel better.  · Do not take medicines such as aspirin and ibuprofen unless your health care provider tells you to take them. These medicines thin your blood and can increase the risk of bleeding.  · If you are taking prescription pain medicine, take actions to prevent or treat constipation. Your health care provider may recommend that you:  ? Drink enough fluid to keep your urine pale yellow.  ? Eat foods that are high in fiber, such as fresh fruits and vegetables, whole grains, and beans.  ? Limit foods that are high in fat and processed sugars, such as fried or sweet foods.  ? Take an over-the-counter or prescription medicine for constipation.  Incision care  · Follow instructions from your health care provider about how to take care of your incision. Make sure you:  ? Wash your hands with soap and water before you change your bandage (dressing). If soap and water are not available, use hand sanitizer.  ? Change your dressing as told by your health care provider.  ? Leave stitches (sutures), skin glue, or adhesive strips in place. These skin closures may need to stay in place for 2 weeks or longer. If adhesive strip edges start to  loosen and curl up, you may trim the loose edges. Do not remove adhesive strips completely unless your health care provider tells you to do that.  · Check your incision area every day for signs of infection. Check for:  ? Redness, swelling, or pain.  ? Fluid or blood.  ? Warmth.  ? Pus or a bad smell.  · Do not take baths, swim, or use a hot tub until your health care provider says it is okay to do so.  Activity    · Rest at home for 1-2 days, or as directed by your health care provider.  ? Avoid sitting for a long time without moving. Get up to take short walks every 1-2 hours. This is important to improve blood flow and breathing. Ask for help if you feel weak or unsteady.  · Return to your normal activities as told by your health care provider. Ask your health care provider what activities are safe for you.  · Do not drive or use heavy machinery while taking prescription pain medicine.  · Do not lift anything that is heavier than 10 lb (4.5 kg), or the limit that your health care provider tells you, until he or she says that it is safe.  · Do not play contact sports for 2 weeks after the procedure.  General instructions    · Do not drink alcohol in the first week after the procedure.  · Have   someone stay with you for at least 24 hours after the procedure.  · It is your responsibility to obtain your test results. Ask your health care provider, or the department that is doing the test:  ? When will my results be ready?  ? How will I get my results?  ? What are my treatment options?  ? What other tests do I need?  ? What are my next steps?  · Keep all follow-up visits as told by your health care provider. This is important.  Contact a health care provider if:  · You have increased bleeding from an incision, resulting in more than a small spot of blood.  · You have redness, swelling, or increasing pain in any incisions.  · You notice a discharge or a bad smell coming from any of your incisions.  · You have a fever or  chills.  Get help right away if:  · You develop swelling, bloating, or pain in your abdomen.  · You become dizzy or faint.  · You develop a rash.  · You have nausea or you vomit.  · You faint, or you have shortness of breath or difficulty breathing.  · You develop chest pain.  · You have problems with your speech or vision.  · You have trouble with your balance or moving your arms or legs.  Summary  · After the liver biopsy, it is common to have pain, soreness, and bruising in the area, as well as sleepiness and fatigue.  · Take over-the-counter and prescription medicines only as told by your health care provider.  · Follow instructions from your health care provider about how to care for your incision. Check the incision area daily for signs of infection.  This information is not intended to replace advice given to you by your health care provider. Make sure you discuss any questions you have with your health care provider.  Document Released: 07/17/2004 Document Revised: 01/07/2017 Document Reviewed: 01/07/2017  Elsevier Interactive Patient Education © 2019 [employer]

## 2018-06-22 ENCOUNTER — Other Ambulatory Visit: Payer: No Typology Code available for payment source

## 2018-06-23 ENCOUNTER — Telehealth: Payer: Self-pay | Admitting: Hematology and Oncology

## 2018-06-23 DIAGNOSIS — C7A8 Other malignant neuroendocrine tumors: Secondary | ICD-10-CM

## 2018-06-23 DIAGNOSIS — C7B8 Other secondary neuroendocrine tumors: Secondary | ICD-10-CM

## 2018-06-23 NOTE — Telephone Encounter (Signed)
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 I informed Dr. Donne Hazel of this result. I discussed the case with Dr. Learta Codding who requested that I order an NET spot scan as well as obtain chromogranin A. I will make an appointment for the patient to see him as follow-up. I will also send a message to Dr. Watt Climes

## 2018-06-26 ENCOUNTER — Telehealth: Payer: Self-pay | Admitting: Nurse Practitioner

## 2018-06-26 NOTE — Telephone Encounter (Signed)
A new patient appt has been scheduled for the to see Sally Brown on 6/30 at 915am. Pt was anxious and wanted an appt sooner rather than later. Msg sent to Dr. Benay Spice and Lattie Haw to make sure the date and time was ok.

## 2018-06-27 ENCOUNTER — Telehealth: Payer: Self-pay | Admitting: Oncology

## 2018-06-27 ENCOUNTER — Telehealth: Payer: Self-pay

## 2018-06-27 ENCOUNTER — Telehealth: Payer: Self-pay | Admitting: Nurse Practitioner

## 2018-06-27 NOTE — Telephone Encounter (Signed)
Received a staff msg from [facility] to r/s the pt's appt to 6/24 at 10:15am. I cld and lft the pt a vm w/the new appt date and time. I asked that she callback to confirm

## 2018-06-27 NOTE — Telephone Encounter (Signed)
Pt cld back to confirm appt on 6/24 at 10:15am.

## 2018-06-27 NOTE — Telephone Encounter (Signed)
Called and spoke with Sally Brown to introduce myself and to provide my direct phone number. Reviewed appointments. Encouraged her to call with questions or concerns.

## 2018-06-27 NOTE — Telephone Encounter (Signed)
Created in error

## 2018-07-04 ENCOUNTER — Telehealth: Payer: Self-pay

## 2018-07-05 ENCOUNTER — Inpatient Hospital Stay
Payer: No Typology Code available for payment source | Attending: Hematology and Oncology | Admitting: Nurse Practitioner

## 2018-07-05 ENCOUNTER — Encounter: Payer: Self-pay | Admitting: Nurse Practitioner

## 2018-07-05 ENCOUNTER — Other Ambulatory Visit: Payer: Self-pay

## 2018-07-05 ENCOUNTER — Inpatient Hospital Stay: Payer: No Typology Code available for payment source

## 2018-07-05 VITALS — BP 103/60 | HR 54 | Temp 98.4°F | Resp 18 | Ht 62.0 in | Wt 166.1 lb

## 2018-07-05 DIAGNOSIS — C7A8 Other malignant neuroendocrine tumors: Secondary | ICD-10-CM

## 2018-07-05 DIAGNOSIS — Z79899 Other long term (current) drug therapy: Secondary | ICD-10-CM

## 2018-07-05 DIAGNOSIS — Z801 Family history of malignant neoplasm of trachea, bronchus and lung: Secondary | ICD-10-CM | POA: Diagnosis not present

## 2018-07-05 DIAGNOSIS — Z79810 Long term (current) use of selective estrogen receptor modulators (SERMs): Secondary | ICD-10-CM | POA: Diagnosis not present

## 2018-07-05 DIAGNOSIS — R599 Enlarged lymph nodes, unspecified: Secondary | ICD-10-CM | POA: Diagnosis not present

## 2018-07-05 DIAGNOSIS — Z170 Estrogen receptor positive status [ER+]: Secondary | ICD-10-CM | POA: Diagnosis not present

## 2018-07-05 DIAGNOSIS — C7A00 Malignant carcinoid tumor of unspecified site: Secondary | ICD-10-CM | POA: Insufficient documentation

## 2018-07-05 DIAGNOSIS — D0501 Lobular carcinoma in situ of right breast: Secondary | ICD-10-CM | POA: Insufficient documentation

## 2018-07-05 DIAGNOSIS — C7B8 Other secondary neuroendocrine tumors: Secondary | ICD-10-CM

## 2018-07-05 DIAGNOSIS — Z803 Family history of malignant neoplasm of breast: Secondary | ICD-10-CM | POA: Diagnosis not present

## 2018-07-05 DIAGNOSIS — C7B02 Secondary carcinoid tumors of liver: Secondary | ICD-10-CM | POA: Insufficient documentation

## 2018-07-05 LAB — CMP (CANCER CENTER ONLY)
ALT: 21 U/L (ref 0–44)
AST: 19 U/L (ref 15–41)
Albumin: 4.1 g/dL (ref 3.5–5.0)
Alkaline Phosphatase: 64 U/L (ref 38–126)
Anion gap: 9 (ref 5–15)
BUN: 11 mg/dL (ref 6–20)
CO2: 27 mmol/L (ref 22–32)
Calcium: 9 mg/dL (ref 8.9–10.3)
Chloride: 106 mmol/L (ref 98–111)
Creatinine: 0.77 mg/dL (ref 0.44–1.00)
GFR, Est AFR Am: >60 mL/min (ref >60)
GFR, Estimated: >60 mL/min (ref >60)
Glucose, Bld: 90 mg/dL (ref 70–99)
Potassium: 4.3 mmol/L (ref 3.5–5.1)
Sodium: 142 mmol/L (ref 135–145)
Total Bilirubin: 0.4 mg/dL (ref 0.3–1.2)
Total Protein: 7.2 g/dL (ref 6.5–8.1)

## 2018-07-05 NOTE — Telephone Encounter (Signed)
Went over pre-screening questions with patient and is aware of restrictions

## 2018-07-05 NOTE — Progress Notes (Signed)
New Hematology/Oncology Consult   Requesting MD: Dr. Lindi Adie  [phone]      Reason for Consult: Metastatic carcinoid  HPI: Sally Brown is a 53 year old woman who was found to have liver lesions while undergoing evaluation for lobular carcinoma in situ.  Breast MRI on 06/08/2018 showed numerous T2 lesions throughout the liver.  MRI of the liver on 06/07/2018 showed numerous hepatic lesions scattered throughout all aspects of the liver. She had a PET scan on 06/14/2018 with findings of enlarged and hypermetabolic perihepatic and portacaval lymph nodes; diffuse heterogeneous uptake within the liver with subtle areas of mildly increased FDG uptake corresponding to suspicious lesions identified on MRI.  She underwent biopsy of a right liver lobe mass on 06/21/2018 with pathology showing low-grade neuroendocrine tumor consistent with metastasis, positive CD56, chromogranin, synaptophysin, CDX-2 and cytokeratin AE1/AE3.  She is scheduled for a dotatate scan later this week.    Past Medical History:  Diagnosis Date  . Family history of breast cancer   . PONV (postoperative nausea and vomiting)   . SVD (spontaneous vaginal delivery)    x 3  :  Past Surgical History:  Procedure Laterality Date  . BILATERAL SALPINGECTOMY Bilateral 06/01/2017   Procedure: BILATERAL SALPINGECTOMY;  Surgeon: Christophe Louis, MD;  Location: [facility];  Service: Gynecology;  Laterality: Bilateral;  . BREAST BIOPSY Left 2009  . CHOLECYSTECTOMY  2002  . COLONOSCOPY     polyp  . CYSTOSCOPY N/A 06/01/2017   Procedure: CYSTOSCOPY;  Surgeon: Bjorn Loser, MD;  Location: [facility];  Service: Urology;  Laterality: N/A;  . DILATION AND CURETTAGE OF UTERUS  2005  . left arm surgery     nerve relaese  . NovaSure Ablation  2006  . PUBOVAGINAL SLING N/A 06/01/2017   Procedure: Gaynelle Arabian;  Surgeon: Bjorn Loser, MD;  Location: [facility];  Service: Urology;  Laterality: N/A;  . TUBAL LIGATION    . VAGINAL HYSTERECTOMY N/A  06/01/2017   Procedure: HYSTERECTOMY VAGINAL;  Surgeon: Christophe Louis, MD;  Location: [facility];  Service: Gynecology;  Laterality: N/A;  :   Current Outpatient Medications:  .  rosuvastatin (CRESTOR) 5 MG tablet, Take 5 mg by mouth daily., Disp: , Rfl:  .  tamoxifen (NOLVADEX) 20 MG tablet, Take 1 tablet (20 mg total) by mouth daily., Disp: 90 tablet, Rfl: 3:  :  Allergies  Allergen Reactions  . Codeine Hives  . Sulfa Antibiotics Hives  :  FH: Mother deceased, breast cancer and lung cancer; father deceased, dementia; sister with ovarian cyst; paternal grandmother with colon cancer; breast cancer in multiple relatives on her mother side of the family, she is unsure which relatives  SOCIAL HISTORY: She lives in [place].  She is single.  She has 3 healthy children.  She works at [employer].  No tobacco use.  EtOH reported as a few beers on the weekend.  No tobacco use.  She has never had a blood transfusion.  Review of Systems: No weight loss or anorexia.  No fevers or sweats.  She denies pain.  No unusual headaches or vision change.  No dysphagia.  No cough or shortness of breath.  No chest pain.  No nausea or vomiting.  She developed constipation about a year ago following a hysterectomy for endometriosis/fibroids.  No rectal bleeding.  No urinary symptoms.  No numbness or tingling in the hands or feet.  Blood pressure 103/60, pulse (!) 54, temperature 98.4 F (36.9 C), temperature source Oral, resp. rate 18, height 5' 2"  (  1.575 m), weight 166 lb 1.6 oz (75.3 kg), last menstrual period 05/17/2017, SpO2 96 %.  HEENT: PERRLA.  Sclera anicteric. Lungs: Lungs clear bilaterally. Cardiac: Regular rate and rhythm. Abdomen: Abdomen soft and nontender.  No hepatomegaly.  No mass. Vascular: No leg edema. Lymph nodes: No palpable cervical, supraclavicular, axillary or inguinal lymph nodes. Neurologic: Alert and oriented.  Motor strength 5/5.  Knee DTRs 2+, symmetric. Skin: No  rash.  LABS:  No results for input(s): WBC, HGB, HCT, PLT in the last 72 hours.  No results for input(s): NA, K, CL, CO2, GLUCOSE, BUN, CREATININE, CALCIUM in the last 72 hours.    RADIOLOGY:  Mr Liver W Wo Contrast  Result Date: 06/07/2018 CLINICAL DATA:  53 year old female with history of new diagnosis of lobular breast carcinoma in situ. Liver lesions noted on recent breast MRI. EXAM: MRI ABDOMEN WITHOUT AND WITH CONTRAST TECHNIQUE: Multiplanar multisequence MR imaging of the abdomen was performed both before and after the administration of intravenous contrast. CONTRAST:  64m MULTIHANCE GADOBENATE DIMEGLUMINE 529 MG/ML IV SOLN COMPARISON:  No prior abdominal MRI. Breast MRI 05/31/2018. CT the abdomen and pelvis 11/24/2012. FINDINGS: Lower chest: Unremarkable. Hepatobiliary: Numerous hepatic lesions are scattered throughout all aspects of the liver, demonstrating T1 hypointensity, low level T2 hyperintensity, diffusion restriction, and demonstrating low level hypovascular enhancement on post gadolinium imaging, compatible with metastatic disease to the liver. These lesions are best visualized on pre gadolinium T1 vibe images (series 10). Largest of these lesions measure up to 1.7 cm in segment 6 (axial image 60 of series 10). No intra or extrahepatic biliary ductal dilatation. Status post cholecystectomy. Pancreas: No pancreatic mass. No pancreatic ductal dilatation. No pancreatic or peripancreatic fluid or inflammatory changes. Spleen:  Unremarkable. Adrenals/Urinary Tract: Bilateral kidneys and adrenal glands are normal in appearance. No hydroureteronephrosis in the visualized portions of the abdomen. Stomach/Bowel: Visualized portions are unremarkable. Vascular/Lymphatic: No aneurysm identified in the visualized abdominal vasculature. No lymphadenopathy noted in the abdomen. Other: No significant volume of ascites noted in the visualized portions of the peritoneal cavity. Musculoskeletal: No  aggressive appearing osseous lesions are noted in the visualized portions of the skeleton. IMPRESSION: 1. Numerous hepatic lesions with imaging characteristics most compatible with metastatic disease to the liver, as above. Electronically Signed   By: DVinnie LangtonM.D.   On: 06/07/2018 16:03   Nm Pet Image Initial (pi) Skull Base To Thigh  Result Date: 06/15/2018 CLINICAL DATA:  Initial treatment strategy for breast cancer. EXAM: NUCLEAR MEDICINE PET SKULL BASE TO THIGH TECHNIQUE: 8.4 mCi F-18 FDG was injected intravenously. Full-ring PET imaging was performed from the skull base to thigh after the radiotracer. CT data was obtained and used for attenuation correction and anatomic localization. Fasting blood glucose: 76 mg/dl COMPARISON:  MRI 06/07/2018. FINDINGS: Mediastinal blood pool activity: SUV max 3.1 Liver activity: SUV max NA NECK: No hypermetabolic lymph nodes in the neck. Incidental CT findings: none CHEST: Postsurgical change involving the lateral right breast noted with nonspecific FDG uptake, SUV max 3.12. No hypermetabolic axillary or supraclavicular lymph nodes. No hypermetabolic mediastinal or hilar lymph nodes. No suspicious FDG avid pulmonary nodules. Incidental CT findings: none ABDOMEN/PELVIS: Enlarged FDG avid portacaval lymph node measures 1.5 cm within SUV max of 6.9. Porta hepatic lymph node is enlarged measuring 1.5 cm and has an SUV max of 5.4. There is heterogeneous FDG uptake within both lobes of liver corresponding to suspicious lesions identified on MRI. Within segment 6 of the liver there is a 1.5 cm  lower attenuation lesion within SUV max of 4.25. Within the posterior dome there is a focal area of increased uptake which has an SUV max of 4.1. Corresponding lesion on MRI from 06/07/2018 measured 1.2 cm. Incidental CT findings: Within the right adnexal region there is a well-circumscribed mass measuring 3.1 cm and 20 HU without corresponding FDG uptake. Previous hysterectomy.  SKELETON: No focal hypermetabolic activity to suggest skeletal metastasis. Incidental CT findings: none IMPRESSION: 1. Enlarged and hypermetabolic porta hepatic and portacaval lymph nodes concerning for metastatic adenopathy. 2. Diffuse, heterogeneous uptake within the liver with subtle areas of mildly increased FDG uptake the corresponding to suspicious lesions identified on MRI concerning for metastatic disease. 3. Benign appearing intermediate attenuating cystic lesion within the right adnexa without corresponding increased uptake. This could be better assessed with pelvic sonogram. Differential considerations include hemorrhagic cyst or endometrioma. Electronically Signed   By: Kerby Moors M.D.   On: 06/15/2018 09:02   US Biopsy (liver)  Result Date: 06/21/2018 INDICATION: 53 year old female with a history of breast carcinoma and multiple new liver lesions concerning for breast metastases EXAM: ULTRASOUND-GUIDED LIVER MASS BIOPSY MEDICATIONS: None. ANESTHESIA/SEDATION: Moderate (conscious) sedation was employed during this procedure. A total of Versed 0.5 mg and Fentanyl 25 mcg, 12.5 mg Phenergan was administered intravenously. Moderate Sedation Time: 13 minutes. The patient's level of consciousness and vital signs were monitored continuously by radiology nursing throughout the procedure under my direct supervision. FLUOROSCOPY TIME:  Ultrasound COMPLICATIONS: None PROCEDURE: The procedure, risks, benefits, and alternatives were explained to the patient. Questions regarding the procedure were encouraged and answered. The patient understands and consents to the procedure. Ultrasound survey of the right liver lobe performed with images stored and sent to PACs. The right lower thorax/right upper abdomen was prepped with chlorhexidine in a sterile fashion, and a sterile drape was applied covering the operative field. A sterile gown and sterile gloves were used for the procedure. Local anesthesia was provided  with 1% Lidocaine. The patient is prepped and draped sterilely and the skin and subcutaneous tissues were generously infiltrated with 1% lidocaine. A 17 gauge introducer needle was advanced under ultrasound guidance in an intercostal location into the right liver lobe, targeting hyperechoic lesion of the right liver. Once we confirmed location with ultrasound, the stylet was removed, and multiple separate 18 gauge core biopsy were retrieved. Samples were placed into formalin for transportation to the lab. Three separate Gel-Foam pledgets were then infused with a small amount of saline for assistance with hemostasis. The needle was removed, and a final ultrasound image was performed. The patient tolerated the procedure well and remained hemodynamically stable throughout. No complications were encountered and no significant blood loss was encounter. IMPRESSION: Status post ultrasound-guided biopsy of right liver lobe echogenic mass. Tissue specimen sent to pathology for complete histopathologic analysis. Signed, Dulcy Fanny. Dellia Nims, RPVI [facility] Electronically Signed   By: Corrie Mckusick D.O.   On: 06/21/2018 14:33   Mm Clip Placement Right  Result Date: 06/12/2018 CLINICAL DATA:  MRI guided biopsy was recommended of an area of non mass enhancement localized to the lower outer quadrant of the right breast on recent breast MRI. The patient has a history of recent stereotactic biopsy of the upper-outer right breast showing lobular carcinoma in situ. MRI biopsy was performed today of the right breast. EXAM: DIAGNOSTIC RIGHT MAMMOGRAM POST MRI BIOPSY COMPARISON:  Previous exam(s). FINDINGS: Mammographic images were obtained following MRI guided biopsy of the outer right  breast. Dumbbell-shaped biopsy clip is in the slightly outer and upper right breast and is approximately 1 cm inferior to the coil shaped biopsy clip placed at the time of the stereotactic  biopsy and approximately 3 cm medial to the coil shaped biopsy clip. There is a new added density in this region of the right breast consistent with bleeding/hematoma as seen on the MRI images of the right breast performed today after the biopsy. IMPRESSION: The dumbbell-shaped biopsy clip placed today localizes to the upper outer right breast, in the expected region of today's biopsy. As described in the MRI biopsy report, the discrete but faint area of nonmass enhancement seen in the lower outer quadrant of the right breast on recent breast MRI was much less apparent/defined today, possibly due to breast compression related to biopsy? The conspicuous nonmass enhancement visualized today was biopsied, and is likely more superior than that described on the recent breast MRI. Final Assessment: Post Procedure Mammograms for Marker Placement Electronically Signed   By: Curlene Dolphin M.D.   On: 06/12/2018 13:02   Mr Rt Breast Bx Johnella Moloney Dev 1st Lesion Image Bx Spec Mr Guide  Addendum Date: 06/14/2018   ADDENDUM REPORT: 06/13/2018 14:04 ADDENDUM: Pathology revealed FIBROCYSTIC CHANGES, PREVIOUS BIOPSY SITE of the Right breast, outer posterior, (dumbbell clip). This was found to be concordant by Dr. Curlene Dolphin, however the area of faint enhancement biopsied under mri is more superior than the area described on recent diagnostic breast mri. The intent was to biopsy a faint area of nonmass enhancement seen on recent diagnostic mri in the lower outer right breast, inferior to the stereotactic biopsy. When images were obtained on mri on the day of biopsy, the previously visualized area of nonmass enhancement was not as obviously seen, possibly due to differences with breast in compression during MRI for biopsy versus no compression for diagnostic MRI? Although the biopsy performed was inferior to the stereotactic biopsy, the post clip mammogram demonstrates that the MRI guided biopsy was of the upper outer right breast and  not of the lower outer right breast. I discussed these findings by telephone with Dr. Lindi Adie on June 13, 2018. After the patient's further work-up with PET-CT and possible liver biopsy, a second attempt at a right breast mri-guided biopsy could be considered. Pathology results were discussed with the patient by telephone. The patient reported doing well after the biopsy with tenderness at the site. Post biopsy instructions and care were reviewed and questions were answered. The patient was encouraged to call [facility] for any additional concerns. The patient is scheduled for a PET, Skull Base to Thigh, on June 14, 2018. Further recommendations will be guided by the results of this imaging study. Pathology results reported by Terie Purser, RN on 06/13/2018. Electronically Signed   By: Curlene Dolphin M.D.   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 She subsequently had an MRI of the breast, showing a new indeterminate regional area of mild nonmass enhancement in the outer right breast, localizing in the outer right breast, inferior to the stereotactic biopsy in the lower outer quadrant. MRI guided biopsy was recommended of this. Additionally, liver lesions were identified, subsequently evaluated with liver MRI, and are suspicious for metastatic disease. EXAM: MRI GUIDED CORE NEEDLE BIOPSY OF THE RIGHT BREAST TECHNIQUE: Multiplanar, multisequence MR imaging of  the right breast was performed both before and after administration of intravenous contrast. CONTRAST:  5 mL of Gadavist COMPARISON:  Previous exams. FINDINGS: I met with the patient, and we discussed the procedure of MRI guided biopsy, including risks, benefits, and alternatives. Specifically, we discussed the risks of infection, bleeding, tissue injury, clip  migration, and inadequate sampling. Informed, written consent was given. The usual time out protocol was performed immediately prior to the procedure. Using sterile technique, 1% Lidocaine with and without epinephrine, MRI guidance, and a 9 gauge vacuum assisted device, biopsy was performed of an area of nonmass enhancement in the outer right breast that was inferior to the recent stereotactic biopsy. Please note that the pattern and distribution of nonmass enhancement in the right breast today, with the breast in compression was not as discrete or localized as it appeared to be on the breast MRI of May 31, 2018. Biopsy was performed using a lateral approach. At the conclusion of the procedure, a dumbbell tissue marker clip was deployed into the biopsy cavity. Follow-up 2-view mammogram was performed and dictated separately. IMPRESSION: MRI guided biopsy of the outer right breast. Post biopsy MRI images show a hematoma in the posterior right breast. Hematoma measures approximately 3.4 x 3.6 cm on the post biopsy MRI images. The biopsy clip is approximately 5 mm superior to and 8 mm medial to the center of the biopsy trough on these images. Electronically Signed: By: Curlene Dolphin M.D. On: 06/12/2018 12:51    Assessment and Plan:   1. Metastatic carcinoid involving liver  05/31/2018 breast MRI-numerous T2 lesions throughout the liver.    06/07/2018 MRI of the liver-numerous hepatic lesions scattered throughout all aspects of the liver.   06/14/2018 PET scan-enlarged and hypermetabolic perihepatic and portacaval lymph nodes; diffuse heterogeneous uptake within the liver with subtle areas of mildly increased FDG uptake corresponding to suspicious lesions identified on MRI.    06/21/2018 biopsy of a right liver lobe mass-low-grade neuroendocrine tumor consistent with metastasis, positive CD56, chromogranin, synaptophysin, CDX-2 and cytokeratin AE1/AE3.   2. Lobular carcinoma in situ right breast, on tamoxifen,  followed by Dr. Lindi Adie 3. Hypercholesterolemia  Sally Brown is a 53 year old woman with an incidental finding of metastatic carcinoid involving the liver while undergoing evaluation for lobular carcinoma in situ of the right breast. Dr. Benay Spice reviewed the diagnosis, prognosis and treatment options with her at today's visit.  She understands that no therapy will be curative.  She appears asymptomatic.  She will return to the lab today for a chromogranin A level and chemistry panel. She is scheduled for a Netspot dotatate study later this week.  She is currently on tamoxifen for the lobular carcinoma in situ and is following up with Dr. Donne Hazel regarding surgery.  She will return for a follow-up visit with Dr. Benay Spice in approximately 3 weeks.  Patient seen with Dr. Benay Spice.  60 minutes were spent face-to-face at today's visit with the majority of that time involved in counseling/coordination of care.  MRI and PET scan images were reviewed with her on the computer.  Ned Card, NP 07/05/2018, 10:25 AM   This was a shared visit with Ned Card.  Sally Brown was interviewed and examined.  We reviewed the MRI and PET images with her.  She has been diagnosed with metastatic carcinoid tumor involving the liver.  This is likely from a GI primary.  She is scheduled to undergo a Netspot study later this week.  We discussed the prognosis and treatment options.  She understands no therapy will be curative.  We will consider observation, Sandostatin therapy, and Lutathera.  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  She has started breast cancer prevention therapy with tamoxifen.  Dr. Donne Hazel will consider removal of the right breast LCIS.  I will defer management of the LCIS to Drs. Rande Lawman.  Julieanne Manson, MD

## 2018-07-06 ENCOUNTER — Telehealth: Payer: Self-pay | Admitting: Nurse Practitioner

## 2018-07-06 NOTE — Telephone Encounter (Signed)
No available times for GBS on requested day in [place]. Called and left msg for patient

## 2018-07-07 ENCOUNTER — Other Ambulatory Visit: Payer: No Typology Code available for payment source

## 2018-07-07 ENCOUNTER — Other Ambulatory Visit: Payer: Self-pay

## 2018-07-07 ENCOUNTER — Encounter (HOSPITAL_COMMUNITY)
Admission: RE | Admit: 2018-07-07 | Discharge: 2018-07-07 | Disposition: A | Discharge status: Home / Self Care | Payer: No Typology Code available for payment source | Source: Ambulatory Visit | Attending: Hematology and Oncology | Admitting: Hematology and Oncology

## 2018-07-07 DIAGNOSIS — C7B8 Other secondary neuroendocrine tumors: Secondary | ICD-10-CM | POA: Insufficient documentation

## 2018-07-07 DIAGNOSIS — C7A8 Other malignant neuroendocrine tumors: Secondary | ICD-10-CM | POA: Insufficient documentation

## 2018-07-07 LAB — CHROMOGRANIN A: Chromogranin A (ng/mL): 97.4 ng/mL (ref 0.0–101.8)

## 2018-07-07 MED ORDER — GALLIUM GA 68 DOTATATE IV KIT
4.3000 | PACK | Freq: Once | INTRAVENOUS | Status: AC | PRN
  Administered 2018-07-07: 4.3 via INTRAVENOUS

## 2018-07-11 ENCOUNTER — Ambulatory Visit: Payer: No Typology Code available for payment source | Admitting: Nurse Practitioner

## 2018-07-18 ENCOUNTER — Telehealth: Payer: Self-pay

## 2018-07-18 NOTE — Telephone Encounter (Signed)
Called pt and left VM to offer support and to assess for navigation needs.

## 2018-07-27 ENCOUNTER — Inpatient Hospital Stay
Payer: No Typology Code available for payment source | Attending: Hematology and Oncology | Admitting: Nurse Practitioner

## 2018-07-27 ENCOUNTER — Encounter: Payer: Self-pay | Admitting: Nurse Practitioner

## 2018-07-27 ENCOUNTER — Other Ambulatory Visit: Payer: Self-pay

## 2018-07-27 VITALS — BP 114/74 | HR 61 | Temp 99.1°F | Resp 17 | Ht 62.0 in | Wt 169.3 lb

## 2018-07-27 DIAGNOSIS — Z79899 Other long term (current) drug therapy: Secondary | ICD-10-CM | POA: Insufficient documentation

## 2018-07-27 DIAGNOSIS — C7A00 Malignant carcinoid tumor of unspecified site: Secondary | ICD-10-CM

## 2018-07-27 DIAGNOSIS — Z79810 Long term (current) use of selective estrogen receptor modulators (SERMs): Secondary | ICD-10-CM | POA: Insufficient documentation

## 2018-07-27 DIAGNOSIS — R232 Flushing: Secondary | ICD-10-CM | POA: Insufficient documentation

## 2018-07-27 DIAGNOSIS — C7B01 Secondary carcinoid tumors of distant lymph nodes: Secondary | ICD-10-CM | POA: Diagnosis not present

## 2018-07-27 DIAGNOSIS — E7800 Pure hypercholesterolemia, unspecified: Secondary | ICD-10-CM

## 2018-07-27 DIAGNOSIS — Z170 Estrogen receptor positive status [ER+]: Secondary | ICD-10-CM | POA: Insufficient documentation

## 2018-07-27 DIAGNOSIS — C7A8 Other malignant neuroendocrine tumors: Secondary | ICD-10-CM | POA: Insufficient documentation

## 2018-07-27 DIAGNOSIS — F419 Anxiety disorder, unspecified: Secondary | ICD-10-CM | POA: Diagnosis not present

## 2018-07-27 DIAGNOSIS — C7B8 Other secondary neuroendocrine tumors: Secondary | ICD-10-CM | POA: Insufficient documentation

## 2018-07-27 DIAGNOSIS — C7B02 Secondary carcinoid tumors of liver: Secondary | ICD-10-CM

## 2018-07-27 DIAGNOSIS — D0501 Lobular carcinoma in situ of right breast: Secondary | ICD-10-CM | POA: Insufficient documentation

## 2018-07-27 NOTE — Progress Notes (Addendum)
[facility] OFFICE PROGRESS NOTE   Diagnosis: Metastatic carcinoid  INTERVAL HISTORY:   Ms. Juhasz returns as scheduled.  She is having periodic hot flashes.  She wonders if this is related to tamoxifen.  No diarrhea.  No nausea.  She is experiencing anxiety and having some difficulty sleeping.  Objective:  Vital signs in last 24 hours:  Blood pressure 114/74, pulse 61, temperature 99.1 F (37.3 C), temperature source Oral, resp. rate 17, height 5\' 2"  (1.575 m), weight 169 lb 5 oz (76.8 kg), last menstrual period 05/17/2017, SpO2 98 %.    GI: Abdomen soft.  Mild tenderness at the left abdomen with deep palpation.  No mass.  No hepatomegaly. Vascular: No leg edema.   Lab Results:  Lab Results  Component Value Date   WBC 9.0 06/21/2018   HGB 13.0 06/21/2018   HCT 38.9 06/21/2018   MCV 85.5 06/21/2018   PLT 317 06/21/2018   NEUTROABS 5.6 11/24/2012    Imaging:  No results found.  Medications: I have reviewed the patient's current medications.  Assessment/Plan: 1. Metastatic carcinoid involving liver  05/31/2018 breast MRI-numerous T2 lesions throughout the liver.    06/07/2018 MRI of the liver-numerous hepatic lesions scattered throughout all aspects of the liver.   06/14/2018 PET scan-enlarged and hypermetabolic perihepatic and portacaval lymph nodes; diffuse heterogeneous uptake within the liver with subtle areas of mildly increased FDG uptake corresponding to suspicious lesions identified on MRI.    06/21/2018 biopsy of a right liver lobe mass-low-grade neuroendocrine tumor consistent with metastasis, positive CD56, chromogranin, synaptophysin, CDX-2 and cytokeratin AE1/AE3.   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  Single focus of uptake in the proximal right humerus.  No  clear primary tumor identified. 2. Lobular carcinoma in situ right breast, on tamoxifen, followed by Dr. Lindi Adie 3. Hypercholesterolemia  Disposition: Ms. Avis appears stable.  Dr. Benay Spice reviewed the Netspot result/images with her at today's visit.  She understands there is evidence of the neuroendocrine tumor involving periportal and peripancreatic lymph nodes, multiple liver lesions, a mediastinal lymph node and possibly the right humerus.  No primary tumor was identified.  Dr. Gearldine Shown recommendation is to begin a Somatostatin analog with lanreotide on a monthly schedule.  We reviewed potential toxicities associated with lanreotide including diarrhea, constipation, nausea, bradycardia, hypertension, injection site reaction.  She agrees to proceed.  She will return for the first injection in 1 week.  We will see her in follow-up in 5 weeks.  She will complete a 24-hour 5 HIAA urine collection.  Patient seen with Dr. Benay Spice.  Ned Card ANP/GNP-BC   07/27/2018  1:54 PM This was a shared visit with Ned Card.  We reviewed the Netspot images with Ms. Digangi.  There is no apparent primary tumor site, but I suspect an occult GI primary.  I will present her case at the GI tumor conference.  The consensus recommendation is to not pursue further evaluation for a primary tumor site.  Dr. Donne Hazel does not plan to resect the area of the LCIS.  We discussed treatment options for the carcinoid tumor.  She understands no therapy will be curative.  We discussed observation versus beginning a somatostatin analog.  She agrees to treatment with lanreotide.  We reviewed potential toxicities associated with lanreotide.  She will return at baseline 24-hour urine 5 HIAA. Julieanne Manson, MD

## 2018-07-28 ENCOUNTER — Telehealth: Payer: Self-pay | Admitting: Nurse Practitioner

## 2018-07-28 ENCOUNTER — Telehealth: Payer: Self-pay | Admitting: Hematology and Oncology

## 2018-07-28 NOTE — Telephone Encounter (Signed)
Called and spoke with patient. Confirmed dates and times °

## 2018-07-28 NOTE — Telephone Encounter (Signed)
Scheduled appt per 7/17 sch message - pt aware of apt date and time

## 2018-08-02 NOTE — Progress Notes (Signed)
HEMATOLOGY-ONCOLOGY DOXIMITY VISIT PROGRESS NOTE  I connected with Sally Brown on 08/03/2018 at  8:15 AM EDT by Doximity video conference and verified that I am speaking with the correct person using two identifiers.  I discussed the limitations, risks, security and privacy concerns of performing an evaluation and management service by Doximity and the availability of in person appointments.  I also discussed with the patient that there Brown be a patient responsible charge related to this service. The patient expressed understanding and agreed to proceed.  Patient's Location: Home Physician Location: Clinic  CHIEF COMPLIANT: Follow-up to review scans and discuss direction of therapy  INTERVAL HISTORY: Sally Brown is a 53 y.o. female with above-mentioned history of right breast cancer currently on tamoxifen. Liver MRI on 06/07/18 showed numerous hepatic lesions with imaging characteristics most compatible with metastatic disease to the liver. Breast biopsy of non-mass enhancing lesion in the right breast on 06/12/18 showed fibrocystic changes and no carcinoma. PET scan on 06/14/18 showed enlarged and hypermetabolic porta hepatic and portacaval lymph nodes concerning for metastatic adenopathy and diffuse heterogeneous uptake within the liver. Liver biopsy on 06/21/18 showed low grade neuroendocrine tumor consistent with metastasis. NET spot scan on 07/07/18 showed well differentiated neuroendocrine tumor with metastasis to multiple periportal and peripancreatic lymph nodes, multiple small bilobar hepatic metastasis, no clear primary tumor, solitary middle mediastinal modal metastasis, and a single focus of uptake in the proximal right humerus. She presents over Doximity today to review her scans and discuss treatment options.   Oncology History  Lobular carcinoma in situ (LCIS) of right breast  05/29/2018 Initial Diagnosis   Screening mammogram detected right breast asymmetry, biopsy right breast  UOQ: LCIS, breast MRI: Area of LCIS 2 cm.  New indeterminate non-mass enhancement lower outer quadrant right breast 5.3 cm, T2 lesions throughout the liver    06/06/2018 -  Anti-estrogen oral therapy   Tamoxifen 70m daily   06/13/2018 Genetic Testing   No Pathogenic variants identified. APC c.4709A>G VUS found.  The Common Hereditary Gene Panel offered by [facility] includes sequencing and/or deletion duplication testing of the following 48 genes: APC, ATM, AXIN2, BARD1, BMPR1A, BRCA1, BRCA2, BRIP1, CDH1, CDK4, CDKN2A (p14ARF), CDKN2A (p16INK4a), CHEK2, CTNNA1, DICER1, EPCAM (Deletion/duplication testing only), GREM1 (promoter region deletion/duplication testing only), KIT, MEN1, MLH1, MSH2, MSH3, MSH6, MUTYH, NBN, NF1, NHTL1, PALB2, PDGFRA, PMS2, POLD1, POLE, PTEN, RAD50, RAD51C, RAD51D, RNF43, SDHB, SDHC, SDHD, SMAD4, SMARCA4. STK11, TP53, TSC1, TSC2, and VHL.  The following genes were evaluated for sequence changes only: SDHA and HOXB13 c.251G>A variant only. The report date is June 13, 2018.     REVIEW OF SYSTEMS:   Constitutional: Denies fevers, chills or abnormal weight loss Eyes: Denies blurriness of vision Ears, nose, mouth, throat, and face: Denies mucositis or sore throat Respiratory: Denies cough, dyspnea or wheezes Cardiovascular: Denies palpitation, chest discomfort Gastrointestinal:  Denies nausea, heartburn or change in bowel habits Skin: Denies abnormal skin rashes Lymphatics: Denies new lymphadenopathy or easy bruising Neurological:Denies numbness, tingling or new weaknesses Behavioral/Psych: Mood is stable, no new changes  Extremities: No lower extremity edema Breast: denies any pain or lumps or nodules in either breasts All other systems were reviewed with the patient and are negative.  Observations/Objective:  There were no vitals filed for this visit. There is no height or weight on file to calculate BMI.  I have reviewed the data as listed CMP Latest Ref Rng & Units  07/05/2018 11/24/2012  Glucose 70 - 99 mg/dL 90 112(H)  BUN 6 - 20 mg/dL 11 17  Creatinine 0.44 - 1.00 mg/dL 0.77 0.69  Sodium 135 - 145 mmol/L 142 137  Potassium 3.5 - 5.1 mmol/L 4.3 4.0  Chloride 98 - 111 mmol/L 106 103  CO2 22 - 32 mmol/L 27 24  Calcium 8.9 - 10.3 mg/dL 9.0 9.6  Total Protein 6.5 - 8.1 g/dL 7.2 7.0  Total Bilirubin 0.3 - 1.2 mg/dL 0.4 0.2(L)  Alkaline Phos 38 - 126 U/L 64 63  AST 15 - 41 U/L 19 20  ALT 0 - 44 U/L 21 26    Lab Results  Component Value Date   WBC 9.0 06/21/2018   HGB 13.0 06/21/2018   HCT 38.9 06/21/2018   MCV 85.5 06/21/2018   PLT 317 06/21/2018   NEUTROABS 5.6 11/24/2012      Assessment Plan:  Lobular carcinoma in situ (LCIS) of right breast Current treatment: Tamoxifen for breast reduction 20 mg daily Tamoxifen toxicities: Occasional hot flashes and mood swings I discussed with her that the mood swings do not get better we can consider treating her with Effexor. MRI guided biopsy of the right breast will need to be ordered because the prior biopsy was at a different location. I assume that the MRI guided biopsy will be negative and we will continue our breast reduction with tamoxifen therapy.  Plan: Breast MRI November 2020 Mammograms Brown 2021  I will see her back after the breast MRI in November.  Low-grade metastatic carcinoid tumor: Under the care of Dr. Learta Codding   I discussed the assessment and treatment plan with the patient. The patient was provided an opportunity to ask questions and all were answered. The patient agreed with the plan and demonstrated an understanding of the instructions. The patient was advised to call back or seek an in-person evaluation if the symptoms worsen or if the condition fails to improve as anticipated.   I provided 15 minutes of face-to-face Doximity time during this encounter.    Rulon Eisenmenger, MD 08/03/2018   I, Molly Dorshimer, am acting as scribe for Nicholas Lose, MD.  I have reviewed the  above documentation for accuracy and completeness, and I agree with the above.

## 2018-08-03 ENCOUNTER — Other Ambulatory Visit: Payer: Self-pay | Admitting: Hematology and Oncology

## 2018-08-03 ENCOUNTER — Other Ambulatory Visit: Payer: Self-pay

## 2018-08-03 ENCOUNTER — Other Ambulatory Visit: Payer: Self-pay | Admitting: *Deleted

## 2018-08-03 ENCOUNTER — Inpatient Hospital Stay: Payer: No Typology Code available for payment source

## 2018-08-03 ENCOUNTER — Inpatient Hospital Stay (HOSPITAL_BASED_OUTPATIENT_CLINIC_OR_DEPARTMENT_OTHER): Payer: No Typology Code available for payment source | Admitting: Hematology and Oncology

## 2018-08-03 VITALS — BP 122/81 | HR 54 | Temp 97.8°F | Resp 16

## 2018-08-03 DIAGNOSIS — C7A00 Malignant carcinoid tumor of unspecified site: Secondary | ICD-10-CM | POA: Diagnosis not present

## 2018-08-03 DIAGNOSIS — C7A8 Other malignant neuroendocrine tumors: Secondary | ICD-10-CM

## 2018-08-03 DIAGNOSIS — Z79810 Long term (current) use of selective estrogen receptor modulators (SERMs): Secondary | ICD-10-CM

## 2018-08-03 DIAGNOSIS — Z170 Estrogen receptor positive status [ER+]: Secondary | ICD-10-CM

## 2018-08-03 DIAGNOSIS — R928 Other abnormal and inconclusive findings on diagnostic imaging of breast: Secondary | ICD-10-CM

## 2018-08-03 DIAGNOSIS — D0501 Lobular carcinoma in situ of right breast: Secondary | ICD-10-CM

## 2018-08-03 DIAGNOSIS — C7B8 Other secondary neuroendocrine tumors: Secondary | ICD-10-CM

## 2018-08-03 MED ORDER — LANREOTIDE ACETATE 120 MG/0.5ML ~~LOC~~ SOLN
120.0000 mg | Freq: Once | SUBCUTANEOUS | Status: AC
  Administered 2018-08-03: 120 mg via SUBCUTANEOUS
  Filled 2018-08-03: qty 120

## 2018-08-03 NOTE — Patient Instructions (Signed)
Lanreotide injection What is this medicine? LANREOTIDE (lan REE oh tide) is used to reduce blood levels of growth hormone in patients with a condition called acromegaly. It also works to slow or stop tumor growth in patients with neuroendocrine tumors and treat carcinoid syndrome. This medicine may be used for other purposes; ask your health care provider or pharmacist if you have questions. COMMON BRAND NAME(S): Somatuline [place] What should I tell my health care provider before I take this medicine? They need to know if you have any of these conditions:  diabetes  gallbladder disease  heart disease  kidney disease  liver disease  thyroid disease  an unusual or allergic reaction to lanreotide, other medicines, foods, dyes, or preservatives  pregnant or trying to get pregnant  breast-feeding How should I use this medicine? This medicine is for injection under the skin. It is given by a health care professional in a hospital or clinic setting. Contact your pediatrician or health care professional regarding the use of this medicine in children. Special care may be needed. Overdosage: If you think you have taken too much of this medicine contact a poison control center or emergency room at once. NOTE: This medicine is only for you. Do not share this medicine with others. What if I miss a dose? It is important not to miss your dose. Call your doctor or health care professional if you are unable to keep an appointment. What may interact with this medicine? This medicine may interact with the following medications:  bromocriptine  cyclosporine  certain medicines for blood pressure, heart disease, irregular heart beat  certain medicines for diabetes  quinidine  terfenadine This list may not describe all possible interactions. Give your health care provider a list of all the medicines, herbs, non-prescription drugs, or dietary supplements you use. Also tell them if you smoke,  drink alcohol, or use illegal drugs. Some items may interact with your medicine. What should I watch for while using this medicine? Tell your doctor or healthcare professional if your symptoms do not start to get better or if they get worse. Visit your doctor or health care professional for regular checks on your progress. Your condition will be monitored carefully while you are receiving this medicine. This medicine may increase blood sugar. Ask your healthcare provider if changes in diet or medicines are needed if you have diabetes. You may need blood work done while you are taking this medicine. Women should inform their doctor if they wish to become pregnant or think they might be pregnant. There is a potential for serious side effects to an unborn child. Talk to your health care professional or pharmacist for more information. Do not breast-feed an infant while taking this medicine or for 6 months after stopping it. This medicine has caused ovarian failure in some women. This medicine may interfere with the ability to have a child. Talk with your doctor or health care professional if you are concerned about your fertility. What side effects may I notice from receiving this medicine? Side effects that you should report to your doctor or health care professional as soon as possible:  allergic reactions like skin rash, itching or hives, swelling of the face, lips, or tongue  increased blood pressure  severe stomach pain  signs and symptoms of hgh blood sugar such as being more thirsty or hungry or having to urinate more than normal. You may also feel very tired or have blurry vision.  signs and symptoms of low blood   sugar such as feeling anxious; confusion; dizziness; increased hunger; unusually weak or tired; sweating; shakiness; cold; irritable; headache; blurred vision; fast heartbeat; loss of consciousness  unusually slow heartbeat Side effects that usually do not require medical  attention (report to your doctor or health care professional if they continue or are bothersome):  constipation  diarrhea  dizziness  headache  muscle pain  muscle spasms  nausea  pain, redness, or irritation at site where injected This list may not describe all possible side effects. Call your doctor for medical advice about side effects. You may report side effects to FDA at 1-800-FDA-1088. Where should I keep my medicine? This drug is given in a hospital or clinic and will not be stored at home. NOTE: This sheet is a summary. It may not cover all possible information. If you have questions about this medicine, talk to your doctor, pharmacist, or health care provider.  2020 Elsevier/Gold Standard (2017-10-06 09:13:08)  

## 2018-08-03 NOTE — Assessment & Plan Note (Signed)
Current treatment: Tamoxifen for breast reduction 20 mg daily Tamoxifen toxicities: Occasional hot flashes and mood swings I discussed with her that the mood swings do not get better we can consider treating her with Effexor. MRI guided biopsy of the right breast will need to be ordered because the prior biopsy was at a different location. I assume that the MRI guided biopsy will be negative and we will continue our breast reduction with tamoxifen therapy.  Plan: Breast MRI November 2020 Mammograms May 2021  I will see her back after the breast MRI in November.

## 2018-08-06 LAB — 5 HIAA, QUANTITATIVE, URINE, 24 HOUR
5-HIAA, Ur: 1.2 mg/L
5-HIAA,Quant.,24 Hr Urine: 2.9 mg/24 hr (ref 0.0–14.9)
Total Volume: 2400

## 2018-08-07 ENCOUNTER — Ambulatory Visit
Admission: RE | Admit: 2018-08-07 | Discharge: 2018-08-07 | Disposition: A | Discharge status: Home / Self Care | Payer: No Typology Code available for payment source | Source: Ambulatory Visit | Attending: Hematology and Oncology | Admitting: Hematology and Oncology

## 2018-08-07 ENCOUNTER — Other Ambulatory Visit: Payer: Self-pay

## 2018-08-07 DIAGNOSIS — R928 Other abnormal and inconclusive findings on diagnostic imaging of breast: Secondary | ICD-10-CM

## 2018-08-07 MED ORDER — GADOBUTROL 1 MMOL/ML IV SOLN
7.0000 mL | Freq: Once | INTRAVENOUS | Status: AC | PRN
  Administered 2018-08-07: 7 mL via INTRAVENOUS

## 2018-08-31 ENCOUNTER — Inpatient Hospital Stay: Payer: No Typology Code available for payment source

## 2018-08-31 ENCOUNTER — Other Ambulatory Visit: Payer: Self-pay

## 2018-08-31 ENCOUNTER — Telehealth: Payer: Self-pay | Admitting: Oncology

## 2018-08-31 ENCOUNTER — Inpatient Hospital Stay: Payer: No Typology Code available for payment source | Attending: Hematology and Oncology | Admitting: Oncology

## 2018-08-31 VITALS — BP 129/58 | HR 52 | Temp 98.3°F | Resp 19 | Ht 62.0 in | Wt 173.6 lb

## 2018-08-31 DIAGNOSIS — C7A8 Other malignant neuroendocrine tumors: Secondary | ICD-10-CM

## 2018-08-31 DIAGNOSIS — Z79899 Other long term (current) drug therapy: Secondary | ICD-10-CM | POA: Insufficient documentation

## 2018-08-31 DIAGNOSIS — E7800 Pure hypercholesterolemia, unspecified: Secondary | ICD-10-CM | POA: Diagnosis not present

## 2018-08-31 DIAGNOSIS — Z79810 Long term (current) use of selective estrogen receptor modulators (SERMs): Secondary | ICD-10-CM | POA: Insufficient documentation

## 2018-08-31 DIAGNOSIS — C7A00 Malignant carcinoid tumor of unspecified site: Secondary | ICD-10-CM | POA: Diagnosis not present

## 2018-08-31 DIAGNOSIS — N6011 Diffuse cystic mastopathy of right breast: Secondary | ICD-10-CM | POA: Insufficient documentation

## 2018-08-31 DIAGNOSIS — C787 Secondary malignant neoplasm of liver and intrahepatic bile duct: Secondary | ICD-10-CM | POA: Diagnosis not present

## 2018-08-31 DIAGNOSIS — C7B8 Other secondary neuroendocrine tumors: Secondary | ICD-10-CM | POA: Diagnosis not present

## 2018-08-31 MED ORDER — LANREOTIDE ACETATE 120 MG/0.5ML ~~LOC~~ SOLN
120.0000 mg | Freq: Once | SUBCUTANEOUS | Status: AC
  Administered 2018-08-31: 120 mg via SUBCUTANEOUS
  Filled 2018-08-31: qty 120

## 2018-08-31 NOTE — Progress Notes (Signed)
  Sally Brown OFFICE PROGRESS NOTE   Diagnosis: Metastatic neuroendocrine tumor  INTERVAL HISTORY:   Sally Brown completed a first dose of lanreotide on 08/03/2018.  She is taking tamoxifen.  No hot flashes. A repeat right breast biopsy of an area of non-mass-like enhancement in the right lower breast on 08/07/2018 revealed cystic change and pseudo-angiomatous stromal hyperplasia.  She feels well.  No diarrhea. Objective:  Vital signs in last 24 hours:  Blood pressure (!) 129/58, pulse (!) 52, temperature 98.3 F (36.8 C), temperature source Oral, resp. rate 19, height 5\' 2"  (1.575 m), weight 173 lb 9.6 oz (78.7 kg), last menstrual period 05/17/2017, SpO2 99 %.    Limited physical examination secondary to distancing with the COVID pandemic GI: No hepatomegaly, no mass, nontender Vascular: No leg edema   Lab Results:  Lab Results  Component Value Date   WBC 9.0 06/21/2018   HGB 13.0 06/21/2018   HCT 38.9 06/21/2018   MCV 85.5 06/21/2018   PLT 317 06/21/2018   NEUTROABS 5.6 11/24/2012    CMP  Lab Results  Component Value Date   NA 142 07/05/2018   K 4.3 07/05/2018   CL 106 07/05/2018   CO2 27 07/05/2018   GLUCOSE 90 07/05/2018   BUN 11 07/05/2018   CREATININE 0.77 07/05/2018   CALCIUM 9.0 07/05/2018   PROT 7.2 07/05/2018   ALBUMIN 4.1 07/05/2018   AST 19 07/05/2018   ALT 21 07/05/2018   ALKPHOS 64 07/05/2018   BILITOT 0.4 07/05/2018   GFRNONAA >60 07/05/2018   GFRAA >60 07/05/2018     Medications: I have reviewed the patient's current medications.   Assessment/Plan: 1. Metastatic carcinoid involving liver  05/31/2018 breast MRI-numerous T2 lesions throughout the liver.    06/07/2018 MRI of the liver-numerous hepatic lesions scattered throughout all aspects of the liver.   06/14/2018 PET scan-enlarged and hypermetabolic perihepatic and portacaval lymph nodes; diffuse heterogeneous uptake within the liver with subtle areas of mildly increased  FDG uptake corresponding to suspicious lesions identified on MRI.    06/21/2018 biopsy of a right liver lobe mass-low-grade neuroendocrine tumor consistent with metastasis, positive CD56, chromogranin, synaptophysin, CDX-2 and cytokeratin AE1/AE3.   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  Single focus of uptake in the proximal right humerus.  No clear primary tumor identified.  Normal chromogranin A 07/05/2018 and normal 24-hour urine 5 HIAA 08/03/2018  Monthly lanreotide beginning 08/03/2018 2. Lobular carcinoma in situ right breast, on tamoxifen, followed by Dr. Lindi Brown 3. Hypercholesterolemia    Disposition: Ms. Dade appears stable.  She will continue monthly lanreotide.  She will return for office visit and chromogranin a level in 3 months.  We will plan for a repeat abdomen CT in 4-6 months.  She will continue follow-up with Dr. Lindi Brown for management of LCIS.  Betsy Coder, MD  08/31/2018  8:34 AM

## 2018-08-31 NOTE — Patient Instructions (Signed)
Lanreotide injection What is this medicine? LANREOTIDE (lan REE oh tide) is used to reduce blood levels of growth hormone in patients with a condition called acromegaly. It also works to slow or stop tumor growth in patients with neuroendocrine tumors and treat carcinoid syndrome. This medicine may be used for other purposes; ask your health care provider or pharmacist if you have questions. COMMON BRAND NAME(S): Somatuline [place] What should I tell my health care provider before I take this medicine? They need to know if you have any of these conditions:  diabetes  gallbladder disease  heart disease  kidney disease  liver disease  thyroid disease  an unusual or allergic reaction to lanreotide, other medicines, foods, dyes, or preservatives  pregnant or trying to get pregnant  breast-feeding How should I use this medicine? This medicine is for injection under the skin. It is given by a health care professional in a hospital or clinic setting. Contact your pediatrician or health care professional regarding the use of this medicine in children. Special care may be needed. Overdosage: If you think you have taken too much of this medicine contact a poison control center or emergency room at once. NOTE: This medicine is only for you. Do not share this medicine with others. What if I miss a dose? It is important not to miss your dose. Call your doctor or health care professional if you are unable to keep an appointment. What may interact with this medicine? This medicine may interact with the following medications:  bromocriptine  cyclosporine  certain medicines for blood pressure, heart disease, irregular heart beat  certain medicines for diabetes  quinidine  terfenadine This list may not describe all possible interactions. Give your health care provider a list of all the medicines, herbs, non-prescription drugs, or dietary supplements you use. Also tell them if you smoke,  drink alcohol, or use illegal drugs. Some items may interact with your medicine. What should I watch for while using this medicine? Tell your doctor or healthcare professional if your symptoms do not start to get better or if they get worse. Visit your doctor or health care professional for regular checks on your progress. Your condition will be monitored carefully while you are receiving this medicine. This medicine may increase blood sugar. Ask your healthcare provider if changes in diet or medicines are needed if you have diabetes. You may need blood work done while you are taking this medicine. Women should inform their doctor if they wish to become pregnant or think they might be pregnant. There is a potential for serious side effects to an unborn child. Talk to your health care professional or pharmacist for more information. Do not breast-feed an infant while taking this medicine or for 6 months after stopping it. This medicine has caused ovarian failure in some women. This medicine may interfere with the ability to have a child. Talk with your doctor or health care professional if you are concerned about your fertility. What side effects may I notice from receiving this medicine? Side effects that you should report to your doctor or health care professional as soon as possible:  allergic reactions like skin rash, itching or hives, swelling of the face, lips, or tongue  increased blood pressure  severe stomach pain  signs and symptoms of hgh blood sugar such as being more thirsty or hungry or having to urinate more than normal. You may also feel very tired or have blurry vision.  signs and symptoms of low blood   sugar such as feeling anxious; confusion; dizziness; increased hunger; unusually weak or tired; sweating; shakiness; cold; irritable; headache; blurred vision; fast heartbeat; loss of consciousness  unusually slow heartbeat Side effects that usually do not require medical  attention (report to your doctor or health care professional if they continue or are bothersome):  constipation  diarrhea  dizziness  headache  muscle pain  muscle spasms  nausea  pain, redness, or irritation at site where injected This list may not describe all possible side effects. Call your doctor for medical advice about side effects. You may report side effects to FDA at 1-800-FDA-1088. Where should I keep my medicine? This drug is given in a hospital or clinic and will not be stored at home. NOTE: This sheet is a summary. It may not cover all possible information. If you have questions about this medicine, talk to your doctor, pharmacist, or health care provider.  2020 Elsevier/Gold Standard (2017-10-06 09:13:08)  

## 2018-08-31 NOTE — Telephone Encounter (Signed)
Called and spoke with patient. Confirmed upcoming appts  

## 2018-09-28 ENCOUNTER — Inpatient Hospital Stay: Payer: No Typology Code available for payment source | Attending: Hematology and Oncology

## 2018-09-28 ENCOUNTER — Other Ambulatory Visit: Payer: Self-pay

## 2018-09-28 VITALS — BP 117/78 | HR 52 | Temp 97.3°F | Resp 20

## 2018-09-28 DIAGNOSIS — C7A8 Other malignant neuroendocrine tumors: Secondary | ICD-10-CM

## 2018-09-28 DIAGNOSIS — C7A00 Malignant carcinoid tumor of unspecified site: Secondary | ICD-10-CM | POA: Diagnosis present

## 2018-09-28 DIAGNOSIS — Z79899 Other long term (current) drug therapy: Secondary | ICD-10-CM | POA: Diagnosis not present

## 2018-09-28 DIAGNOSIS — C7B8 Other secondary neuroendocrine tumors: Secondary | ICD-10-CM

## 2018-09-28 MED ORDER — LANREOTIDE ACETATE 120 MG/0.5ML ~~LOC~~ SOLN
120.0000 mg | Freq: Once | SUBCUTANEOUS | Status: AC
  Administered 2018-09-28: 10:00:00 120 mg via SUBCUTANEOUS
  Filled 2018-09-28: qty 120

## 2018-09-28 NOTE — Patient Instructions (Signed)
Lanreotide injection What is this medicine? LANREOTIDE (lan REE oh tide) is used to reduce blood levels of growth hormone in patients with a condition called acromegaly. It also works to slow or stop tumor growth in patients with neuroendocrine tumors and treat carcinoid syndrome. This medicine may be used for other purposes; ask your health care provider or pharmacist if you have questions. COMMON BRAND NAME(S): Somatuline [place] What should I tell my health care provider before I take this medicine? They need to know if you have any of these conditions:  diabetes  gallbladder disease  heart disease  kidney disease  liver disease  thyroid disease  an unusual or allergic reaction to lanreotide, other medicines, foods, dyes, or preservatives  pregnant or trying to get pregnant  breast-feeding How should I use this medicine? This medicine is for injection under the skin. It is given by a health care professional in a hospital or clinic setting. Contact your pediatrician or health care professional regarding the use of this medicine in children. Special care may be needed. Overdosage: If you think you have taken too much of this medicine contact a poison control center or emergency room at once. NOTE: This medicine is only for you. Do not share this medicine with others. What if I miss a dose? It is important not to miss your dose. Call your doctor or health care professional if you are unable to keep an appointment. What may interact with this medicine? This medicine may interact with the following medications:  bromocriptine  cyclosporine  certain medicines for blood pressure, heart disease, irregular heart beat  certain medicines for diabetes  quinidine  terfenadine This list may not describe all possible interactions. Give your health care provider a list of all the medicines, herbs, non-prescription drugs, or dietary supplements you use. Also tell them if you smoke,  drink alcohol, or use illegal drugs. Some items may interact with your medicine. What should I watch for while using this medicine? Tell your doctor or healthcare professional if your symptoms do not start to get better or if they get worse. Visit your doctor or health care professional for regular checks on your progress. Your condition will be monitored carefully while you are receiving this medicine. This medicine may increase blood sugar. Ask your healthcare provider if changes in diet or medicines are needed if you have diabetes. You may need blood work done while you are taking this medicine. Women should inform their doctor if they wish to become pregnant or think they might be pregnant. There is a potential for serious side effects to an unborn child. Talk to your health care professional or pharmacist for more information. Do not breast-feed an infant while taking this medicine or for 6 months after stopping it. This medicine has caused ovarian failure in some women. This medicine may interfere with the ability to have a child. Talk with your doctor or health care professional if you are concerned about your fertility. What side effects may I notice from receiving this medicine? Side effects that you should report to your doctor or health care professional as soon as possible:  allergic reactions like skin rash, itching or hives, swelling of the face, lips, or tongue  increased blood pressure  severe stomach pain  signs and symptoms of hgh blood sugar such as being more thirsty or hungry or having to urinate more than normal. You may also feel very tired or have blurry vision.  signs and symptoms of low blood   sugar such as feeling anxious; confusion; dizziness; increased hunger; unusually weak or tired; sweating; shakiness; cold; irritable; headache; blurred vision; fast heartbeat; loss of consciousness  unusually slow heartbeat Side effects that usually do not require medical  attention (report to your doctor or health care professional if they continue or are bothersome):  constipation  diarrhea  dizziness  headache  muscle pain  muscle spasms  nausea  pain, redness, or irritation at site where injected This list may not describe all possible side effects. Call your doctor for medical advice about side effects. You may report side effects to FDA at 1-800-FDA-1088. Where should I keep my medicine? This drug is given in a hospital or clinic and will not be stored at home. NOTE: This sheet is a summary. It may not cover all possible information. If you have questions about this medicine, talk to your doctor, pharmacist, or health care provider.  2020 Elsevier/Gold Standard (2017-10-06 09:13:08)  

## 2018-09-29 ENCOUNTER — Telehealth: Payer: Self-pay | Admitting: *Deleted

## 2018-09-29 NOTE — Telephone Encounter (Signed)
Questioning what type of imaging she will have in November? MD said to repeat CT scan, but she has never had a CT scan. Will f/u with MD on Monday and obtain clarification and call her back.

## 2018-10-05 NOTE — Telephone Encounter (Signed)
Informed patient that since she had MRI and PET in 2020, he most likely will order MRI to follow liver lesions and not a CT scan. He would like to see her before he orders the scan. She understands and agrees.

## 2018-10-09 ENCOUNTER — Telehealth: Payer: Self-pay

## 2018-10-09 NOTE — Telephone Encounter (Signed)
Pt called requesting assistance with paying for Sandostatin. Her co-pay is $2,200 per injection. I called Theotis Burrow who will initiate drug replacement through [employer]. Patient will fax financials to Rob on 9/29.

## 2018-10-12 ENCOUNTER — Encounter: Payer: Self-pay | Admitting: Oncology

## 2018-10-12 NOTE — Progress Notes (Signed)
Spoke to [employer] w/ [employer] regarding pt's copay assistance application.  She informed me they now have everything needed to process the application and they will contact me by fax with the outcome.

## 2018-10-13 ENCOUNTER — Encounter: Payer: Self-pay | Admitting: Oncology

## 2018-10-13 NOTE — Progress Notes (Signed)
Pt is approved w/ [employer] for [employer] for 1 year from 07/14/18 for $20,000.

## 2018-10-17 ENCOUNTER — Telehealth: Payer: Self-pay | Admitting: *Deleted

## 2018-10-17 NOTE — Telephone Encounter (Signed)
Called to report that her first injection for Lanreotide here in July cost her $2250 out-of-pocket and $352 was a facility fee. She has qualified for grant of $20,000/year from the company. She found an infusion center in [place] called [facility] on [address] [phone]) that does not charge a facility fee for services. Asking if she could receive her injections there if this is something they will provide. She will call back to report if they would accept her.

## 2018-10-26 ENCOUNTER — Other Ambulatory Visit: Payer: Self-pay | Admitting: Oncology

## 2018-10-26 ENCOUNTER — Other Ambulatory Visit: Payer: Self-pay | Admitting: General Surgery

## 2018-10-26 ENCOUNTER — Other Ambulatory Visit: Payer: Self-pay

## 2018-10-26 ENCOUNTER — Inpatient Hospital Stay: Payer: No Typology Code available for payment source | Attending: Hematology and Oncology

## 2018-10-26 VITALS — BP 130/83 | HR 57 | Temp 98.0°F | Resp 18

## 2018-10-26 DIAGNOSIS — C7B02 Secondary carcinoid tumors of liver: Secondary | ICD-10-CM | POA: Insufficient documentation

## 2018-10-26 DIAGNOSIS — C7A00 Malignant carcinoid tumor of unspecified site: Secondary | ICD-10-CM | POA: Diagnosis not present

## 2018-10-26 DIAGNOSIS — C7B8 Other secondary neuroendocrine tumors: Secondary | ICD-10-CM

## 2018-10-26 DIAGNOSIS — C7B01 Secondary carcinoid tumors of distant lymph nodes: Secondary | ICD-10-CM | POA: Diagnosis not present

## 2018-10-26 DIAGNOSIS — D0501 Lobular carcinoma in situ of right breast: Secondary | ICD-10-CM

## 2018-10-26 DIAGNOSIS — C7A8 Other malignant neuroendocrine tumors: Secondary | ICD-10-CM

## 2018-10-26 MED ORDER — LANREOTIDE ACETATE 120 MG/0.5ML ~~LOC~~ SOLN
120.0000 mg | Freq: Once | SUBCUTANEOUS | Status: AC
  Administered 2018-10-26: 120 mg via SUBCUTANEOUS
  Filled 2018-10-26: qty 120

## 2018-10-26 NOTE — Patient Instructions (Signed)
Lanreotide injection What is this medicine? LANREOTIDE (lan REE oh tide) is used to reduce blood levels of growth hormone in patients with a condition called acromegaly. It also works to slow or stop tumor growth in patients with neuroendocrine tumors and treat carcinoid syndrome. This medicine may be used for other purposes; ask your health care provider or pharmacist if you have questions. COMMON BRAND NAME(S): Somatuline [place] What should I tell my health care provider before I take this medicine? They need to know if you have any of these conditions:  diabetes  gallbladder disease  heart disease  kidney disease  liver disease  thyroid disease  an unusual or allergic reaction to lanreotide, other medicines, foods, dyes, or preservatives  pregnant or trying to get pregnant  breast-feeding How should I use this medicine? This medicine is for injection under the skin. It is given by a health care professional in a hospital or clinic setting. Contact your pediatrician or health care professional regarding the use of this medicine in children. Special care may be needed. Overdosage: If you think you have taken too much of this medicine contact a poison control center or emergency room at once. NOTE: This medicine is only for you. Do not share this medicine with others. What if I miss a dose? It is important not to miss your dose. Call your doctor or health care professional if you are unable to keep an appointment. What may interact with this medicine? This medicine may interact with the following medications:  bromocriptine  cyclosporine  certain medicines for blood pressure, heart disease, irregular heart beat  certain medicines for diabetes  quinidine  terfenadine This list may not describe all possible interactions. Give your health care provider a list of all the medicines, herbs, non-prescription drugs, or dietary supplements you use. Also tell them if you smoke,  drink alcohol, or use illegal drugs. Some items may interact with your medicine. What should I watch for while using this medicine? Tell your doctor or healthcare professional if your symptoms do not start to get better or if they get worse. Visit your doctor or health care professional for regular checks on your progress. Your condition will be monitored carefully while you are receiving this medicine. This medicine may increase blood sugar. Ask your healthcare provider if changes in diet or medicines are needed if you have diabetes. You may need blood work done while you are taking this medicine. Women should inform their doctor if they wish to become pregnant or think they might be pregnant. There is a potential for serious side effects to an unborn child. Talk to your health care professional or pharmacist for more information. Do not breast-feed an infant while taking this medicine or for 6 months after stopping it. This medicine has caused ovarian failure in some women. This medicine may interfere with the ability to have a child. Talk with your doctor or health care professional if you are concerned about your fertility. What side effects may I notice from receiving this medicine? Side effects that you should report to your doctor or health care professional as soon as possible:  allergic reactions like skin rash, itching or hives, swelling of the face, lips, or tongue  increased blood pressure  severe stomach pain  signs and symptoms of hgh blood sugar such as being more thirsty or hungry or having to urinate more than normal. You may also feel very tired or have blurry vision.  signs and symptoms of low blood   sugar such as feeling anxious; confusion; dizziness; increased hunger; unusually weak or tired; sweating; shakiness; cold; irritable; headache; blurred vision; fast heartbeat; loss of consciousness  unusually slow heartbeat Side effects that usually do not require medical  attention (report to your doctor or health care professional if they continue or are bothersome):  constipation  diarrhea  dizziness  headache  muscle pain  muscle spasms  nausea  pain, redness, or irritation at site where injected This list may not describe all possible side effects. Call your doctor for medical advice about side effects. You may report side effects to FDA at 1-800-FDA-1088. Where should I keep my medicine? This drug is given in a hospital or clinic and will not be stored at home. NOTE: This sheet is a summary. It may not cover all possible information. If you have questions about this medicine, talk to your doctor, pharmacist, or health care provider.  2020 Elsevier/Gold Standard (2017-10-06 09:13:08)  

## 2018-11-03 ENCOUNTER — Telehealth: Payer: Self-pay | Admitting: *Deleted

## 2018-11-03 NOTE — Telephone Encounter (Signed)
Called to report she tested positive for COVID on 11/02/18 at work. Had mild headache x 2 days and body aches. Staying at home for 14 days. Asking if she can still come to appointment on 11/23/18. Assured her she is OK to come since it is past the 14 days.

## 2018-11-10 ENCOUNTER — Other Ambulatory Visit: Payer: Self-pay | Admitting: General Surgery

## 2018-11-18 ENCOUNTER — Other Ambulatory Visit: Payer: Self-pay

## 2018-11-18 ENCOUNTER — Ambulatory Visit
Admission: RE | Admit: 2018-11-18 | Discharge: 2018-11-18 | Disposition: A | Discharge status: Home / Self Care | Payer: No Typology Code available for payment source | Source: Ambulatory Visit | Attending: General Surgery | Admitting: General Surgery

## 2018-11-18 DIAGNOSIS — D0501 Lobular carcinoma in situ of right breast: Secondary | ICD-10-CM

## 2018-11-18 MED ORDER — GADOBUTROL 1 MMOL/ML IV SOLN
8.0000 mL | Freq: Once | INTRAVENOUS | Status: AC | PRN
  Administered 2018-11-18: 8 mL via INTRAVENOUS

## 2018-11-23 ENCOUNTER — Other Ambulatory Visit: Payer: Self-pay

## 2018-11-23 ENCOUNTER — Inpatient Hospital Stay: Payer: No Typology Code available for payment source

## 2018-11-23 ENCOUNTER — Encounter: Payer: Self-pay | Admitting: Nurse Practitioner

## 2018-11-23 ENCOUNTER — Inpatient Hospital Stay
Payer: No Typology Code available for payment source | Attending: Hematology and Oncology | Admitting: Nurse Practitioner

## 2018-11-23 VITALS — BP 100/69 | HR 59 | Temp 98.2°F | Resp 17 | Ht 62.0 in | Wt 170.0 lb

## 2018-11-23 DIAGNOSIS — C7A8 Other malignant neuroendocrine tumors: Secondary | ICD-10-CM | POA: Diagnosis not present

## 2018-11-23 DIAGNOSIS — D0501 Lobular carcinoma in situ of right breast: Secondary | ICD-10-CM | POA: Diagnosis not present

## 2018-11-23 DIAGNOSIS — Z79899 Other long term (current) drug therapy: Secondary | ICD-10-CM | POA: Diagnosis not present

## 2018-11-23 DIAGNOSIS — E7800 Pure hypercholesterolemia, unspecified: Secondary | ICD-10-CM | POA: Insufficient documentation

## 2018-11-23 DIAGNOSIS — C7A00 Malignant carcinoid tumor of unspecified site: Secondary | ICD-10-CM | POA: Diagnosis present

## 2018-11-23 DIAGNOSIS — R109 Unspecified abdominal pain: Secondary | ICD-10-CM | POA: Insufficient documentation

## 2018-11-23 DIAGNOSIS — C7B8 Other secondary neuroendocrine tumors: Secondary | ICD-10-CM

## 2018-11-23 DIAGNOSIS — R232 Flushing: Secondary | ICD-10-CM | POA: Insufficient documentation

## 2018-11-23 DIAGNOSIS — Z79810 Long term (current) use of selective estrogen receptor modulators (SERMs): Secondary | ICD-10-CM | POA: Diagnosis not present

## 2018-11-23 LAB — CMP (CANCER CENTER ONLY)
ALT: 19 U/L (ref 0–44)
AST: 16 U/L (ref 15–41)
Albumin: 3.9 g/dL (ref 3.5–5.0)
Alkaline Phosphatase: 58 U/L (ref 38–126)
Anion gap: 9 (ref 5–15)
BUN: 8 mg/dL (ref 6–20)
CO2: 26 mmol/L (ref 22–32)
Calcium: 8.8 mg/dL — ABNORMAL LOW (ref 8.9–10.3)
Chloride: 108 mmol/L (ref 98–111)
Creatinine: 0.74 mg/dL (ref 0.44–1.00)
GFR, Est AFR Am: >60 mL/min (ref >60)
GFR, Estimated: >60 mL/min (ref >60)
Glucose, Bld: 116 mg/dL — ABNORMAL HIGH (ref 70–99)
Potassium: 3.9 mmol/L (ref 3.5–5.1)
Sodium: 143 mmol/L (ref 135–145)
Total Bilirubin: 0.5 mg/dL (ref 0.3–1.2)
Total Protein: 6.9 g/dL (ref 6.5–8.1)

## 2018-11-23 MED ORDER — LANREOTIDE ACETATE 120 MG/0.5ML ~~LOC~~ SOLN
120.0000 mg | Freq: Once | SUBCUTANEOUS | Status: AC
  Administered 2018-11-23: 120 mg via SUBCUTANEOUS
  Filled 2018-11-23: qty 120

## 2018-11-23 NOTE — Progress Notes (Signed)
  [place] OFFICE PROGRESS NOTE   Diagnosis: Metastatic neuroendocrine tumor  INTERVAL HISTORY:   [name] returns as scheduled.  She continues monthly lanreotide.  She notes abdominal cramps and diarrhea for a few days after each injection.  She continues tamoxifen.  She has periodic hot flashes.  No symptoms of thrombosis.  She has occasional left-sided abdominal pain.  Objective:  Vital signs in last 24 hours:  Blood pressure 100/69, pulse (!) 59, temperature 98.2 F (36.8 C), temperature source Temporal, resp. rate 17, height 5\' 2"  (1.575 m), weight 170 lb (77.1 kg), last menstrual period 05/17/2017, SpO2 97 %.   Limited physical examination secondary to distancing with the Covid pandemic GI: Abdomen soft and nontender.  No hepatomegaly.  No mass. Vascular: No leg edema.  Calves soft and nontender.    Lab Results:  Lab Results  Component Value Date   WBC 9.0 06/21/2018   HGB 13.0 06/21/2018   HCT 38.9 06/21/2018   MCV 85.5 06/21/2018   PLT 317 06/21/2018   NEUTROABS 5.6 11/24/2012    Imaging:  No results found.  Medications: I have reviewed the patient's current medications.  Assessment/Plan: 1. Metastatic carcinoid involving liver  05/31/2018 breast MRI-numerous T2 lesions throughout the liver.   06/07/2018 MRI of the liver-numerous hepatic lesions scattered throughout all aspects of the liver.  06/14/2018 PET scan-enlarged and hypermetabolic perihepatic and portacaval lymph nodes; diffuse heterogeneous uptake within the liver with subtle areas of mildly increased FDG uptake corresponding to suspicious lesions identified on MRI.   06/21/2018 biopsy of a right liver lobe mass-low-grade neuroendocrine tumor consistent with metastasis, positive CD56, chromogranin, synaptophysin, CDX-2 and cytokeratin AE1/AE3.   07/07/2018 Netspot- well-differentiated neuroendocrine tumor with metastasis to multiple periportal and peripancreatic lymph nodes,  multiple small bilobar hepatic metastasis, solitary middle mediastinal nodal metastasis.  Single focus of uptake in the proximal right humerus.  No clear primary tumor identified.  Normal chromogranin A 07/05/2018 and normal 24-hour urine 5 HIAA 08/03/2018  Monthly lanreotide beginning 08/03/2018 2. Lobular carcinoma in situ right breast, on tamoxifen, followed by Dr. Lindi Adie 3. Hypercholesterolemia  Disposition: [name] appears stable.  She continues monthly lanreotide.  We will follow-up on the chromogranin A level from today.  She will undergo a restaging CT abdomen/pelvis in approximately 1 month.  She continues follow-up with Dr. Lindi Adie for management of LCIS.    She will return for a follow-up visit 12/21/2018.  She will contact the office in the interim with any problems.  Plan reviewed with Dr. Benay Spice.    Ned Card ANP/GNP-BC   11/23/2018  10:02 AM

## 2018-11-24 ENCOUNTER — Telehealth: Payer: Self-pay | Admitting: Oncology

## 2018-11-24 LAB — CHROMOGRANIN A: Chromogranin A (ng/mL): 35.8 ng/mL (ref 0.0–101.8)

## 2018-11-24 NOTE — Telephone Encounter (Signed)
Scheduled per los. Called and left msg. Mailed printout  °

## 2018-12-15 ENCOUNTER — Ambulatory Visit
Admission: RE | Admit: 2018-12-15 | Discharge: 2018-12-15 | Disposition: A | Discharge status: Home / Self Care | Payer: No Typology Code available for payment source | Source: Ambulatory Visit | Attending: Nurse Practitioner | Admitting: Nurse Practitioner

## 2018-12-15 DIAGNOSIS — C7A8 Other malignant neuroendocrine tumors: Secondary | ICD-10-CM

## 2018-12-15 MED ORDER — IOPAMIDOL (ISOVUE-300) INJECTION 61%
100.0000 mL | Freq: Once | INTRAVENOUS | Status: AC | PRN
  Administered 2018-12-15: 100 mL via INTRAVENOUS

## 2018-12-21 ENCOUNTER — Inpatient Hospital Stay: Payer: No Typology Code available for payment source

## 2018-12-21 ENCOUNTER — Other Ambulatory Visit: Payer: Self-pay

## 2018-12-21 ENCOUNTER — Inpatient Hospital Stay: Payer: No Typology Code available for payment source | Attending: Hematology and Oncology | Admitting: Oncology

## 2018-12-21 VITALS — BP 118/61 | HR 67 | Temp 98.0°F | Resp 16 | Ht 62.0 in | Wt 169.3 lb

## 2018-12-21 DIAGNOSIS — C7B8 Other secondary neuroendocrine tumors: Secondary | ICD-10-CM

## 2018-12-21 DIAGNOSIS — Z79810 Long term (current) use of selective estrogen receptor modulators (SERMs): Secondary | ICD-10-CM | POA: Insufficient documentation

## 2018-12-21 DIAGNOSIS — C7A00 Malignant carcinoid tumor of unspecified site: Secondary | ICD-10-CM | POA: Insufficient documentation

## 2018-12-21 DIAGNOSIS — Z79899 Other long term (current) drug therapy: Secondary | ICD-10-CM | POA: Insufficient documentation

## 2018-12-21 DIAGNOSIS — E7800 Pure hypercholesterolemia, unspecified: Secondary | ICD-10-CM | POA: Insufficient documentation

## 2018-12-21 DIAGNOSIS — C787 Secondary malignant neoplasm of liver and intrahepatic bile duct: Secondary | ICD-10-CM | POA: Diagnosis not present

## 2018-12-21 DIAGNOSIS — C771 Secondary and unspecified malignant neoplasm of intrathoracic lymph nodes: Secondary | ICD-10-CM | POA: Insufficient documentation

## 2018-12-21 DIAGNOSIS — C7A8 Other malignant neuroendocrine tumors: Secondary | ICD-10-CM

## 2018-12-21 MED ORDER — LANREOTIDE ACETATE 120 MG/0.5ML ~~LOC~~ SOLN
120.0000 mg | Freq: Once | SUBCUTANEOUS | Status: AC
  Administered 2018-12-21: 120 mg via SUBCUTANEOUS
  Filled 2018-12-21: qty 120

## 2018-12-21 NOTE — Patient Instructions (Signed)
Lanreotide injection What is this medicine? LANREOTIDE (lan REE oh tide) is used to reduce blood levels of growth hormone in patients with a condition called acromegaly. It also works to slow or stop tumor growth in patients with neuroendocrine tumors and treat carcinoid syndrome. This medicine may be used for other purposes; ask your health care provider or pharmacist if you have questions. COMMON BRAND NAME(S): Somatuline [place] What should I tell my health care provider before I take this medicine? They need to know if you have any of these conditions:  diabetes  gallbladder disease  heart disease  kidney disease  liver disease  thyroid disease  an unusual or allergic reaction to lanreotide, other medicines, foods, dyes, or preservatives  pregnant or trying to get pregnant  breast-feeding How should I use this medicine? This medicine is for injection under the skin. It is given by a health care professional in a hospital or clinic setting. Contact your pediatrician or health care professional regarding the use of this medicine in children. Special care may be needed. Overdosage: If you think you have taken too much of this medicine contact a poison control center or emergency room at once. NOTE: This medicine is only for you. Do not share this medicine with others. What if I miss a dose? It is important not to miss your dose. Call your doctor or health care professional if you are unable to keep an appointment. What may interact with this medicine? This medicine may interact with the following medications:  bromocriptine  cyclosporine  certain medicines for blood pressure, heart disease, irregular heart beat  certain medicines for diabetes  quinidine  terfenadine This list may not describe all possible interactions. Give your health care provider a list of all the medicines, herbs, non-prescription drugs, or dietary supplements you use. Also tell them if you smoke,  drink alcohol, or use illegal drugs. Some items may interact with your medicine. What should I watch for while using this medicine? Tell your doctor or healthcare professional if your symptoms do not start to get better or if they get worse. Visit your doctor or health care professional for regular checks on your progress. Your condition will be monitored carefully while you are receiving this medicine. This medicine may increase blood sugar. Ask your healthcare provider if changes in diet or medicines are needed if you have diabetes. You may need blood work done while you are taking this medicine. Women should inform their doctor if they wish to become pregnant or think they might be pregnant. There is a potential for serious side effects to an unborn child. Talk to your health care professional or pharmacist for more information. Do not breast-feed an infant while taking this medicine or for 6 months after stopping it. This medicine has caused ovarian failure in some women. This medicine may interfere with the ability to have a child. Talk with your doctor or health care professional if you are concerned about your fertility. What side effects may I notice from receiving this medicine? Side effects that you should report to your doctor or health care professional as soon as possible:  allergic reactions like skin rash, itching or hives, swelling of the face, lips, or tongue  increased blood pressure  severe stomach pain  signs and symptoms of hgh blood sugar such as being more thirsty or hungry or having to urinate more than normal. You may also feel very tired or have blurry vision.  signs and symptoms of low blood   sugar such as feeling anxious; confusion; dizziness; increased hunger; unusually weak or tired; sweating; shakiness; cold; irritable; headache; blurred vision; fast heartbeat; loss of consciousness  unusually slow heartbeat Side effects that usually do not require medical  attention (report to your doctor or health care professional if they continue or are bothersome):  constipation  diarrhea  dizziness  headache  muscle pain  muscle spasms  nausea  pain, redness, or irritation at site where injected This list may not describe all possible side effects. Call your doctor for medical advice about side effects. You may report side effects to FDA at 1-800-FDA-1088. Where should I keep my medicine? This drug is given in a hospital or clinic and will not be stored at home. NOTE: This sheet is a summary. It may not cover all possible information. If you have questions about this medicine, talk to your doctor, pharmacist, or health care provider.  2020 Elsevier/Gold Standard (2017-10-06 09:13:08)  

## 2018-12-21 NOTE — Progress Notes (Signed)
  Sally Brown OFFICE PROGRESS NOTE   Diagnosis: Carcinoid tumor  INTERVAL HISTORY:   Sally Brown continues monthly lanreotide.  She reports tolerating the treatment well.  She has diarrhea for a few days following the injection.  No fever or flushing.  No other diarrhea.  Good appetite and energy level.  Objective:  Vital signs in last 24 hours:  Blood pressure 118/61, pulse 67, temperature 98 F (36.7 C), temperature source Temporal, resp. rate 16, height 5\' 2"  (1.575 m), weight 169 lb 4.8 oz (76.8 kg), last menstrual period 05/17/2017, SpO2 98 %.    Limited physical examination secondary to distancing with the Covid pandemic GI: No hepatomegaly, no mass, nontender Vascular: No leg edema    Lab Results:  Lab Results  Component Value Date   WBC 9.0 06/21/2018   HGB 13.0 06/21/2018   HCT 38.9 06/21/2018   MCV 85.5 06/21/2018   PLT 317 06/21/2018   NEUTROABS 5.6 11/24/2012    CMP  Lab Results  Component Value Date   NA 143 11/23/2018   K 3.9 11/23/2018   CL 108 11/23/2018   CO2 26 11/23/2018   GLUCOSE 116 (H) 11/23/2018   BUN 8 11/23/2018   CREATININE 0.74 11/23/2018   CALCIUM 8.8 (L) 11/23/2018   PROT 6.9 11/23/2018   ALBUMIN 3.9 11/23/2018   AST 16 11/23/2018   ALT 19 11/23/2018   ALKPHOS 58 11/23/2018   BILITOT 0.5 11/23/2018   GFRNONAA >60 11/23/2018   GFRAA >60 11/23/2018   Chromogranin A on 11/23/2018: 35.8  Medications: I have reviewed the patient's current medications.   Assessment/Plan: 1. Metastatic carcinoid involving liver  05/31/2018 breast MRI-numerous T2 lesions throughout the liver.   06/07/2018 MRI of the liver-numerous hepatic lesions scattered throughout all aspects of the liver.  06/14/2018 PET scan-enlarged and hypermetabolic perihepatic and portacaval lymph nodes; diffuse heterogeneous uptake within the liver with subtle areas of mildly increased FDG uptake corresponding to suspicious lesions identified on MRI.    06/21/2018 biopsy of a right liver lobe mass-low-grade neuroendocrine tumor consistent with metastasis, positive CD56, chromogranin, synaptophysin, CDX-2 and cytokeratin AE1/AE3.   07/07/2018 Netspot- well-differentiated neuroendocrine tumor with metastasis to multiple periportal and peripancreatic lymph nodes, multiple small bilobar hepatic metastasis, solitary middle mediastinal nodal metastasis.  Single focus of uptake in the proximal right humerus.  No clear primary tumor identified.  Normal chromogranin A 07/05/2018 and normal 24-hour urine 5 HIAA 08/03/2018  Monthly lanreotide beginning 08/03/2018  CT 12/15/2018-small hepatic metastases, mildly improved, slight increase in size of a porta hepatis node 2. Lobular carcinoma in situ right breast, on tamoxifen, followed by Dr. Lindi Adie 3. Hypercholesterolemia    Disposition: Sally Brown appears stable.  The restaging CT reveals no evidence of progressive disease in the liver.  The plan is to continue monthly lanreotide.  She will return for an office visit and chromogranin a level in 4 months.  Sally Coder, MD  12/21/2018  11:40 AM

## 2018-12-22 ENCOUNTER — Telehealth: Payer: Self-pay | Admitting: Oncology

## 2018-12-22 NOTE — Telephone Encounter (Signed)
Scheduled per los. Called and spoke with patient and confirmed appts

## 2019-01-25 ENCOUNTER — Inpatient Hospital Stay: Payer: No Typology Code available for payment source | Attending: Nurse Practitioner

## 2019-01-25 ENCOUNTER — Other Ambulatory Visit: Payer: Self-pay | Admitting: Oncology

## 2019-01-25 ENCOUNTER — Other Ambulatory Visit: Payer: Self-pay

## 2019-01-25 VITALS — BP 122/68 | HR 62 | Temp 98.2°F | Resp 18

## 2019-01-25 DIAGNOSIS — C7A8 Other malignant neuroendocrine tumors: Secondary | ICD-10-CM | POA: Insufficient documentation

## 2019-01-25 DIAGNOSIS — Z79899 Other long term (current) drug therapy: Secondary | ICD-10-CM | POA: Diagnosis not present

## 2019-01-25 DIAGNOSIS — C7B8 Other secondary neuroendocrine tumors: Secondary | ICD-10-CM | POA: Insufficient documentation

## 2019-01-25 MED ORDER — LANREOTIDE ACETATE 120 MG/0.5ML ~~LOC~~ SOLN
120.0000 mg | Freq: Once | SUBCUTANEOUS | Status: AC
  Administered 2019-01-25: 11:00:00 120 mg via SUBCUTANEOUS
  Filled 2019-01-25: qty 120

## 2019-01-25 NOTE — Patient Instructions (Signed)
Lanreotide injection What is this medicine? LANREOTIDE (lan REE oh tide) is used to reduce blood levels of growth hormone in patients with a condition called acromegaly. It also works to slow or stop tumor growth in patients with neuroendocrine tumors and treat carcinoid syndrome. This medicine may be used for other purposes; ask your health care provider or pharmacist if you have questions. COMMON BRAND NAME(S): Somatuline [place] What should I tell my health care provider before I take this medicine? They need to know if you have any of these conditions:  diabetes  gallbladder disease  heart disease  kidney disease  liver disease  thyroid disease  an unusual or allergic reaction to lanreotide, other medicines, foods, dyes, or preservatives  pregnant or trying to get pregnant  breast-feeding How should I use this medicine? This medicine is for injection under the skin. It is given by a health care professional in a hospital or clinic setting. Contact your pediatrician or health care professional regarding the use of this medicine in children. Special care may be needed. Overdosage: If you think you have taken too much of this medicine contact a poison control center or emergency room at once. NOTE: This medicine is only for you. Do not share this medicine with others. What if I miss a dose? It is important not to miss your dose. Call your doctor or health care professional if you are unable to keep an appointment. What may interact with this medicine? This medicine may interact with the following medications:  bromocriptine  cyclosporine  certain medicines for blood pressure, heart disease, irregular heart beat  certain medicines for diabetes  quinidine  terfenadine This list may not describe all possible interactions. Give your health care provider a list of all the medicines, herbs, non-prescription drugs, or dietary supplements you use. Also tell them if you smoke,  drink alcohol, or use illegal drugs. Some items may interact with your medicine. What should I watch for while using this medicine? Tell your doctor or healthcare professional if your symptoms do not start to get better or if they get worse. Visit your doctor or health care professional for regular checks on your progress. Your condition will be monitored carefully while you are receiving this medicine. This medicine may increase blood sugar. Ask your healthcare provider if changes in diet or medicines are needed if you have diabetes. You may need blood work done while you are taking this medicine. Women should inform their doctor if they wish to become pregnant or think they might be pregnant. There is a potential for serious side effects to an unborn child. Talk to your health care professional or pharmacist for more information. Do not breast-feed an infant while taking this medicine or for 6 months after stopping it. This medicine has caused ovarian failure in some women. This medicine may interfere with the ability to have a child. Talk with your doctor or health care professional if you are concerned about your fertility. What side effects may I notice from receiving this medicine? Side effects that you should report to your doctor or health care professional as soon as possible:  allergic reactions like skin rash, itching or hives, swelling of the face, lips, or tongue  increased blood pressure  severe stomach pain  signs and symptoms of hgh blood sugar such as being more thirsty or hungry or having to urinate more than normal. You may also feel very tired or have blurry vision.  signs and symptoms of low blood   sugar such as feeling anxious; confusion; dizziness; increased hunger; unusually weak or tired; sweating; shakiness; cold; irritable; headache; blurred vision; fast heartbeat; loss of consciousness  unusually slow heartbeat Side effects that usually do not require medical  attention (report to your doctor or health care professional if they continue or are bothersome):  constipation  diarrhea  dizziness  headache  muscle pain  muscle spasms  nausea  pain, redness, or irritation at site where injected This list may not describe all possible side effects. Call your doctor for medical advice about side effects. You may report side effects to FDA at 1-800-FDA-1088. Where should I keep my medicine? This drug is given in a hospital or clinic and will not be stored at home. NOTE: This sheet is a summary. It may not cover all possible information. If you have questions about this medicine, talk to your doctor, pharmacist, or health care provider.  2020 Elsevier/Gold Standard (2017-10-06 09:13:08)  

## 2019-01-29 ENCOUNTER — Telehealth: Payer: Self-pay | Admitting: Hematology and Oncology

## 2019-01-29 NOTE — Telephone Encounter (Signed)
Scheduled appt per 11/5  sch message - pt aware of appt date and time   

## 2019-02-02 ENCOUNTER — Telehealth: Payer: Self-pay | Admitting: Emergency Medicine

## 2019-02-02 NOTE — Telephone Encounter (Signed)
Pt called asking if was ok to receive the Covid-19 vaccine while on tx, ok per MD Sherrill to receive vaccine.  Pt verbalized understanding.

## 2019-02-12 NOTE — Progress Notes (Signed)
Patient Care Team: Mayra Neer, MD as PCP - General (Family Medicine)  DIAGNOSIS:    ICD-10-CM   1. Lobular carcinoma in situ (LCIS) of right breast  D05.01     SUMMARY OF ONCOLOGIC HISTORY: Oncology History  Lobular carcinoma in situ (LCIS) of right breast  05/29/2018 Initial Diagnosis   Screening mammogram detected right breast asymmetry, biopsy right breast UOQ: LCIS, breast MRI: Area of LCIS 2 cm.  New indeterminate non-mass enhancement lower outer quadrant right breast 5.3 cm, T2 lesions throughout the liver    06/06/2018 -  Anti-estrogen oral therapy   Tamoxifen 37m daily   06/13/2018 Genetic Testing   No Pathogenic variants identified. APC c.4709A>G VUS found.  The Common Hereditary Gene Panel offered by [facility] includes sequencing and/or deletion duplication testing of the following 48 genes: APC, ATM, AXIN2, BARD1, BMPR1A, BRCA1, BRCA2, BRIP1, CDH1, CDK4, CDKN2A (p14ARF), CDKN2A (p16INK4a), CHEK2, CTNNA1, DICER1, EPCAM (Deletion/duplication testing only), GREM1 (promoter region deletion/duplication testing only), KIT, MEN1, MLH1, MSH2, MSH3, MSH6, MUTYH, NBN, NF1, NHTL1, PALB2, PDGFRA, PMS2, POLD1, POLE, PTEN, RAD50, RAD51C, RAD51D, RNF43, SDHB, SDHC, SDHD, SMAD4, SMARCA4. STK11, TP53, TSC1, TSC2, and VHL.  The following genes were evaluated for sequence changes only: SDHA and HOXB13 c.251G>A variant only. The report date is June 13, 2018.     CHIEF COMPLIANT: Follow-up of right breast cancer on tamoxifen  INTERVAL HISTORY: Sally SAWTELLEis a 54y.o. with above-mentioned history of right breast cancer currently on tamoxifen. She also has a history of metastatic carcinoid involving the liver, followed by Dr. SBenay Spice Breast MRI on 11/18/18 showed no evidence of malignancy bilaterally. She presents to the clinic today for follow-up.  She has intermittent abdominal cramps and diarrhea.  Denies any major problems with tamoxifen.  She has occasional hot flashes.  ALLERGIES:   is allergic to codeine and sulfa antibiotics.  MEDICATIONS:  Current Outpatient Medications  Medication Sig Dispense Refill  . rosuvastatin (CRESTOR) 5 MG tablet Take 5 mg by mouth daily.    . tamoxifen (NOLVADEX) 20 MG tablet Take 1 tablet (20 mg total) by mouth daily. 90 tablet 3   No current facility-administered medications for this visit.    PHYSICAL EXAMINATION: ECOG PERFORMANCE STATUS: 1 - Symptomatic but completely ambulatory  Vitals:   02/13/19 1212  BP: (!) 103/49  Pulse: (!) 59  Resp: 20  Temp: 98.1 F (36.7 C)  SpO2: 98%   Filed Weights   02/13/19 1212  Weight: 169 lb 6.4 oz (76.8 kg)    LABORATORY DATA:  I have reviewed the data as listed CMP Latest Ref Rng & Units 11/23/2018 07/05/2018 11/24/2012  Glucose 70 - 99 mg/dL 116(H) 90 112(H)  BUN 6 - 20 mg/dL 8 11 17   Creatinine 0.44 - 1.00 mg/dL 0.74 0.77 0.69  Sodium 135 - 145 mmol/L 143 142 137  Potassium 3.5 - 5.1 mmol/L 3.9 4.3 4.0  Chloride 98 - 111 mmol/L 108 106 103  CO2 22 - 32 mmol/L 26 27 24   Calcium 8.9 - 10.3 mg/dL 8.8(L) 9.0 9.6  Total Protein 6.5 - 8.1 g/dL 6.9 7.2 7.0  Total Bilirubin 0.3 - 1.2 mg/dL 0.5 0.4 0.2(L)  Alkaline Phos 38 - 126 U/L 58 64 63  AST 15 - 41 U/L 16 19 20   ALT 0 - 44 U/L 19 21 26     Lab Results  Component Value Date   WBC 9.0 06/21/2018   HGB 13.0 06/21/2018   HCT 38.9 06/21/2018   MCV 85.5  06/21/2018   PLT 317 06/21/2018   NEUTROABS 5.6 11/24/2012    ASSESSMENT & PLAN:  Lobular carcinoma in situ (LCIS) of right breast Current treatment: Tamoxifen for breast reduction 20 mg daily Tamoxifen toxicities: Occasional hot flashes and mood swings  Breast cancer surveillance: Breast MRI November 2020: Benign Mammograms will be ordered for June 2021.     Low-grade metastatic carcinoid tumor: Under the care of Dr. Benay Spice. I recommended that the patient can follow-up with Dr. Benay Spice for her breast cancer surveillance as well. I ordered her MRI for next  year.   No orders of the defined types were placed in this encounter.  The patient has a good understanding of the overall plan. she agrees with it. she will call with any problems that may develop before the next visit here.  Total time spent: 20 mins including face to face time and time spent for planning, charting and coordination of care  Nicholas Lose, MD 02/13/2019  I, Cloyde Reams Dorshimer, am acting as scribe for Dr. Nicholas Lose.  I have reviewed the above documentation for accuracy and completeness, and I agree with the above.

## 2019-02-13 ENCOUNTER — Ambulatory Visit: Payer: No Typology Code available for payment source | Admitting: Hematology and Oncology

## 2019-02-13 ENCOUNTER — Other Ambulatory Visit: Payer: Self-pay

## 2019-02-13 ENCOUNTER — Inpatient Hospital Stay
Payer: No Typology Code available for payment source | Attending: Hematology and Oncology | Admitting: Hematology and Oncology

## 2019-02-13 DIAGNOSIS — D0501 Lobular carcinoma in situ of right breast: Secondary | ICD-10-CM | POA: Diagnosis not present

## 2019-02-13 DIAGNOSIS — C7A00 Malignant carcinoid tumor of unspecified site: Secondary | ICD-10-CM | POA: Diagnosis not present

## 2019-02-13 DIAGNOSIS — Z79899 Other long term (current) drug therapy: Secondary | ICD-10-CM | POA: Diagnosis not present

## 2019-02-13 DIAGNOSIS — Z79810 Long term (current) use of selective estrogen receptor modulators (SERMs): Secondary | ICD-10-CM | POA: Insufficient documentation

## 2019-02-13 DIAGNOSIS — R232 Flushing: Secondary | ICD-10-CM | POA: Insufficient documentation

## 2019-02-13 DIAGNOSIS — Z170 Estrogen receptor positive status [ER+]: Secondary | ICD-10-CM | POA: Insufficient documentation

## 2019-02-13 NOTE — Assessment & Plan Note (Signed)
Current treatment: Tamoxifen for breast reduction 20 mg daily Tamoxifen toxicities: Occasional hot flashes and mood swings  Breast cancer surveillance: Breast MRI November 2020: Benign Mammograms will be ordered for June 2021.     Low-grade metastatic carcinoid tumor: Under the care of Dr. Benay Spice. I recommended that the patient can follow-up with Dr. Benay Spice for her breast cancer surveillance as well.

## 2019-02-22 ENCOUNTER — Inpatient Hospital Stay: Payer: No Typology Code available for payment source

## 2019-02-22 ENCOUNTER — Other Ambulatory Visit: Payer: Self-pay

## 2019-02-22 VITALS — BP 127/81 | HR 55 | Temp 97.5°F | Resp 18

## 2019-02-22 DIAGNOSIS — C7B8 Other secondary neuroendocrine tumors: Secondary | ICD-10-CM

## 2019-02-22 DIAGNOSIS — C7A8 Other malignant neuroendocrine tumors: Secondary | ICD-10-CM

## 2019-02-22 DIAGNOSIS — D0501 Lobular carcinoma in situ of right breast: Secondary | ICD-10-CM | POA: Diagnosis not present

## 2019-02-22 MED ORDER — LANREOTIDE ACETATE 120 MG/0.5ML ~~LOC~~ SOLN
120.0000 mg | Freq: Once | SUBCUTANEOUS | Status: AC
  Administered 2019-02-22: 120 mg via SUBCUTANEOUS
  Filled 2019-02-22: qty 120

## 2019-02-22 NOTE — Patient Instructions (Signed)
Lanreotide injection What is this medicine? LANREOTIDE (lan REE oh tide) is used to reduce blood levels of growth hormone in patients with a condition called acromegaly. It also works to slow or stop tumor growth in patients with neuroendocrine tumors and treat carcinoid syndrome. This medicine may be used for other purposes; ask your health care provider or pharmacist if you have questions. COMMON BRAND NAME(S): Somatuline [place] What should I tell my health care provider before I take this medicine? They need to know if you have any of these conditions:  diabetes  gallbladder disease  heart disease  kidney disease  liver disease  thyroid disease  an unusual or allergic reaction to lanreotide, other medicines, foods, dyes, or preservatives  pregnant or trying to get pregnant  breast-feeding How should I use this medicine? This medicine is for injection under the skin. It is given by a health care professional in a hospital or clinic setting. Contact your pediatrician or health care professional regarding the use of this medicine in children. Special care may be needed. Overdosage: If you think you have taken too much of this medicine contact a poison control center or emergency room at once. NOTE: This medicine is only for you. Do not share this medicine with others. What if I miss a dose? It is important not to miss your dose. Call your doctor or health care professional if you are unable to keep an appointment. What may interact with this medicine? This medicine may interact with the following medications:  bromocriptine  cyclosporine  certain medicines for blood pressure, heart disease, irregular heart beat  certain medicines for diabetes  quinidine  terfenadine This list may not describe all possible interactions. Give your health care provider a list of all the medicines, herbs, non-prescription drugs, or dietary supplements you use. Also tell them if you smoke,  drink alcohol, or use illegal drugs. Some items may interact with your medicine. What should I watch for while using this medicine? Tell your doctor or healthcare professional if your symptoms do not start to get better or if they get worse. Visit your doctor or health care professional for regular checks on your progress. Your condition will be monitored carefully while you are receiving this medicine. This medicine may increase blood sugar. Ask your healthcare provider if changes in diet or medicines are needed if you have diabetes. You may need blood work done while you are taking this medicine. Women should inform their doctor if they wish to become pregnant or think they might be pregnant. There is a potential for serious side effects to an unborn child. Talk to your health care professional or pharmacist for more information. Do not breast-feed an infant while taking this medicine or for 6 months after stopping it. This medicine has caused ovarian failure in some women. This medicine may interfere with the ability to have a child. Talk with your doctor or health care professional if you are concerned about your fertility. What side effects may I notice from receiving this medicine? Side effects that you should report to your doctor or health care professional as soon as possible:  allergic reactions like skin rash, itching or hives, swelling of the face, lips, or tongue  increased blood pressure  severe stomach pain  signs and symptoms of hgh blood sugar such as being more thirsty or hungry or having to urinate more than normal. You may also feel very tired or have blurry vision.  signs and symptoms of low blood   sugar such as feeling anxious; confusion; dizziness; increased hunger; unusually weak or tired; sweating; shakiness; cold; irritable; headache; blurred vision; fast heartbeat; loss of consciousness  unusually slow heartbeat Side effects that usually do not require medical  attention (report to your doctor or health care professional if they continue or are bothersome):  constipation  diarrhea  dizziness  headache  muscle pain  muscle spasms  nausea  pain, redness, or irritation at site where injected This list may not describe all possible side effects. Call your doctor for medical advice about side effects. You may report side effects to FDA at 1-800-FDA-1088. Where should I keep my medicine? This drug is given in a hospital or clinic and will not be stored at home. NOTE: This sheet is a summary. It may not cover all possible information. If you have questions about this medicine, talk to your doctor, pharmacist, or health care provider.  2020 Elsevier/Gold Standard (2017-10-06 09:13:08)  

## 2019-03-21 ENCOUNTER — Other Ambulatory Visit: Payer: Self-pay | Admitting: Family Medicine

## 2019-03-21 DIAGNOSIS — Z1231 Encounter for screening mammogram for malignant neoplasm of breast: Secondary | ICD-10-CM

## 2019-03-22 ENCOUNTER — Inpatient Hospital Stay: Payer: No Typology Code available for payment source | Attending: Nurse Practitioner

## 2019-03-22 ENCOUNTER — Other Ambulatory Visit: Payer: Self-pay

## 2019-03-22 VITALS — BP 124/73 | HR 59 | Temp 98.0°F | Resp 16

## 2019-03-22 DIAGNOSIS — C7A8 Other malignant neuroendocrine tumors: Secondary | ICD-10-CM

## 2019-03-22 DIAGNOSIS — C7B8 Other secondary neuroendocrine tumors: Secondary | ICD-10-CM | POA: Diagnosis not present

## 2019-03-22 DIAGNOSIS — C7A1 Malignant poorly differentiated neuroendocrine tumors: Secondary | ICD-10-CM | POA: Insufficient documentation

## 2019-03-22 DIAGNOSIS — Z79899 Other long term (current) drug therapy: Secondary | ICD-10-CM | POA: Insufficient documentation

## 2019-03-22 MED ORDER — LANREOTIDE ACETATE 120 MG/0.5ML ~~LOC~~ SOLN
120.0000 mg | Freq: Once | SUBCUTANEOUS | Status: AC
  Administered 2019-03-22: 10:00:00 120 mg via SUBCUTANEOUS
  Filled 2019-03-22: qty 120

## 2019-03-22 NOTE — Patient Instructions (Signed)
Lanreotide injection What is this medicine? LANREOTIDE (lan REE oh tide) is used to reduce blood levels of growth hormone in patients with a condition called acromegaly. It also works to slow or stop tumor growth in patients with neuroendocrine tumors and treat carcinoid syndrome. This medicine may be used for other purposes; ask your health care provider or pharmacist if you have questions. COMMON BRAND NAME(S): Somatuline [place] What should I tell my health care provider before I take this medicine? They need to know if you have any of these conditions:  diabetes  gallbladder disease  heart disease  kidney disease  liver disease  thyroid disease  an unusual or allergic reaction to lanreotide, other medicines, foods, dyes, or preservatives  pregnant or trying to get pregnant  breast-feeding How should I use this medicine? This medicine is for injection under the skin. It is given by a health care professional in a hospital or clinic setting. Contact your pediatrician or health care professional regarding the use of this medicine in children. Special care may be needed. Overdosage: If you think you have taken too much of this medicine contact a poison control center or emergency room at once. NOTE: This medicine is only for you. Do not share this medicine with others. What if I miss a dose? It is important not to miss your dose. Call your doctor or health care professional if you are unable to keep an appointment. What may interact with this medicine? This medicine may interact with the following medications:  bromocriptine  cyclosporine  certain medicines for blood pressure, heart disease, irregular heart beat  certain medicines for diabetes  quinidine  terfenadine This list may not describe all possible interactions. Give your health care provider a list of all the medicines, herbs, non-prescription drugs, or dietary supplements you use. Also tell them if you smoke,  drink alcohol, or use illegal drugs. Some items may interact with your medicine. What should I watch for while using this medicine? Tell your doctor or healthcare professional if your symptoms do not start to get better or if they get worse. Visit your doctor or health care professional for regular checks on your progress. Your condition will be monitored carefully while you are receiving this medicine. This medicine may increase blood sugar. Ask your healthcare provider if changes in diet or medicines are needed if you have diabetes. You may need blood work done while you are taking this medicine. Women should inform their doctor if they wish to become pregnant or think they might be pregnant. There is a potential for serious side effects to an unborn child. Talk to your health care professional or pharmacist for more information. Do not breast-feed an infant while taking this medicine or for 6 months after stopping it. This medicine has caused ovarian failure in some women. This medicine may interfere with the ability to have a child. Talk with your doctor or health care professional if you are concerned about your fertility. What side effects may I notice from receiving this medicine? Side effects that you should report to your doctor or health care professional as soon as possible:  allergic reactions like skin rash, itching or hives, swelling of the face, lips, or tongue  increased blood pressure  severe stomach pain  signs and symptoms of hgh blood sugar such as being more thirsty or hungry or having to urinate more than normal. You may also feel very tired or have blurry vision.  signs and symptoms of low blood   sugar such as feeling anxious; confusion; dizziness; increased hunger; unusually weak or tired; sweating; shakiness; cold; irritable; headache; blurred vision; fast heartbeat; loss of consciousness  unusually slow heartbeat Side effects that usually do not require medical  attention (report to your doctor or health care professional if they continue or are bothersome):  constipation  diarrhea  dizziness  headache  muscle pain  muscle spasms  nausea  pain, redness, or irritation at site where injected This list may not describe all possible side effects. Call your doctor for medical advice about side effects. You may report side effects to FDA at 1-800-FDA-1088. Where should I keep my medicine? This drug is given in a hospital or clinic and will not be stored at home. NOTE: This sheet is a summary. It may not cover all possible information. If you have questions about this medicine, talk to your doctor, pharmacist, or health care provider.  2020 Elsevier/Gold Standard (2017-10-06 09:13:08)  

## 2019-04-19 ENCOUNTER — Inpatient Hospital Stay: Payer: No Typology Code available for payment source

## 2019-04-19 ENCOUNTER — Other Ambulatory Visit: Payer: Self-pay

## 2019-04-19 ENCOUNTER — Inpatient Hospital Stay
Payer: No Typology Code available for payment source | Attending: Hematology and Oncology | Admitting: Nurse Practitioner

## 2019-04-19 ENCOUNTER — Encounter: Payer: Self-pay | Admitting: Nurse Practitioner

## 2019-04-19 VITALS — BP 111/72 | HR 60 | Temp 98.2°F | Resp 17 | Ht 62.0 in | Wt 169.4 lb

## 2019-04-19 VITALS — BP 112/77 | HR 57 | Temp 98.2°F | Resp 18

## 2019-04-19 DIAGNOSIS — C7A00 Malignant carcinoid tumor of unspecified site: Secondary | ICD-10-CM | POA: Diagnosis present

## 2019-04-19 DIAGNOSIS — E7800 Pure hypercholesterolemia, unspecified: Secondary | ICD-10-CM | POA: Insufficient documentation

## 2019-04-19 DIAGNOSIS — C7B8 Other secondary neuroendocrine tumors: Secondary | ICD-10-CM

## 2019-04-19 DIAGNOSIS — C7A8 Other malignant neuroendocrine tumors: Secondary | ICD-10-CM

## 2019-04-19 DIAGNOSIS — C7B02 Secondary carcinoid tumors of liver: Secondary | ICD-10-CM | POA: Insufficient documentation

## 2019-04-19 MED ORDER — LANREOTIDE ACETATE 120 MG/0.5ML ~~LOC~~ SOLN
120.0000 mg | Freq: Once | SUBCUTANEOUS | Status: AC
  Administered 2019-04-19: 120 mg via SUBCUTANEOUS
  Filled 2019-04-19: qty 120

## 2019-04-19 NOTE — Progress Notes (Signed)
  [place] OFFICE PROGRESS NOTE   Diagnosis: Carcinoid tumor  INTERVAL HISTORY:   Sally Brown returns as scheduled.  She continues monthly lanreotide.  She has a loose stools for a few days after each injection.  She intermittently feels "hot" during the night.  She is having intermittent joint pains, right shoulder, left elbow, knees and lower legs.  She saw orthopedics and had a knee injection.  She notes hair loss.  Objective:  Vital signs in last 24 hours:  Blood pressure 111/72, pulse 60, temperature 98.2 F (36.8 C), temperature source Temporal, resp. rate 17, height 5\' 2"  (1.575 m), weight 169 lb 6.4 oz (76.8 kg), last menstrual period 05/17/2017, SpO2 98 %.    GI: Abdomen soft and nontender.  No hepatomegaly. Vascular: No leg edema. Musculoskeletal: No obvious joint edema, erythema. Skin: No rash.   Lab Results:  Lab Results  Component Value Date   WBC 9.0 06/21/2018   HGB 13.0 06/21/2018   HCT 38.9 06/21/2018   MCV 85.5 06/21/2018   PLT 317 06/21/2018   NEUTROABS 5.6 11/24/2012    Imaging:  No results found.  Medications: I have reviewed the patient's current medications.  Assessment/Plan: 1. Metastatic carcinoid involving liver  05/31/2018 breast MRI-numerous T2 lesions throughout the liver.   06/07/2018 MRI of the liver-numerous hepatic lesions scattered throughout all aspects of the liver.  06/14/2018 PET scan-enlarged and hypermetabolic perihepatic and portacaval lymph nodes; diffuse heterogeneous uptake within the liver with subtle areas of mildly increased FDG uptake corresponding to suspicious lesions identified on MRI.   06/21/2018 biopsy of a right liver lobe mass-low-grade neuroendocrine tumor consistent with metastasis, positive CD56, chromogranin, synaptophysin, CDX-2 and cytokeratin AE1/AE3.   07/07/2018 Netspot-well-differentiated neuroendocrine tumor with metastasis to multiple periportal and peripancreatic lymph nodes,  multiple small bilobar hepatic metastasis, solitary middle mediastinal nodal metastasis. Single focus of uptake in the proximal right humerus. No clear primary tumor identified.  Normal chromogranin A 07/05/2018 and normal 24-hour urine 5 HIAA 08/03/2018  Monthly lanreotide beginning 08/03/2018  CT 12/15/2018-small hepatic metastases, mildly improved, slight increase in size of a porta hepatis node 2. Lobular carcinoma in situ right breast, on tamoxifen, followed by Dr. Lindi Adie 3. Hypercholesterolemia  Disposition: Sally Brown appears stable.  There is no clinical evidence of disease progression.  Plan to continue monthly lanreotide.  We will follow-up on the chromogranin A from today.  Restaging CT abdomen/pelvis prior to next visit.  We discussed the low incidence of alopecia with tamoxifen.  Etiology of arthralgias unclear.  She will continue current regimen of tamoxifen and monthly lanreotide.  She will return for follow-up in 2 to 3 months.  She will contact the office in the interim with any problems.  Plan reviewed with Dr. Benay Spice.    Ned Card ANP/GNP-BC   04/19/2019  9:55 AM

## 2019-04-19 NOTE — Patient Instructions (Signed)
Lanreotide injection What is this medicine? LANREOTIDE (lan REE oh tide) is used to reduce blood levels of growth hormone in patients with a condition called acromegaly. It also works to slow or stop tumor growth in patients with neuroendocrine tumors and treat carcinoid syndrome. This medicine may be used for other purposes; ask your health care provider or pharmacist if you have questions. COMMON BRAND NAME(S): Somatuline [place] What should I tell my health care provider before I take this medicine? They need to know if you have any of these conditions:  diabetes  gallbladder disease  heart disease  kidney disease  liver disease  thyroid disease  an unusual or allergic reaction to lanreotide, other medicines, foods, dyes, or preservatives  pregnant or trying to get pregnant  breast-feeding How should I use this medicine? This medicine is for injection under the skin. It is given by a health care professional in a hospital or clinic setting. Contact your pediatrician or health care professional regarding the use of this medicine in children. Special care may be needed. Overdosage: If you think you have taken too much of this medicine contact a poison control center or emergency room at once. NOTE: This medicine is only for you. Do not share this medicine with others. What if I miss a dose? It is important not to miss your dose. Call your doctor or health care professional if you are unable to keep an appointment. What may interact with this medicine? This medicine may interact with the following medications:  bromocriptine  cyclosporine  certain medicines for blood pressure, heart disease, irregular heart beat  certain medicines for diabetes  quinidine  terfenadine This list may not describe all possible interactions. Give your health care provider a list of all the medicines, herbs, non-prescription drugs, or dietary supplements you use. Also tell them if you smoke,  drink alcohol, or use illegal drugs. Some items may interact with your medicine. What should I watch for while using this medicine? Tell your doctor or healthcare professional if your symptoms do not start to get better or if they get worse. Visit your doctor or health care professional for regular checks on your progress. Your condition will be monitored carefully while you are receiving this medicine. This medicine may increase blood sugar. Ask your healthcare provider if changes in diet or medicines are needed if you have diabetes. You may need blood work done while you are taking this medicine. Women should inform their doctor if they wish to become pregnant or think they might be pregnant. There is a potential for serious side effects to an unborn child. Talk to your health care professional or pharmacist for more information. Do not breast-feed an infant while taking this medicine or for 6 months after stopping it. This medicine has caused ovarian failure in some women. This medicine may interfere with the ability to have a child. Talk with your doctor or health care professional if you are concerned about your fertility. What side effects may I notice from receiving this medicine? Side effects that you should report to your doctor or health care professional as soon as possible:  allergic reactions like skin rash, itching or hives, swelling of the face, lips, or tongue  increased blood pressure  severe stomach pain  signs and symptoms of hgh blood sugar such as being more thirsty or hungry or having to urinate more than normal. You may also feel very tired or have blurry vision.  signs and symptoms of low blood   sugar such as feeling anxious; confusion; dizziness; increased hunger; unusually weak or tired; sweating; shakiness; cold; irritable; headache; blurred vision; fast heartbeat; loss of consciousness  unusually slow heartbeat Side effects that usually do not require medical  attention (report to your doctor or health care professional if they continue or are bothersome):  constipation  diarrhea  dizziness  headache  muscle pain  muscle spasms  nausea  pain, redness, or irritation at site where injected This list may not describe all possible side effects. Call your doctor for medical advice about side effects. You may report side effects to FDA at 1-800-FDA-1088. Where should I keep my medicine? This drug is given in a hospital or clinic and will not be stored at home. NOTE: This sheet is a summary. It may not cover all possible information. If you have questions about this medicine, talk to your doctor, pharmacist, or health care provider.  2020 Elsevier/Gold Standard (2017-10-06 09:13:08)  

## 2019-04-20 LAB — CHROMOGRANIN A: Chromogranin A (ng/mL): 61.2 ng/mL (ref 0.0–101.8)

## 2019-04-24 ENCOUNTER — Telehealth: Payer: Self-pay | Admitting: Oncology

## 2019-04-24 NOTE — Telephone Encounter (Signed)
Scheduled per los. Called and left msg. Mailed printout  °

## 2019-05-23 ENCOUNTER — Ambulatory Visit: Payer: No Typology Code available for payment source

## 2019-05-24 ENCOUNTER — Inpatient Hospital Stay: Payer: No Typology Code available for payment source | Attending: Hematology and Oncology

## 2019-05-24 ENCOUNTER — Other Ambulatory Visit: Payer: Self-pay

## 2019-05-24 ENCOUNTER — Ambulatory Visit
Admission: RE | Admit: 2019-05-24 | Discharge: 2019-05-24 | Disposition: A | Discharge status: Home / Self Care | Payer: No Typology Code available for payment source | Source: Ambulatory Visit | Attending: Family Medicine | Admitting: Family Medicine

## 2019-05-24 VITALS — BP 108/68 | HR 61 | Temp 98.6°F | Resp 17

## 2019-05-24 DIAGNOSIS — C7B02 Secondary carcinoid tumors of liver: Secondary | ICD-10-CM | POA: Diagnosis present

## 2019-05-24 DIAGNOSIS — C7A8 Other malignant neuroendocrine tumors: Secondary | ICD-10-CM | POA: Insufficient documentation

## 2019-05-24 DIAGNOSIS — Z1231 Encounter for screening mammogram for malignant neoplasm of breast: Secondary | ICD-10-CM

## 2019-05-24 DIAGNOSIS — C7B8 Other secondary neuroendocrine tumors: Secondary | ICD-10-CM

## 2019-05-24 MED ORDER — LANREOTIDE ACETATE 120 MG/0.5ML ~~LOC~~ SOLN
120.0000 mg | Freq: Once | SUBCUTANEOUS | Status: AC
  Administered 2019-05-24: 120 mg via SUBCUTANEOUS
  Filled 2019-05-24: qty 120

## 2019-05-24 NOTE — Patient Instructions (Signed)
Lanreotide injection What is this medicine? LANREOTIDE (lan REE oh tide) is used to reduce blood levels of growth hormone in patients with a condition called acromegaly. It also works to slow or stop tumor growth in patients with neuroendocrine tumors and treat carcinoid syndrome. This medicine may be used for other purposes; ask your health care provider or pharmacist if you have questions. COMMON BRAND NAME(S): Somatuline [place] What should I tell my health care provider before I take this medicine? They need to know if you have any of these conditions:  diabetes  gallbladder disease  heart disease  kidney disease  liver disease  thyroid disease  an unusual or allergic reaction to lanreotide, other medicines, foods, dyes, or preservatives  pregnant or trying to get pregnant  breast-feeding How should I use this medicine? This medicine is for injection under the skin. It is given by a health care professional in a hospital or clinic setting. Contact your pediatrician or health care professional regarding the use of this medicine in children. Special care may be needed. Overdosage: If you think you have taken too much of this medicine contact a poison control center or emergency room at once. NOTE: This medicine is only for you. Do not share this medicine with others. What if I miss a dose? It is important not to miss your dose. Call your doctor or health care professional if you are unable to keep an appointment. What may interact with this medicine? This medicine may interact with the following medications:  bromocriptine  cyclosporine  certain medicines for blood pressure, heart disease, irregular heart beat  certain medicines for diabetes  quinidine  terfenadine This list may not describe all possible interactions. Give your health care provider a list of all the medicines, herbs, non-prescription drugs, or dietary supplements you use. Also tell them if you smoke,  drink alcohol, or use illegal drugs. Some items may interact with your medicine. What should I watch for while using this medicine? Tell your doctor or healthcare professional if your symptoms do not start to get better or if they get worse. Visit your doctor or health care professional for regular checks on your progress. Your condition will be monitored carefully while you are receiving this medicine. This medicine may increase blood sugar. Ask your healthcare provider if changes in diet or medicines are needed if you have diabetes. You may need blood work done while you are taking this medicine. Women should inform their doctor if they wish to become pregnant or think they might be pregnant. There is a potential for serious side effects to an unborn child. Talk to your health care professional or pharmacist for more information. Do not breast-feed an infant while taking this medicine or for 6 months after stopping it. This medicine has caused ovarian failure in some women. This medicine may interfere with the ability to have a child. Talk with your doctor or health care professional if you are concerned about your fertility. What side effects may I notice from receiving this medicine? Side effects that you should report to your doctor or health care professional as soon as possible:  allergic reactions like skin rash, itching or hives, swelling of the face, lips, or tongue  increased blood pressure  severe stomach pain  signs and symptoms of hgh blood sugar such as being more thirsty or hungry or having to urinate more than normal. You may also feel very tired or have blurry vision.  signs and symptoms of low blood   sugar such as feeling anxious; confusion; dizziness; increased hunger; unusually weak or tired; sweating; shakiness; cold; irritable; headache; blurred vision; fast heartbeat; loss of consciousness  unusually slow heartbeat Side effects that usually do not require medical  attention (report to your doctor or health care professional if they continue or are bothersome):  constipation  diarrhea  dizziness  headache  muscle pain  muscle spasms  nausea  pain, redness, or irritation at site where injected This list may not describe all possible side effects. Call your doctor for medical advice about side effects. You may report side effects to FDA at 1-800-FDA-1088. Where should I keep my medicine? This drug is given in a hospital or clinic and will not be stored at home. NOTE: This sheet is a summary. It may not cover all possible information. If you have questions about this medicine, talk to your doctor, pharmacist, or health care provider.  2020 Elsevier/Gold Standard (2017-10-06 09:13:08)  

## 2019-06-04 ENCOUNTER — Other Ambulatory Visit: Payer: Self-pay | Admitting: Hematology and Oncology

## 2019-06-13 ENCOUNTER — Encounter: Payer: Self-pay | Admitting: *Deleted

## 2019-06-13 ENCOUNTER — Encounter: Payer: Self-pay | Admitting: Oncology

## 2019-06-13 NOTE — Progress Notes (Signed)
Forms to continue intermittent FMLA for MD appointments received via Mychart and forwarded to [place], [place] to complete. Patient aware.

## 2019-07-02 ENCOUNTER — Inpatient Hospital Stay: Payer: No Typology Code available for payment source | Attending: Hematology and Oncology

## 2019-07-02 ENCOUNTER — Ambulatory Visit (HOSPITAL_COMMUNITY)
Admission: RE | Admit: 2019-07-02 | Discharge: 2019-07-02 | Disposition: A | Discharge status: Home / Self Care | Payer: No Typology Code available for payment source | Source: Ambulatory Visit | Attending: Nurse Practitioner | Admitting: Nurse Practitioner

## 2019-07-02 ENCOUNTER — Other Ambulatory Visit: Payer: Self-pay

## 2019-07-02 ENCOUNTER — Encounter (HOSPITAL_COMMUNITY): Payer: Self-pay

## 2019-07-02 DIAGNOSIS — C7A8 Other malignant neuroendocrine tumors: Secondary | ICD-10-CM | POA: Diagnosis not present

## 2019-07-02 DIAGNOSIS — Z79810 Long term (current) use of selective estrogen receptor modulators (SERMs): Secondary | ICD-10-CM | POA: Insufficient documentation

## 2019-07-02 DIAGNOSIS — C7B8 Other secondary neuroendocrine tumors: Secondary | ICD-10-CM | POA: Diagnosis present

## 2019-07-02 DIAGNOSIS — D0511 Intraductal carcinoma in situ of right breast: Secondary | ICD-10-CM | POA: Insufficient documentation

## 2019-07-02 DIAGNOSIS — C7B02 Secondary carcinoid tumors of liver: Secondary | ICD-10-CM | POA: Insufficient documentation

## 2019-07-02 DIAGNOSIS — Z170 Estrogen receptor positive status [ER+]: Secondary | ICD-10-CM | POA: Insufficient documentation

## 2019-07-02 DIAGNOSIS — Z79899 Other long term (current) drug therapy: Secondary | ICD-10-CM | POA: Insufficient documentation

## 2019-07-02 LAB — CMP (CANCER CENTER ONLY)
ALT: 40 U/L (ref 0–44)
AST: 31 U/L (ref 15–41)
Albumin: 3.7 g/dL (ref 3.5–5.0)
Alkaline Phosphatase: 74 U/L (ref 38–126)
Anion gap: 10 (ref 5–15)
BUN: 12 mg/dL (ref 6–20)
CO2: 25 mmol/L (ref 22–32)
Calcium: 9.2 mg/dL (ref 8.9–10.3)
Chloride: 109 mmol/L (ref 98–111)
Creatinine: 0.85 mg/dL (ref 0.44–1.00)
GFR, Est AFR Am: >60 mL/min (ref >60)
GFR, Estimated: >60 mL/min (ref >60)
Glucose, Bld: 104 mg/dL — ABNORMAL HIGH (ref 70–99)
Potassium: 4.3 mmol/L (ref 3.5–5.1)
Sodium: 144 mmol/L (ref 135–145)
Total Bilirubin: 0.4 mg/dL (ref 0.3–1.2)
Total Protein: 6.7 g/dL (ref 6.5–8.1)

## 2019-07-02 MED ORDER — SODIUM CHLORIDE (PF) 0.9 % IJ SOLN
INTRAMUSCULAR | Status: AC
  Filled 2019-07-02: qty 50

## 2019-07-02 MED ORDER — IOHEXOL 300 MG/ML  SOLN
100.0000 mL | Freq: Once | INTRAMUSCULAR | Status: AC | PRN
  Administered 2019-07-02: 100 mL via INTRAVENOUS

## 2019-07-03 LAB — CHROMOGRANIN A: Chromogranin A (ng/mL): 76.2 ng/mL (ref 0.0–101.8)

## 2019-07-09 ENCOUNTER — Encounter: Payer: Self-pay | Admitting: Oncology

## 2019-07-10 ENCOUNTER — Telehealth: Payer: Self-pay | Admitting: *Deleted

## 2019-07-10 ENCOUNTER — Inpatient Hospital Stay: Payer: No Typology Code available for payment source

## 2019-07-10 ENCOUNTER — Other Ambulatory Visit: Payer: Self-pay

## 2019-07-10 ENCOUNTER — Inpatient Hospital Stay (HOSPITAL_BASED_OUTPATIENT_CLINIC_OR_DEPARTMENT_OTHER): Payer: No Typology Code available for payment source | Admitting: Oncology

## 2019-07-10 ENCOUNTER — Other Ambulatory Visit (HOSPITAL_COMMUNITY): Payer: Self-pay | Admitting: Oncology

## 2019-07-10 VITALS — BP 122/81 | HR 62 | Temp 97.8°F | Resp 18 | Ht 62.0 in | Wt 170.6 lb

## 2019-07-10 VITALS — BP 117/78 | HR 53 | Temp 98.4°F | Resp 18

## 2019-07-10 DIAGNOSIS — Z170 Estrogen receptor positive status [ER+]: Secondary | ICD-10-CM | POA: Diagnosis not present

## 2019-07-10 DIAGNOSIS — C7A8 Other malignant neuroendocrine tumors: Secondary | ICD-10-CM

## 2019-07-10 DIAGNOSIS — Z79899 Other long term (current) drug therapy: Secondary | ICD-10-CM | POA: Diagnosis not present

## 2019-07-10 DIAGNOSIS — Z79810 Long term (current) use of selective estrogen receptor modulators (SERMs): Secondary | ICD-10-CM | POA: Diagnosis not present

## 2019-07-10 DIAGNOSIS — D0511 Intraductal carcinoma in situ of right breast: Secondary | ICD-10-CM | POA: Diagnosis not present

## 2019-07-10 DIAGNOSIS — C7B8 Other secondary neuroendocrine tumors: Secondary | ICD-10-CM | POA: Diagnosis not present

## 2019-07-10 DIAGNOSIS — C7B00 Secondary carcinoid tumors, unspecified site: Secondary | ICD-10-CM

## 2019-07-10 DIAGNOSIS — C7B02 Secondary carcinoid tumors of liver: Secondary | ICD-10-CM | POA: Diagnosis not present

## 2019-07-10 MED ORDER — LANREOTIDE ACETATE 120 MG/0.5ML ~~LOC~~ SOLN
120.0000 mg | Freq: Once | SUBCUTANEOUS | Status: AC
  Administered 2019-07-10: 120 mg via SUBCUTANEOUS
  Filled 2019-07-10: qty 120

## 2019-07-10 NOTE — Telephone Encounter (Addendum)
Per Dr. Benay Spice: He spoke w/Dr. Ilda Foil, who wants patient to have a Netspot scan before he sees her.  Called patient notified.

## 2019-07-10 NOTE — Progress Notes (Signed)
  [place] OFFICE PROGRESS NOTE   Diagnosis: Carcinoid tumor  INTERVAL HISTORY:   Ms. Scovill continues monthly lanreotide. She feels well. No diarrhea. No flushing. No complaint.  Objective:  Vital signs in last 24 hours:  Blood pressure 122/81, pulse 62, temperature 97.8 F (36.6 C), temperature source Temporal, resp. rate 18, height 5\' 2"  (1.575 m), weight 170 lb 9.6 oz (77.4 kg), last menstrual period 05/17/2017, SpO2 95 %.    Resp: Lungs clear bilaterally Cardio: Regular rate and rhythm GI: No hepatosplenomegaly, nontender, no mass Vascular: No leg edema   Lab Results:  Lab Results  Component Value Date   WBC 9.0 06/21/2018   HGB 13.0 06/21/2018   HCT 38.9 06/21/2018   MCV 85.5 06/21/2018   PLT 317 06/21/2018   NEUTROABS 5.6 11/24/2012    CMP  Lab Results  Component Value Date   NA 144 07/02/2019   K 4.3 07/02/2019   CL 109 07/02/2019   CO2 25 07/02/2019   GLUCOSE 104 (H) 07/02/2019   BUN 12 07/02/2019   CREATININE 0.85 07/02/2019   CALCIUM 9.2 07/02/2019   PROT 6.7 07/02/2019   ALBUMIN 3.7 07/02/2019   AST 31 07/02/2019   ALT 40 07/02/2019   ALKPHOS 74 07/02/2019   BILITOT 0.4 07/02/2019   GFRNONAA >60 07/02/2019   GFRAA >60 07/02/2019    Medications: I have reviewed the patient's current medications.   Assessment/Plan:  1. Metastatic carcinoid involving liver  05/31/2018 breast MRI-numerous T2 lesions throughout the liver.   06/07/2018 MRI of the liver-numerous hepatic lesions scattered throughout all aspects of the liver.  06/14/2018 PET scan-enlarged and hypermetabolic perihepatic and portacaval lymph nodes; diffuse heterogeneous uptake within the liver with subtle areas of mildly increased FDG uptake corresponding to suspicious lesions identified on MRI.   06/21/2018 biopsy of a right liver lobe mass-low-grade neuroendocrine tumor consistent with metastasis, positive CD56, chromogranin, synaptophysin, CDX-2 and cytokeratin  AE1/AE3.   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 Single focus of uptake in the proximal right humerus. No clear primary tumor identified.  Normal chromogranin A 07/05/2018 and normal 24-hour urine 5 HIAA 08/03/2018  Monthly lanreotide beginning 08/03/2018  CT 12/15/2018-small hepatic metastases, mildly improved, slight increase in size of a porta hepatis node  CT abdomen/pelvis 07/02/2019-some of the liver lesions are slightly larger, others stable or smaller, slight increase in splenic lesions, minimal increase in abdominal adenopathy 2. Lobular carcinoma in situ right breast, on tamoxifen, followed by Dr. Lindi Adie 3. Hypercholesterolemia   Disposition: Ms. Mcbain appears unchanged. The CT reveals overall stable disease with a slight increase in size of some of the liver lesions and abdominal lymph nodes. She is asymptomatic from the carcinoid disease. She will continue monthly lanreotide.  We discussed the indication for Lutathera treatment. She qualifies for Lutathera based on the slight disease progression on the current CT. I will refer her to Dr. Leonia Reeves. She will be scheduled for restaging Netspot to look for evidence of systemic progression and to follow-up on the femur lesion in the humerus on the previous Netspot scan.  She will be scheduled for an office visit here in 3 months.  Betsy Coder, MD  07/10/2019  9:53 AM

## 2019-07-12 ENCOUNTER — Telehealth: Payer: Self-pay | Admitting: Oncology

## 2019-07-12 NOTE — Telephone Encounter (Signed)
Scheduled appts per 6/29 los. Pt confirmed appt dates and times.

## 2019-07-26 ENCOUNTER — Other Ambulatory Visit (HOSPITAL_COMMUNITY): Payer: Self-pay | Admitting: Oncology

## 2019-07-26 ENCOUNTER — Ambulatory Visit (HOSPITAL_COMMUNITY)
Admission: RE | Admit: 2019-07-26 | Discharge: 2019-07-26 | Disposition: A | Discharge status: Home / Self Care | Payer: No Typology Code available for payment source | Source: Ambulatory Visit | Attending: Oncology | Admitting: Oncology

## 2019-07-26 ENCOUNTER — Other Ambulatory Visit: Payer: Self-pay

## 2019-07-26 DIAGNOSIS — C7A8 Other malignant neuroendocrine tumors: Secondary | ICD-10-CM

## 2019-07-26 DIAGNOSIS — C7B8 Other secondary neuroendocrine tumors: Secondary | ICD-10-CM | POA: Diagnosis present

## 2019-07-26 MED ORDER — GALLIUM GA 68 DOTATATE IV KIT: 4.5000 | PACK | Freq: Once | INTRAVENOUS | Status: DC | PRN

## 2019-07-26 NOTE — Consult Note (Signed)
Chief Complaint: Patient with metastatic neuroendocrine tumor for  evaluation Peptide receptor radiotherapy (PRRT) with YB017 DOTATATE (Lutathera).  Referring Physician(s): Sherrill    Patient Status: [place] - Out-pt  History of Present Illness: 54 year old female with well differentiated neuroendocrine tumor with liver metastasis   Initial disease discovered incidentally breast MRI which noted liver lesions.  Liver lesions biopsied in June 2020 revealing well differentiated neuroendocrine tumor.  Low-grade.   Subsequent DOTATATE PET scan (07/07/2018) demonstrated well differentiated neuroendocrine tumor metastasis in liver and porta hepatis nodes.   Normal chromogranin A and urine serotonin.  No symptoms related carcinoid disease.   Monthly Sandostatin injections initiated 08/03/2018.   Subsequent follow-up surveillance CT and DOTATATE PET scan (07/26/1999) demonstrate progression hepatic metastasis as well as confirmation skeletal metastasis.    Past Medical History:  Diagnosis Date  . Family history of breast cancer   . PONV (postoperative nausea and vomiting)   . SVD (spontaneous vaginal delivery)    x 3    Past Surgical History:  Procedure Laterality Date  . BILATERAL SALPINGECTOMY Bilateral 06/01/2017   Procedure: BILATERAL SALPINGECTOMY;  Surgeon: Christophe Louis, MD;  Location: [facility];  Service: Gynecology;  Laterality: Bilateral;  . BREAST BIOPSY Left 2009  . CHOLECYSTECTOMY  2002  . COLONOSCOPY     polyp  . CYSTOSCOPY N/A 06/01/2017   Procedure: CYSTOSCOPY;  Surgeon: Bjorn Loser, MD;  Location: [facility];  Service: Urology;  Laterality: N/A;  . DILATION AND CURETTAGE OF UTERUS  2005  . left arm surgery     nerve relaese  . NovaSure Ablation  2006  . PUBOVAGINAL SLING N/A 06/01/2017   Procedure: Gaynelle Arabian;  Surgeon: Bjorn Loser, MD;  Location: [facility];  Service: Urology;  Laterality: N/A;  . TUBAL LIGATION    . VAGINAL HYSTERECTOMY N/A  06/01/2017   Procedure: HYSTERECTOMY VAGINAL;  Surgeon: Christophe Louis, MD;  Location: [facility];  Service: Gynecology;  Laterality: N/A;    Allergies: Codeine and Sulfa antibiotics  Medications: Prior to Admission medications   Medication Sig Start Date End Date Taking? Authorizing Provider  rosuvastatin (CRESTOR) 5 MG tablet Take 5 mg by mouth daily. 05/24/18   [provider]  tamoxifen (NOLVADEX) 20 MG tablet TAKE 1 TABLET(20 MG) BY MOUTH DAILY 06/04/19   Nicholas Lose, MD     Family History  Problem Relation Age of Onset  . Breast cancer Mother 74       again at age 71  . Other Maternal Grandmother        died in childbirth  . Colon cancer Paternal Grandmother   . Breast cancer Other        3 of MGMs sisters    Social History   Socioeconomic History  . Marital status: Single    Spouse name: Not on file  . Number of children: Not on file  . Years of education: Not on file  . Highest education level: Not on file  Occupational History  . Not on file  Tobacco Use  . Smoking status: Never Smoker  . Smokeless tobacco: Never Used  Vaping Use  . Vaping Use: Never used  Substance and Sexual Activity  . Alcohol use: Yes    Alcohol/week: 2.0 - 3.0 standard drinks    Types: 2 - 3 Glasses of wine per week  . Drug use: Never  . Sexual activity: Yes    Birth control/protection: Surgical  Other Topics Concern  . Not on file  Social History Narrative  .  Not on file   Social Determinants of Health   Financial Resource Strain:   . Difficulty of Paying Living Expenses:   Food Insecurity:   . Worried About Charity fundraiser in the Last Year:   . Arboriculturist in the Last Year:   Transportation Needs:   . Film/video editor (Medical):   Marland Kitchen Lack of Transportation (Non-Medical):   Physical Activity:   . Days of Exercise per Week:   . Minutes of Exercise per Session:   Stress:   . Feeling of Stress :   Social Connections:   . Frequency of Communication with  Friends and Family:   . Frequency of Social Gatherings with Friends and Family:   . Attends Religious Services:   . Active Member of Clubs or Organizations:   . Attends Archivist Meetings:   Marland Kitchen Marital Status:     ECOG Status: 0 - Asymptomatic  Review of Systems: A 12 point ROS discussed and pertinent positives are indicated in the HPI above.  All other systems are negative.  Review of Systems  Vital Signs: LMP 05/17/2017 (Approximate)   Physical Exam  Imaging: NM PET (NETSPOT GA 71 DOTATATE) SKULL BASE TO MID THIGH  Result Date: 07/26/2019 CLINICAL DATA:  Well differentiated neuroendocrine tumor with liver metastasis and periportal nodal metastasis. Suspicion skeletal metastasis. EXAM: NUCLEAR MEDICINE PET SKULL BASE TO THIGH TECHNIQUE: 4.5 mCi Ga 38 DOTATATE was injected intravenously. Full-ring PET imaging was performed from the skull base to thigh after the radiotracer. CT data was obtained and used for attenuation correction and anatomic localization. COMPARISON:  Dotatate PET-CT 07/07/2018,  diagnostic CT scan 07/02/18 FINDINGS: NECK No radiotracer activity in neck lymph nodes. Incidental CT findings: None CHEST Intense radiotracer activity again noted in small the subcarinal lymph node with SUV max equal 11.1 compared SUV max equal 6.8. Lymph node measures only 5 mm on image 61/4. Focal activity associated with a small LEFT supraclavicular node measures only 3 mm with SUV max equal 3.9 (image 41). Incidental CT finding:No pulmonary parenchymal nodularity ABDOMEN/PELVIS Again demonstrated multifocal hepatic metastasis with intense radiotracer activity. These are difficult to define on the noncontrast CT however are appear larger and more intense on PET imaging. For example lesions in the a posterior RIGHT hepatic lobe with SUV max equal 31 increased from SUV max equal 14. These lesions were measured to increase on most recent diagnostic CT abdomen scan. Lesion in the anterior  central LEFT hepatic lobe with SUV max equal 23 increased from SUV max equal 13 (image 80). Several small lesions are now more conspicuous. Example lesion anterior margin of the RIGHT hepatic lobe with SUV max equal 25 (image 96) increased from SUV max equal 17. Again demonstrated several lymph nodes adjacent pancreatic head and porta hepatis which are small but intensely hypermetabolic. Example node measures measures 16 mm (image 95/4) compared to 16 mm with SUV max equal 26 compared SUV max equal 25. Node adjacent head of the pancreas measures 14 mm compared to 13 mm with SUV max equal 25 compared SUV max 24 Several smaller radiotracer avid nodes are present at the root of the mesentery similar prior. No discrete small bowel lesions identified. No evidence of peritoneal metastasis in pelvis. Physiologic activity noted in the liver, spleen, adrenal glands and kidneys. Incidental CT findings:None SKELETON Now more convincing evidence of skeletal metastasis. Lesion in the proximal RIGHT humerus with SUV max equal 9.5 increased from SUV max equal 4.5. There may  be a small sclerotic component this lesion on image 30/4. Lesion in the proximal LEFT femur humerus with SUV max equal 3.9 (image 26). Focal lesion in the LEFT transverse process of the T3 vertebral body with SUV max equal 5.0 ( image 45). Focal lesion in the posterior aspect of the RIGHT acetabulum with SUV max equal 3.9. These lesions are occult by CT imaging Incidental CT findings:None IMPRESSION: 1. Mild progression of well differentiated neuroendocrine tumor metastasis compared to DOTATATE PET scan 07/07/2018 2. Interval progression of multifocal hepatic metastasis. No new lesions identified. 3. Interval progression small volume mediastinal nodal metastasis with two very small metastatic nodes. 4. Progression of skeletal metastasis with approximately 4 discrete very small radiotracer avid lesions within the proximal humeri, spine and pelvis. Electronically  Signed   By: Suzy Bouchard M.D.   On: 07/26/2019 11:03    Labs:  CBC: No results for input(s): WBC, HGB, HCT, PLT in the last 8760 hours.  COAGS: No results for input(s): INR, APTT in the last 8760 hours.  BMP: Recent Labs    11/23/18 0920 07/02/19 1019  NA 143 144  K 3.9 4.3  CL 108 109  CO2 26 25  GLUCOSE 116* 104*  BUN 8 12  CALCIUM 8.8* 9.2  CREATININE 0.74 0.85  GFRNONAA >60 >60  GFRAA >60 >60    LIVER FUNCTION TESTS: Recent Labs    11/23/18 0920 07/02/19 1019  BILITOT 0.5 0.4  AST 16 31  ALT 19 40  ALKPHOS 58 74  PROT 6.9 6.7  ALBUMIN 3.9 3.7    TUMOR MARKERS: Recent Labs    11/23/18 0920 04/19/19 0905 07/02/19 1019  CHROMOGA 35.8 61.2 76.2    Assessment and Plan:  [Patient is a candidate for peptide receptor radiotherapy with Progressive well differentiated neuroendocrine tumor with liver metastasis, periportal nodal metastasis and confirmed skeletal metastasis on most recent DOTATATE PET scan 07/26/2018.  The lesions identified on Ga68 DOTATATE PET scan are intensely avid for radiotracer and therefore would anticipate good efficacy with Lu 177 DOTATATE peptide receptor radiotherapy   The patient was counseled on the primary goal of therapy which is prolongation of progression free survival (79% improvement over standard therapy). Overall all survival data is acrueing with approximately 48% increase. Secondary goals would include decrease in tumor burden.    Primary of toxicities of therapy were explained to patient including marrow suppression, renal toxicity and hepatic toxicity.  Rare toxicity of myelosuppression and leukemia also explained.  Potential toxicity will be monitored throughout the course of therapy with interval  CBC and CMP laboratory evaluation.    Four therapies will be scheduled  2 months apart over a  six-month interval.  Patient will receive IM Sandostatin injection in the molecular imaging department after each therapy.   Patient will return to oncology clinic 1 month following each therapy for CBC and CMP and IM Sandostatin injection.  First treatment scheduled for 08/14/19.   Thank you for this interesting consult.  I greatly enjoyed meeting Sally Brown and look forward to participating in their care.  A copy of this report was sent to the requesting provider on this date.  Electronically Signed: Rennis Golden, MD 07/26/2019, 12:06 PM   I spent a total of  30 Minutes   in face to face in clinical consultation, greater than 50% of which was counseling/coordinating care for metastatic neuroendocrine tumor.

## 2019-08-03 ENCOUNTER — Telehealth: Payer: Self-pay | Admitting: Oncology

## 2019-08-03 NOTE — Telephone Encounter (Signed)
Scheduled appt per 7/23 sch msg - left message for patient with appt date and time

## 2019-08-13 ENCOUNTER — Encounter: Payer: Self-pay | Admitting: Cardiology

## 2019-08-13 ENCOUNTER — Ambulatory Visit: Payer: PRIVATE HEALTH INSURANCE

## 2019-08-13 ENCOUNTER — Ambulatory Visit: Payer: PRIVATE HEALTH INSURANCE | Admitting: Cardiology

## 2019-08-13 ENCOUNTER — Other Ambulatory Visit: Payer: Self-pay

## 2019-08-13 VITALS — BP 123/80 | HR 77 | Ht 62.0 in | Wt 175.0 lb

## 2019-08-13 DIAGNOSIS — R002 Palpitations: Secondary | ICD-10-CM

## 2019-08-13 DIAGNOSIS — R0609 Other forms of dyspnea: Secondary | ICD-10-CM

## 2019-08-13 DIAGNOSIS — E7800 Pure hypercholesterolemia, unspecified: Secondary | ICD-10-CM

## 2019-08-13 NOTE — Progress Notes (Signed)
Primary Physician/Referring:  Mayra Neer, MD  Patient ID: Sally Brown, female    DOB: 01/29/1965, 54 y.o.   MRN: [id number]  Chief Complaint  Patient presents with  . New Patient (Initial Visit)  . Chest Pain  . Shortness of Breath  . Palpitations   HPI:    Sally Brown  is a 54 y.o.  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 Betsy Coder now referred to me for evaluation of dyspnea on exertion and palpitations.  Patient states that over the past few months she has noticed that she gives out easily with exertional activities.  No PND or orthopnea or leg edema.  She also complains of palpitations that occur suddenly and last several minutes and spontaneously subside.  No relationship between dyspnea and palpitations.  During the episodes of palpitation she does feel slightly dyspneic and also has mild chest discomfort.  Past Medical History:  Diagnosis Date  . Family history of breast cancer   . Hyperlipidemia   . PONV (postoperative nausea and vomiting)   . SVD (spontaneous vaginal delivery)    x 3   Past Surgical History:  Procedure Laterality Date  . BILATERAL SALPINGECTOMY Bilateral 06/01/2017   Procedure: BILATERAL SALPINGECTOMY;  Surgeon: Christophe Louis, MD;  Location: [facility];  Service: Gynecology;  Laterality: Bilateral;  . BREAST BIOPSY Left 2009  . CHOLECYSTECTOMY  2002  . COLONOSCOPY     polyp  . CYSTOSCOPY N/A 06/01/2017   Procedure: CYSTOSCOPY;  Surgeon: Bjorn Loser, MD;  Location: [facility];  Service: Urology;  Laterality: N/A;  . DILATION AND CURETTAGE OF UTERUS  2005  . left arm surgery     nerve relaese  . NovaSure Ablation  2006  . PUBOVAGINAL SLING N/A 06/01/2017   Procedure: Gaynelle Arabian;  Surgeon: Bjorn Loser, MD;  Location: [facility];  Service: Urology;  Laterality: N/A;  . TUBAL LIGATION    . VAGINAL  HYSTERECTOMY N/A 06/01/2017   Procedure: HYSTERECTOMY VAGINAL;  Surgeon: Christophe Louis, MD;  Location: [facility];  Service: Gynecology;  Laterality: N/A;   Family History  Problem Relation Age of Onset  . Breast cancer Mother 23       again at age 52  . Heart attack Mother   . Lung cancer Mother   . Hyperlipidemia Mother   . Dementia Father   . Other Maternal Grandmother        died in childbirth  . Colon cancer Paternal Grandmother   . Breast cancer Other        3 of MGMs sisters    Social History   Tobacco Use  . Smoking status: Never Smoker  . Smokeless tobacco: Never Used  Substance Use Topics  . Alcohol use: Yes    Alcohol/week: 2.0 - 3.0 standard drinks    Types: 2 - 3 Glasses of wine per week   Marital Status: Single  ROS  Review of Systems  Cardiovascular: Positive for dyspnea on exertion and palpitations. Negative for chest pain and leg swelling.  Gastrointestinal: Negative for melena.   Objective  Blood pressure 123/80, pulse 77, height 5' 2"  (1.575 m), weight 175 lb (79.4 kg), last menstrual period 05/17/2017, SpO2 95 %.  Vitals with BMI 08/14/2019 08/14/2019 08/14/2019  Height - - -  Weight - - -  BMI - - -  Systolic 413 244 010  Diastolic 67 78 73  Pulse 65 58 49     Physical Exam Cardiovascular:     Rate and Rhythm: Normal rate and regular rhythm.     Pulses: Intact distal pulses.     Heart sounds: Normal heart sounds. No murmur heard.  No gallop.      Comments: No leg edema, no JVD. Pulmonary:     Effort: Pulmonary effort is normal.     Breath sounds: Normal breath sounds.  Abdominal:     General: Bowel sounds are normal.     Palpations: Abdomen is soft.    Laboratory examination:   Recent Labs    11/23/18 0920 07/02/19 1019 08/14/19 0826  NA 143 144 141  K 3.9 4.3 4.1  CL 108 109 108  CO2 26 25 24   GLUCOSE 116* 104* 119*  BUN 8 12 20   CREATININE 0.74 0.85 0.75  CALCIUM 8.8* 9.2 9.1  GFRNONAA >60 >60 >60  GFRAA >60 >60 >60   estimated  creatinine clearance is 79.3 mL/min (by C-G formula based on SCr of 0.75 mg/dL).  CMP Latest Ref Rng & Units 08/14/2019 07/02/2019 11/23/2018  Glucose 70 - 99 mg/dL 119(H) 104(H) 116(H)  BUN 6 - 20 mg/dL 20 12 8   Creatinine 0.44 - 1.00 mg/dL 0.75 0.85 0.74  Sodium 135 - 145 mmol/L 141 144 143  Potassium 3.5 - 5.1 mmol/L 4.1 4.3 3.9  Chloride 98 - 111 mmol/L 108 109 108  CO2 22 - 32 mmol/L 24 25 26   Calcium 8.9 - 10.3 mg/dL 9.1 9.2 8.8(L)  Total Protein 6.5 - 8.1 g/dL 6.7 6.7 6.9  Total Bilirubin 0.3 - 1.2 mg/dL 0.3 0.4 0.5  Alkaline Phos 38 - 126 U/L 80 74 58  AST 15 - 41 U/L 34 31 16  ALT 0 - 44 U/L 42 40 19   CBC Latest Ref Rng & Units 08/14/2019 06/21/2018 06/01/2017  WBC 4.0 - 10.5 K/uL 8.4 9.0 16.7(H)  Hemoglobin 12.0 - 15.0 g/dL 13.2 13.0 11.8(L)  Hematocrit 36 - 46 % 40.1 38.9 35.1(L)  Platelets 150 - 400 K/uL 271 317 250    Lipid Panel No results for input(s): CHOL, TRIG, LDLCALC, VLDL, HDL, CHOLHDL, LDLDIRECT in the last 8760 hours.  HEMOGLOBIN A1C No results found for: HGBA1C, MPG TSH No results for input(s): TSH in the last 8760 hours.  External labs:   Cholesterol, total 209.000 11/15/2018 HDL 34.000 11/15/2018 LDL-C 141.000 11/15/2018 Triglycerides 172.000 11/15/2018  Medications and allergies   Allergies  Allergen Reactions  . Codeine Hives  . Sulfa Antibiotics Hives     Outpatient Medications Prior to Visit  Medication Sig Dispense Refill  . cetirizine (ZYRTEC) 10 MG tablet Take 10 mg by mouth daily.    . Lanreotide Acetate 60 MG/0.2ML SOLN Inject into the skin every 30 (thirty) days.    . rosuvastatin (CRESTOR) 5 MG tablet Take 5 mg by mouth every other day.     . tamoxifen (NOLVADEX) 20 MG tablet TAKE 1 TABLET(20 MG) BY MOUTH DAILY 90 tablet 3   No facility-administered medications prior to visit.   Radiology:   CT of the abdomen and pelvis 07/02/2019: 1. Hepatic metastasis. These are suboptimally evaluated secondary to lack of arterial phase imaging.  Given this mild limitation, felt to be slightly progressive. Although some lesions measure similar and smaller, dominant right hepatic lobe lesion has significantly enlarged. 2. Minimal increase in abdominal adenopathy, most consistent with progressive metastasis. 3. Low-density splenic lesions, nonspecific but suspicious for  metastatic disease. Slightly increased. 4.  Aortic Atherosclerosis (ICD10-I70.0).  Emphysema (ICD10-J43.9).  Cardiac Studies:   None  EKG  EKG 08/13/2019: Normal sinus rhythm at rate of 61 bpm, normal axis.  Poor R wave progression, probably normal variant.  No evidence of ischemia, normal EKG.      Assessment     ICD-10-CM   1. Palpitations  R00.2 EKG 12-Lead    LONG TERM MONITOR (3-14 DAYS)    PCV ECHOCARDIOGRAM COMPLETE    PCV CARDIAC STRESS TEST  2. Dyspnea on exertion  R06.00 LONG TERM MONITOR (3-14 DAYS)    PCV ECHOCARDIOGRAM COMPLETE    PCV CARDIAC STRESS TEST  3. Hypercholesteremia  E78.00      There are no discontinued medications.  No orders of the defined types were placed in this encounter.   Recommendations:   AYODELE HARTSOCK is a 54 y.o. 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 Betsy Coder now referred to me for evaluation of dyspnea on exertion and palpitations.  Her dyspnea Brown be related to mild obesity and deconditioning.  Symptoms of palpitations Brown represent either PACs and PVCs versus SVT as well. Will schedule for an echocardiogram. I will set up for a Event/Holter monitor for 1week days. Explained how to use it and to activate the device. Schedule for routine treadmill stress test. I will personally perform the test and if I find abnormalities,  will perform further evaluation. Otherwise unless new on ongoing symptoms(patient advised to contact us), preventive  therapy is recommended. I  will then see the patient on a PRN basis. She is aware of my mobile telephone # and she used to work in the [employer] previously and I have worked with her. I have reassured her.   I suspect that the neuroendocrine tumor will respond to the new therapy recommended both by her primary oncologist and at [facility].  With regard to hyperlipidemia, weight loss should help with improvement of triglyceride levels and also LDL.  In the absence of hypertension and diabetes and patient being a non-smoker, do not recommend starting therapy at least at this point until she goes through her chemotherapy for neuroendocrine tumor.  I did note abdominal aortic atherosclerosis during CT scan of the pelvis and abdomen performed recently.  Hence would strongly consider statin therapy once she is stable.   Adrian Prows, MD, [facility] 08/16/2019, 4:16 PM Office: [phone]

## 2019-08-14 ENCOUNTER — Inpatient Hospital Stay: Payer: No Typology Code available for payment source

## 2019-08-14 ENCOUNTER — Ambulatory Visit (HOSPITAL_COMMUNITY)
Admission: RE | Admit: 2019-08-14 | Discharge: 2019-08-14 | Disposition: A | Discharge status: Home / Self Care | Payer: No Typology Code available for payment source | Source: Ambulatory Visit | Attending: Oncology | Admitting: Oncology

## 2019-08-14 DIAGNOSIS — C7B8 Other secondary neuroendocrine tumors: Secondary | ICD-10-CM | POA: Insufficient documentation

## 2019-08-14 DIAGNOSIS — C7A8 Other malignant neuroendocrine tumors: Secondary | ICD-10-CM | POA: Diagnosis present

## 2019-08-14 LAB — COMPREHENSIVE METABOLIC PANEL
ALT: 42 U/L (ref 0–44)
AST: 34 U/L (ref 15–41)
Albumin: 3.9 g/dL (ref 3.5–5.0)
Alkaline Phosphatase: 80 U/L (ref 38–126)
Anion gap: 9 (ref 5–15)
BUN: 20 mg/dL (ref 6–20)
CO2: 24 mmol/L (ref 22–32)
Calcium: 9.1 mg/dL (ref 8.9–10.3)
Chloride: 108 mmol/L (ref 98–111)
Creatinine, Ser: 0.75 mg/dL (ref 0.44–1.00)
GFR calc Af Amer: >60 mL/min (ref >60)
GFR calc non Af Amer: >60 mL/min (ref >60)
Glucose, Bld: 119 mg/dL — ABNORMAL HIGH (ref 70–99)
Potassium: 4.1 mmol/L (ref 3.5–5.1)
Sodium: 141 mmol/L (ref 135–145)
Total Bilirubin: 0.3 mg/dL (ref 0.3–1.2)
Total Protein: 6.7 g/dL (ref 6.5–8.1)

## 2019-08-14 LAB — CBC WITH DIFFERENTIAL/PLATELET
Abs Immature Granulocytes: 0.01 10*3/uL (ref 0.00–0.07)
Basophils Absolute: 0.1 10*3/uL (ref 0.0–0.1)
Basophils Relative: 1 %
Eosinophils Absolute: 0.5 10*3/uL (ref 0.0–0.5)
Eosinophils Relative: 6 %
HCT: 40.1 % (ref 36.0–46.0)
Hemoglobin: 13.2 g/dL (ref 12.0–15.0)
Immature Granulocytes: 0 %
Lymphocytes Relative: 32 %
Lymphs Abs: 2.6 10*3/uL (ref 0.7–4.0)
MCH: 29.3 pg (ref 26.0–34.0)
MCHC: 32.9 g/dL (ref 30.0–36.0)
MCV: 88.9 fL (ref 80.0–100.0)
Monocytes Absolute: 0.6 10*3/uL (ref 0.1–1.0)
Monocytes Relative: 7 %
Neutro Abs: 4.6 10*3/uL (ref 1.7–7.7)
Neutrophils Relative %: 54 %
Platelets: 271 10*3/uL (ref 150–400)
RBC: 4.51 MIL/uL (ref 3.87–5.11)
RDW: 13.1 % (ref 11.5–15.5)
WBC: 8.4 10*3/uL (ref 4.0–10.5)
nRBC: 0 % (ref 0.0–0.2)

## 2019-08-14 MED ORDER — ONDANSETRON HCL 8 MG PO TABS
8.0000 mg | ORAL_TABLET | Freq: Two times a day (BID) | ORAL | 0 refills | Status: DC | PRN
Start: 2019-08-14 — End: 2019-10-08

## 2019-08-14 MED ORDER — OCTREOTIDE ACETATE 30 MG IM KIT: 30.0000 mg | PACK | Freq: Once | INTRAMUSCULAR | Status: DC

## 2019-08-14 MED ORDER — AMINO ACID RADIOPROTECTANT - L-LYSINE 2.5%/L-ARGININE 2.5% IN NS
250.0000 mL/h | INTRAVENOUS | Status: AC
  Administered 2019-08-14: 250 mL/h via INTRAVENOUS
  Filled 2019-08-14: qty 1000

## 2019-08-14 MED ORDER — LANREOTIDE ACETATE 120 MG/0.5ML ~~LOC~~ SOLN
120.0000 mg | Freq: Once | SUBCUTANEOUS | Status: AC
  Administered 2019-08-14: 120 mg via SUBCUTANEOUS

## 2019-08-14 MED ORDER — PROCHLORPERAZINE EDISYLATE 10 MG/2ML IJ SOLN: 10.0000 mg | Freq: Four times a day (QID) | INTRAMUSCULAR | Status: DC | PRN

## 2019-08-14 MED ORDER — OCTREOTIDE ACETATE 500 MCG/ML IJ SOLN: 500.0000 ug | Freq: Once | INTRAMUSCULAR | Status: DC | PRN

## 2019-08-14 MED ORDER — LANREOTIDE ACETATE 120 MG/0.5ML ~~LOC~~ SOLN
SUBCUTANEOUS | Status: AC
  Filled 2019-08-14: qty 120

## 2019-08-14 MED ORDER — SODIUM CHLORIDE 0.9 % IV SOLN: 500.0000 mL | Freq: Once | INTRAVENOUS | Status: DC

## 2019-08-14 MED ORDER — SODIUM CHLORIDE 0.9 % IV SOLN
8.0000 mg | Freq: Once | INTRAVENOUS | Status: AC
  Administered 2019-08-14: 8 mg via INTRAVENOUS
  Filled 2019-08-14: qty 4

## 2019-08-14 MED ORDER — LUTETIUM LU 177 DOTATATE 370 MBQ/ML IV SOLN
200.0000 | Freq: Once | INTRAVENOUS | Status: AC
  Administered 2019-08-14: 200 via INTRAVENOUS

## 2019-08-14 NOTE — Progress Notes (Signed)
RADIOPHARMACEUTICALS:  [203.5] mCi Lu 177 DOTATATE  FINDINGS: Diagnosis: [Metastatic neuroendocrine tumor.]  Current Infusion: [1]  Planned Infusions: [4]  Informed consent was obtained following re-expansion risks and benefits of procedure.  The patient's most recent blood counts were reviewed and remains a good candidate to proceed with Lutathera. The patient was situated in an infusion suite and administered Lutathera as above. Patient will follow-up with referring oncologist for interval serum laboratories (CBC and CMP) in approximately 4 weeks.    Patient received 120  mg IM long-acting lanreotide injection 4 hours after Lutathera effusion in the nuclear medicine department.   IMPRESSION: [First] GF [id number] treatment for metastatic neuroendocrine tumor. The patient tolerated the infusion well. The patient will return in 8 weeks for ongoing care.

## 2019-08-15 ENCOUNTER — Ambulatory Visit (HOSPITAL_COMMUNITY): Payer: PRIVATE HEALTH INSURANCE

## 2019-08-24 ENCOUNTER — Ambulatory Visit: Payer: Self-pay | Admitting: Cardiology

## 2019-09-03 NOTE — Progress Notes (Signed)
Occasional PACs and PVCs, no correlation with triggered events.

## 2019-09-05 ENCOUNTER — Other Ambulatory Visit: Payer: Self-pay

## 2019-09-05 ENCOUNTER — Ambulatory Visit: Payer: PRIVATE HEALTH INSURANCE

## 2019-09-05 DIAGNOSIS — R0609 Other forms of dyspnea: Secondary | ICD-10-CM

## 2019-09-05 DIAGNOSIS — R002 Palpitations: Secondary | ICD-10-CM

## 2019-09-10 ENCOUNTER — Other Ambulatory Visit: Payer: Self-pay

## 2019-09-10 ENCOUNTER — Ambulatory Visit: Payer: PRIVATE HEALTH INSURANCE

## 2019-09-10 DIAGNOSIS — R002 Palpitations: Secondary | ICD-10-CM

## 2019-09-10 DIAGNOSIS — R0609 Other forms of dyspnea: Secondary | ICD-10-CM

## 2019-09-12 ENCOUNTER — Other Ambulatory Visit: Payer: Self-pay | Admitting: *Deleted

## 2019-09-12 DIAGNOSIS — C7B8 Other secondary neuroendocrine tumors: Secondary | ICD-10-CM

## 2019-09-13 ENCOUNTER — Other Ambulatory Visit: Payer: Self-pay

## 2019-09-13 ENCOUNTER — Inpatient Hospital Stay (HOSPITAL_BASED_OUTPATIENT_CLINIC_OR_DEPARTMENT_OTHER): Payer: No Typology Code available for payment source | Admitting: Oncology

## 2019-09-13 ENCOUNTER — Inpatient Hospital Stay: Payer: No Typology Code available for payment source | Attending: Hematology and Oncology

## 2019-09-13 ENCOUNTER — Inpatient Hospital Stay: Payer: No Typology Code available for payment source

## 2019-09-13 VITALS — BP 118/69 | HR 58 | Temp 97.6°F | Resp 17 | Ht 62.0 in | Wt 173.4 lb

## 2019-09-13 VITALS — BP 123/82 | HR 61 | Temp 98.4°F | Resp 18

## 2019-09-13 DIAGNOSIS — E7800 Pure hypercholesterolemia, unspecified: Secondary | ICD-10-CM | POA: Diagnosis not present

## 2019-09-13 DIAGNOSIS — D0502 Lobular carcinoma in situ of left breast: Secondary | ICD-10-CM | POA: Insufficient documentation

## 2019-09-13 DIAGNOSIS — C7B8 Other secondary neuroendocrine tumors: Secondary | ICD-10-CM

## 2019-09-13 DIAGNOSIS — Z79899 Other long term (current) drug therapy: Secondary | ICD-10-CM | POA: Diagnosis not present

## 2019-09-13 DIAGNOSIS — C7A8 Other malignant neuroendocrine tumors: Secondary | ICD-10-CM

## 2019-09-13 DIAGNOSIS — Z170 Estrogen receptor positive status [ER+]: Secondary | ICD-10-CM | POA: Diagnosis not present

## 2019-09-13 DIAGNOSIS — R112 Nausea with vomiting, unspecified: Secondary | ICD-10-CM | POA: Insufficient documentation

## 2019-09-13 DIAGNOSIS — Z79810 Long term (current) use of selective estrogen receptor modulators (SERMs): Secondary | ICD-10-CM | POA: Diagnosis not present

## 2019-09-13 DIAGNOSIS — R197 Diarrhea, unspecified: Secondary | ICD-10-CM | POA: Insufficient documentation

## 2019-09-13 DIAGNOSIS — C7B02 Secondary carcinoid tumors of liver: Secondary | ICD-10-CM | POA: Insufficient documentation

## 2019-09-13 LAB — CBC WITH DIFFERENTIAL (CANCER CENTER ONLY)
Abs Immature Granulocytes: 0.02 10*3/uL (ref 0.00–0.07)
Basophils Absolute: 0.1 10*3/uL (ref 0.0–0.1)
Basophils Relative: 1 %
Eosinophils Absolute: 0.3 10*3/uL (ref 0.0–0.5)
Eosinophils Relative: 4 %
HCT: 36.4 % (ref 36.0–46.0)
Hemoglobin: 12.5 g/dL (ref 12.0–15.0)
Immature Granulocytes: 0 %
Lymphocytes Relative: 24 %
Lymphs Abs: 1.5 10*3/uL (ref 0.7–4.0)
MCH: 28.9 pg (ref 26.0–34.0)
MCHC: 34.3 g/dL (ref 30.0–36.0)
MCV: 84.3 fL (ref 80.0–100.0)
Monocytes Absolute: 0.4 10*3/uL (ref 0.1–1.0)
Monocytes Relative: 7 %
Neutro Abs: 4 10*3/uL (ref 1.7–7.7)
Neutrophils Relative %: 64 %
Platelet Count: 202 10*3/uL (ref 150–400)
RBC: 4.32 MIL/uL (ref 3.87–5.11)
RDW: 12.7 % (ref 11.5–15.5)
WBC Count: 6.3 10*3/uL (ref 4.0–10.5)
nRBC: 0 % (ref 0.0–0.2)

## 2019-09-13 LAB — CMP (CANCER CENTER ONLY)
ALT: 35 U/L (ref 0–44)
AST: 24 U/L (ref 15–41)
Albumin: 3.7 g/dL (ref 3.5–5.0)
Alkaline Phosphatase: 72 U/L (ref 38–126)
Anion gap: 7 (ref 5–15)
BUN: 16 mg/dL (ref 6–20)
CO2: 28 mmol/L (ref 22–32)
Calcium: 10.1 mg/dL (ref 8.9–10.3)
Chloride: 107 mmol/L (ref 98–111)
Creatinine: 0.75 mg/dL (ref 0.44–1.00)
GFR, Est AFR Am: >60 mL/min (ref >60)
GFR, Estimated: >60 mL/min (ref >60)
Glucose, Bld: 128 mg/dL — ABNORMAL HIGH (ref 70–99)
Potassium: 4.2 mmol/L (ref 3.5–5.1)
Sodium: 142 mmol/L (ref 135–145)
Total Bilirubin: 0.4 mg/dL (ref 0.3–1.2)
Total Protein: 6.8 g/dL (ref 6.5–8.1)

## 2019-09-13 MED ORDER — LANREOTIDE ACETATE 120 MG/0.5ML ~~LOC~~ SOLN
120.0000 mg | Freq: Once | SUBCUTANEOUS | Status: AC
  Administered 2019-09-13: 120 mg via SUBCUTANEOUS
  Filled 2019-09-13: qty 120

## 2019-09-13 MED ORDER — PROMETHAZINE HCL 12.5 MG PO TABS: 12.5000 mg | ORAL_TABLET | Freq: Four times a day (QID) | ORAL | 0 refills | Status: DC | PRN

## 2019-09-13 NOTE — Progress Notes (Signed)
[facility] OFFICE PROGRESS NOTE   Diagnosis: Carcinoid tumor  INTERVAL HISTORY:   Ms. Palmisano completed the first treatment with Lutathera on 08/14/2019.  She reports nausea and vomiting beginning on the day of treatment and lasting for approximately 2 days.  She had nausea for 9 days following treatment.  She now feels well.  She had diarrhea on the day of Lutathera, but otherwise no diarrhea.  No hot flashes.  No symptom of thrombosis.  Objective:  Vital signs in last 24 hours:  Blood pressure 118/69, pulse (!) 58, temperature 97.6 F (36.4 C), temperature source Tympanic, resp. rate 17, height 5\' 2"  (1.575 m), weight 173 lb 6.4 oz (78.7 kg), last menstrual period 05/17/2017, SpO2 97 %.   Resp: Lungs clear bilaterally Cardio: Regular rate and rhythm GI: No hepatosplenomegaly, nontender, no mass Vascular: No leg edema     Lab Results:  Lab Results  Component Value Date   WBC 6.3 09/13/2019   HGB 12.5 09/13/2019   HCT 36.4 09/13/2019   MCV 84.3 09/13/2019   PLT 202 09/13/2019   NEUTROABS 4.0 09/13/2019    CMP  Lab Results  Component Value Date   NA 142 09/13/2019   K 4.2 09/13/2019   CL 107 09/13/2019   CO2 28 09/13/2019   GLUCOSE 128 (H) 09/13/2019   BUN 16 09/13/2019   CREATININE 0.75 09/13/2019   CALCIUM 10.1 09/13/2019   PROT 6.8 09/13/2019   ALBUMIN 3.7 09/13/2019   AST 24 09/13/2019   ALT 35 09/13/2019   ALKPHOS 72 09/13/2019   BILITOT 0.4 09/13/2019   GFRNONAA >60 09/13/2019   GFRAA >60 09/13/2019     Imaging:  PCV CARDIAC STRESS TEST  Result Date: 09/10/2019 Treadmill Exercise Stress using Bruce Protocol  09/10/2019: Stress EKG negative for ischemia. Resting ECG demonstrated normal sinus rhythm.  Non-specific T-wave abnormalities. Peak ECG demonstrated no ST-T wave abnormalities. There was pseudonormalization of T inversion in anterolateral leads. The patient exercised for 9 minutes and 6 seconds on Bruce protocol; achieved 10.33  METs at 96% of maximum predicted heart rate. Chest pain is not present. No arrhythmias noted on ECG at stress. The heart rate response was normal.  The blood pressure response was normal. Recommendations: Continue primary prevention.   Medications: I have reviewed the patient's current medications.   Assessment/Plan: 1. Metastatic carcinoid involving liver  05/31/2018 breast MRI-numerous T2 lesions throughout the liver.   06/07/2018 MRI of the liver-numerous hepatic lesions scattered throughout all aspects of the liver.  06/14/2018 PET scan-enlarged and hypermetabolic perihepatic and portacaval lymph nodes; diffuse heterogeneous uptake within the liver with subtle areas of mildly increased FDG uptake corresponding to suspicious lesions identified on MRI.   06/21/2018 biopsy of a right liver lobe mass-low-grade neuroendocrine tumor consistent with metastasis, positive CD56, chromogranin, synaptophysin, CDX-2 and cytokeratin AE1/AE3.   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 Single focus of uptake in the proximal right humerus. No clear primary tumor identified.  Normal chromogranin A 07/05/2018 and normal 24-hour urine 5 HIAA 08/03/2018  Monthly lanreotide beginning 08/03/2018  CT 12/15/2018-small hepatic metastases, mildly improved, slight increase in size of a porta hepatis node  CT abdomen/pelvis 07/02/2019-some of the liver lesions are slightly larger, others stable or smaller, slight increase in splenic lesions, minimal increase in abdominal adenopathy  Cycle 1 Lutathera 08/14/2019 2. Lobular carcinoma in situ right breast, on tamoxifen, followed by Dr.  Gudena 3. Hypercholesterolemia 4. Nausea and vomiting following cycle 1 Lutathera   Disposition: Ms. Finnie appears well today.  She does not have symptoms  of carcinoid syndrome.  She completed one cycle of Lutathera.  The CBC and chemistry panel are unremarkable today.  Lutathera was complicated by acute and delayed nausea.  We gave her prescription for Phenergan to use as needed.  I will contact Dr. Leonia Reeves to discuss adjusting the antiemetic regimen with cycle 2.  She received lanreotide today.  She is scheduled for cycle 2 Lutathera on 10/08/2019.  She will return for an office visit and lanreotide in 8 weeks.  Betsy Coder, MD  09/13/2019  1:31 PM

## 2019-09-13 NOTE — Patient Instructions (Signed)
Lanreotide injection What is this medicine? LANREOTIDE (lan REE oh tide) is used to reduce blood levels of growth hormone in patients with a condition called acromegaly. It also works to slow or stop tumor growth in patients with neuroendocrine tumors and treat carcinoid syndrome. This medicine may be used for other purposes; ask your health care provider or pharmacist if you have questions. COMMON BRAND NAME(S): Somatuline [place] What should I tell my health care provider before I take this medicine? They need to know if you have any of these conditions:  diabetes  gallbladder disease  heart disease  kidney disease  liver disease  thyroid disease  an unusual or allergic reaction to lanreotide, other medicines, foods, dyes, or preservatives  pregnant or trying to get pregnant  breast-feeding How should I use this medicine? This medicine is for injection under the skin. It is given by a health care professional in a hospital or clinic setting. Contact your pediatrician or health care professional regarding the use of this medicine in children. Special care may be needed. Overdosage: If you think you have taken too much of this medicine contact a poison control center or emergency room at once. NOTE: This medicine is only for you. Do not share this medicine with others. What if I miss a dose? It is important not to miss your dose. Call your doctor or health care professional if you are unable to keep an appointment. What may interact with this medicine? This medicine may interact with the following medications:  bromocriptine  cyclosporine  certain medicines for blood pressure, heart disease, irregular heart beat  certain medicines for diabetes  quinidine  terfenadine This list may not describe all possible interactions. Give your health care provider a list of all the medicines, herbs, non-prescription drugs, or dietary supplements you use. Also tell them if you smoke,  drink alcohol, or use illegal drugs. Some items may interact with your medicine. What should I watch for while using this medicine? Tell your doctor or healthcare professional if your symptoms do not start to get better or if they get worse. Visit your doctor or health care professional for regular checks on your progress. Your condition will be monitored carefully while you are receiving this medicine. This medicine may increase blood sugar. Ask your healthcare provider if changes in diet or medicines are needed if you have diabetes. You may need blood work done while you are taking this medicine. Women should inform their doctor if they wish to become pregnant or think they might be pregnant. There is a potential for serious side effects to an unborn child. Talk to your health care professional or pharmacist for more information. Do not breast-feed an infant while taking this medicine or for 6 months after stopping it. This medicine has caused ovarian failure in some women. This medicine may interfere with the ability to have a child. Talk with your doctor or health care professional if you are concerned about your fertility. What side effects may I notice from receiving this medicine? Side effects that you should report to your doctor or health care professional as soon as possible:  allergic reactions like skin rash, itching or hives, swelling of the face, lips, or tongue  increased blood pressure  severe stomach pain  signs and symptoms of hgh blood sugar such as being more thirsty or hungry or having to urinate more than normal. You may also feel very tired or have blurry vision.  signs and symptoms of low blood   sugar such as feeling anxious; confusion; dizziness; increased hunger; unusually weak or tired; sweating; shakiness; cold; irritable; headache; blurred vision; fast heartbeat; loss of consciousness  unusually slow heartbeat Side effects that usually do not require medical  attention (report to your doctor or health care professional if they continue or are bothersome):  constipation  diarrhea  dizziness  headache  muscle pain  muscle spasms  nausea  pain, redness, or irritation at site where injected This list may not describe all possible side effects. Call your doctor for medical advice about side effects. You may report side effects to FDA at 1-800-FDA-1088. Where should I keep my medicine? This drug is given in a hospital or clinic and will not be stored at home. NOTE: This sheet is a summary. It may not cover all possible information. If you have questions about this medicine, talk to your doctor, pharmacist, or health care provider.  2020 Elsevier/Gold Standard (2017-10-06 09:13:08)  

## 2019-09-14 ENCOUNTER — Telehealth: Payer: Self-pay | Admitting: Oncology

## 2019-09-14 ENCOUNTER — Ambulatory Visit: Payer: No Typology Code available for payment source

## 2019-09-14 NOTE — Telephone Encounter (Signed)
Scheduled appointments per 9/2 los. Left message for patient with appointment changes.

## 2019-10-08 ENCOUNTER — Ambulatory Visit (HOSPITAL_COMMUNITY)
Admission: RE | Admit: 2019-10-08 | Discharge: 2019-10-08 | Disposition: A | Discharge status: Home / Self Care | Payer: No Typology Code available for payment source | Source: Ambulatory Visit | Attending: Oncology | Admitting: Oncology

## 2019-10-08 ENCOUNTER — Other Ambulatory Visit: Payer: Self-pay

## 2019-10-08 DIAGNOSIS — C7B8 Other secondary neuroendocrine tumors: Secondary | ICD-10-CM | POA: Diagnosis present

## 2019-10-08 DIAGNOSIS — C7A8 Other malignant neuroendocrine tumors: Secondary | ICD-10-CM | POA: Diagnosis present

## 2019-10-08 LAB — CBC WITH DIFFERENTIAL/PLATELET
Abs Immature Granulocytes: 0.01 10*3/uL (ref 0.00–0.07)
Basophils Absolute: 0 10*3/uL (ref 0.0–0.1)
Basophils Relative: 1 %
Eosinophils Absolute: 0.3 10*3/uL (ref 0.0–0.5)
Eosinophils Relative: 5 %
HCT: 36.2 % (ref 36.0–46.0)
Hemoglobin: 12.4 g/dL (ref 12.0–15.0)
Immature Granulocytes: 0 %
Lymphocytes Relative: 26 %
Lymphs Abs: 1.5 10*3/uL (ref 0.7–4.0)
MCH: 29.7 pg (ref 26.0–34.0)
MCHC: 34.3 g/dL (ref 30.0–36.0)
MCV: 86.6 fL (ref 80.0–100.0)
Monocytes Absolute: 0.4 10*3/uL (ref 0.1–1.0)
Monocytes Relative: 8 %
Neutro Abs: 3.5 10*3/uL (ref 1.7–7.7)
Neutrophils Relative %: 60 %
Platelets: 240 10*3/uL (ref 150–400)
RBC: 4.18 MIL/uL (ref 3.87–5.11)
RDW: 14.2 % (ref 11.5–15.5)
WBC: 5.7 10*3/uL (ref 4.0–10.5)
nRBC: 0 % (ref 0.0–0.2)

## 2019-10-08 LAB — COMPREHENSIVE METABOLIC PANEL
ALT: 32 U/L (ref 0–44)
AST: 31 U/L (ref 15–41)
Albumin: 3.9 g/dL (ref 3.5–5.0)
Alkaline Phosphatase: 62 U/L (ref 38–126)
Anion gap: 12 (ref 5–15)
BUN: 14 mg/dL (ref 6–20)
CO2: 25 mmol/L (ref 22–32)
Calcium: 9.1 mg/dL (ref 8.9–10.3)
Chloride: 107 mmol/L (ref 98–111)
Creatinine, Ser: 0.73 mg/dL (ref 0.44–1.00)
GFR calc Af Amer: >60 mL/min (ref >60)
GFR calc non Af Amer: >60 mL/min (ref >60)
Glucose, Bld: 116 mg/dL — ABNORMAL HIGH (ref 70–99)
Potassium: 3.9 mmol/L (ref 3.5–5.1)
Sodium: 144 mmol/L (ref 135–145)
Total Bilirubin: 0.4 mg/dL (ref 0.3–1.2)
Total Protein: 7.1 g/dL (ref 6.5–8.1)

## 2019-10-08 MED ORDER — LANREOTIDE ACETATE 120 MG/0.5ML ~~LOC~~ SOLN
120.0000 mg | Freq: Once | SUBCUTANEOUS | Status: AC
  Administered 2019-10-08: 120 mg via SUBCUTANEOUS

## 2019-10-08 MED ORDER — LUTETIUM LU 177 DOTATATE 370 MBQ/ML IV SOLN
200.0000 | Freq: Once | INTRAVENOUS | Status: AC
  Administered 2019-10-08: 198.2 via INTRAVENOUS

## 2019-10-08 MED ORDER — ONDANSETRON HCL 8 MG PO TABS: 8.0000 mg | ORAL_TABLET | Freq: Two times a day (BID) | ORAL | 0 refills | Status: DC | PRN

## 2019-10-08 MED ORDER — PROMETHAZINE HCL 25 MG/ML IJ SOLN
12.5000 mg | Freq: Four times a day (QID) | INTRAMUSCULAR | Status: AC | PRN
  Administered 2019-10-08: 12.5 mg via INTRAVENOUS

## 2019-10-08 MED ORDER — SODIUM CHLORIDE 0.9 % IV SOLN
500.0000 mL | Freq: Once | INTRAVENOUS | Status: AC
  Administered 2019-10-08: 500 mL via INTRAVENOUS

## 2019-10-08 MED ORDER — OCTREOTIDE ACETATE 500 MCG/ML IJ SOLN
500.0000 ug | Freq: Once | INTRAMUSCULAR | Status: DC | PRN
  Filled 2019-10-08 (×2): qty 1

## 2019-10-08 MED ORDER — PROMETHAZINE HCL 25 MG/ML IJ SOLN
INTRAMUSCULAR | Status: AC
  Filled 2019-10-08: qty 1

## 2019-10-08 MED ORDER — PROCHLORPERAZINE EDISYLATE 10 MG/2ML IJ SOLN
10.0000 mg | Freq: Four times a day (QID) | INTRAMUSCULAR | Status: DC | PRN
  Administered 2019-10-08: 10 mg via INTRAVENOUS
  Filled 2019-10-08: qty 2

## 2019-10-08 MED ORDER — LANREOTIDE ACETATE 120 MG/0.5ML ~~LOC~~ SOLN
SUBCUTANEOUS | Status: AC
  Filled 2019-10-08: qty 120

## 2019-10-08 MED ORDER — AMINO ACID RADIOPROTECTANT - L-LYSINE 2.5%/L-ARGININE 2.5% IN NS
250.0000 mL/h | INTRAVENOUS | Status: AC
  Administered 2019-10-08: 250 mL/h via INTRAVENOUS
  Filled 2019-10-08: qty 1000

## 2019-10-08 MED ORDER — OCTREOTIDE ACETATE 30 MG IM KIT: 30.0000 mg | PACK | Freq: Once | INTRAMUSCULAR | Status: DC

## 2019-10-08 MED ORDER — SODIUM CHLORIDE 0.9 % IV SOLN
8.0000 mg | Freq: Once | INTRAVENOUS | Status: AC
  Administered 2019-10-08: 8 mg via INTRAVENOUS
  Filled 2019-10-08: qty 4

## 2019-10-08 NOTE — Progress Notes (Signed)
Pt still with nausea and vomiting.  Spoke with Dr. Leonia Reeves, new order for phenergan received. Medication given

## 2019-10-08 NOTE — Progress Notes (Signed)
Pt reports increase in nausea.  Compazine given

## 2019-10-09 ENCOUNTER — Ambulatory Visit (HOSPITAL_COMMUNITY): Payer: PRIVATE HEALTH INSURANCE

## 2019-10-12 ENCOUNTER — Encounter: Payer: Self-pay | Admitting: Oncology

## 2019-10-12 NOTE — Progress Notes (Signed)
Pt was re enrolled w/ [employer] for [employer] for 1 year from 10/12/19 for $20,000.  Pt will pay $0 out of pocket until she hits her annual out of pocket maximum.

## 2019-10-15 ENCOUNTER — Ambulatory Visit: Payer: No Typology Code available for payment source | Admitting: Nurse Practitioner

## 2019-10-15 ENCOUNTER — Ambulatory Visit: Payer: No Typology Code available for payment source

## 2019-10-23 ENCOUNTER — Encounter: Payer: Self-pay | Admitting: Oncology

## 2019-11-05 ENCOUNTER — Inpatient Hospital Stay (HOSPITAL_BASED_OUTPATIENT_CLINIC_OR_DEPARTMENT_OTHER): Payer: No Typology Code available for payment source | Admitting: Nurse Practitioner

## 2019-11-05 ENCOUNTER — Inpatient Hospital Stay: Payer: No Typology Code available for payment source

## 2019-11-05 ENCOUNTER — Inpatient Hospital Stay: Payer: No Typology Code available for payment source | Attending: Hematology and Oncology

## 2019-11-05 ENCOUNTER — Other Ambulatory Visit: Payer: Self-pay

## 2019-11-05 ENCOUNTER — Encounter: Payer: Self-pay | Admitting: Nurse Practitioner

## 2019-11-05 VITALS — BP 118/61 | HR 60 | Temp 98.4°F | Resp 18

## 2019-11-05 VITALS — BP 118/74 | HR 61 | Temp 97.2°F | Resp 18 | Ht 62.0 in | Wt 171.5 lb

## 2019-11-05 DIAGNOSIS — Z79810 Long term (current) use of selective estrogen receptor modulators (SERMs): Secondary | ICD-10-CM | POA: Insufficient documentation

## 2019-11-05 DIAGNOSIS — E7800 Pure hypercholesterolemia, unspecified: Secondary | ICD-10-CM | POA: Insufficient documentation

## 2019-11-05 DIAGNOSIS — Z79899 Other long term (current) drug therapy: Secondary | ICD-10-CM | POA: Diagnosis not present

## 2019-11-05 DIAGNOSIS — C7A8 Other malignant neuroendocrine tumors: Secondary | ICD-10-CM | POA: Insufficient documentation

## 2019-11-05 DIAGNOSIS — C7B8 Other secondary neuroendocrine tumors: Secondary | ICD-10-CM | POA: Insufficient documentation

## 2019-11-05 DIAGNOSIS — D0501 Lobular carcinoma in situ of right breast: Secondary | ICD-10-CM | POA: Diagnosis not present

## 2019-11-05 DIAGNOSIS — E340 Carcinoid syndrome: Secondary | ICD-10-CM | POA: Insufficient documentation

## 2019-11-05 LAB — CMP (CANCER CENTER ONLY)
ALT: 35 U/L (ref 0–44)
AST: 23 U/L (ref 15–41)
Albumin: 4.1 g/dL (ref 3.5–5.0)
Alkaline Phosphatase: 69 U/L (ref 38–126)
Anion gap: 6 (ref 5–15)
BUN: 18 mg/dL (ref 6–20)
CO2: 28 mmol/L (ref 22–32)
Calcium: 9.7 mg/dL (ref 8.9–10.3)
Chloride: 106 mmol/L (ref 98–111)
Creatinine: 0.81 mg/dL (ref 0.44–1.00)
GFR, Estimated: >60 mL/min (ref >60)
Glucose, Bld: 116 mg/dL — ABNORMAL HIGH (ref 70–99)
Potassium: 4.8 mmol/L (ref 3.5–5.1)
Sodium: 140 mmol/L (ref 135–145)
Total Bilirubin: 0.4 mg/dL (ref 0.3–1.2)
Total Protein: 7.2 g/dL (ref 6.5–8.1)

## 2019-11-05 LAB — CBC WITH DIFFERENTIAL (CANCER CENTER ONLY)
Abs Immature Granulocytes: 0.01 10*3/uL (ref 0.00–0.07)
Basophils Absolute: 0 10*3/uL (ref 0.0–0.1)
Basophils Relative: 1 %
Eosinophils Absolute: 0.3 10*3/uL (ref 0.0–0.5)
Eosinophils Relative: 4 %
HCT: 38.4 % (ref 36.0–46.0)
Hemoglobin: 13.1 g/dL (ref 12.0–15.0)
Immature Granulocytes: 0 %
Lymphocytes Relative: 20 %
Lymphs Abs: 1.3 10*3/uL (ref 0.7–4.0)
MCH: 29 pg (ref 26.0–34.0)
MCHC: 34.1 g/dL (ref 30.0–36.0)
MCV: 85.1 fL (ref 80.0–100.0)
Monocytes Absolute: 0.6 10*3/uL (ref 0.1–1.0)
Monocytes Relative: 9 %
Neutro Abs: 4.2 10*3/uL (ref 1.7–7.7)
Neutrophils Relative %: 66 %
Platelet Count: 176 10*3/uL (ref 150–400)
RBC: 4.51 MIL/uL (ref 3.87–5.11)
RDW: 13.5 % (ref 11.5–15.5)
WBC Count: 6.4 10*3/uL (ref 4.0–10.5)
nRBC: 0 % (ref 0.0–0.2)

## 2019-11-05 MED ORDER — DEXAMETHASONE 4 MG PO TABS: 4.0000 mg | ORAL_TABLET | Freq: Two times a day (BID) | ORAL | 0 refills | Status: AC

## 2019-11-05 MED ORDER — LANREOTIDE ACETATE 120 MG/0.5ML ~~LOC~~ SOLN
120.0000 mg | Freq: Once | SUBCUTANEOUS | Status: AC
  Administered 2019-11-05: 120 mg via SUBCUTANEOUS
  Filled 2019-11-05: qty 120

## 2019-11-05 NOTE — Progress Notes (Signed)
  Sally Brown OFFICE PROGRESS NOTE   Diagnosis:  Carcinoid tumor  INTERVAL HISTORY:   Sally Brown returns as scheduled.  She completed cycle 2 Lutathera 10/08/2019.  She had nausea/vomiting/diarrhea day of treatment.  On day 4 she developed nausea/vomiting which persisted for about 9 days.  No improvement with Zofran, Compazine, Phenergan.  She is able to eat and drink.  Objective:  Vital signs in last 24 hours:  Blood pressure 118/74, pulse 61, temperature (!) 97.2 F (36.2 C), temperature source Tympanic, resp. rate 18, height 5\' 2"  (1.575 m), weight 171 lb 8 oz (77.8 kg), last menstrual period 05/17/2017, SpO2 99 %.    HEENT: No thrush or ulcers. Resp: Lungs clear bilaterally. Cardio: Regular rate and rhythm. GI: Abdomen soft and nontender.  No hepatomegaly. Vascular: No leg edema.   Lab Results:  Lab Results  Component Value Date   WBC 6.4 11/05/2019   HGB 13.1 11/05/2019   HCT 38.4 11/05/2019   MCV 85.1 11/05/2019   PLT 176 11/05/2019   NEUTROABS 4.2 11/05/2019    Imaging:  No results found.  Medications: I have reviewed the patient's current medications.  Assessment/Plan: 1. Metastatic carcinoid involving liver  05/31/2018 breast MRI-numerous T2 lesions throughout the liver.   06/07/2018 MRI of the liver-numerous hepatic lesions scattered throughout all aspects of the liver.  06/14/2018 PET scan-enlarged and hypermetabolic perihepatic and portacaval lymph nodes; diffuse heterogeneous uptake within the liver with subtle areas of mildly increased FDG uptake corresponding to suspicious lesions identified on MRI.   06/21/2018 biopsy of a right liver lobe mass-low-grade neuroendocrine tumor consistent with metastasis, positive CD56, chromogranin, synaptophysin, CDX-2 and cytokeratin AE1/AE3.   07/07/2018 Netspot-well-differentiated neuroendocrine tumor with metastasis to multiple periportal and peripancreatic lymph nodes, multiple small bilobar  hepatic metastasis, solitary middle mediastinal nodal metastasis. Single focus of uptake in the proximal right humerus. No clear primary tumor identified.  Normal chromogranin A 07/05/2018 and normal 24-hour urine 5 HIAA 08/03/2018  Monthly lanreotide beginning 08/03/2018  CT 12/15/2018-small hepatic metastases, mildly improved, slight increase in size of a porta hepatis node  CT abdomen/pelvis 07/02/2019-some of the liver lesions are slightly larger, others stable or smaller, slight increase in splenic lesions, minimal increase in abdominal adenopathy  Cycle 1 Lutathera 08/14/2019  Cycle 2 Lutathera 10/08/2019 2. Lobular carcinoma in situ right breast, on tamoxifen, followed by Dr. Lindi Adie 3. Hypercholesterolemia 4. Nausea and vomiting following cycle 1 Lutathera  Disposition: Sally Brown appears stable. She has completed 2 treatments with Lutathera.  Both treatments were complicated by acute and delayed nausea/vomiting.  She tried multiple antiemetics without relief.  She will begin Decadron 4 mg twice daily for 3 days beginning day 3 after the next Lutathera.  She will contact the office if this is not effective.  She will receive lanreotide today.  She is also receiving the Covid booster today.  She will return for lab, follow-up and lanreotide in 8 weeks.  Plan reviewed with Dr. Benay Spice.  Ned Card ANP/GNP-BC   11/05/2019  10:55 AM

## 2019-11-06 ENCOUNTER — Telehealth: Payer: Self-pay | Admitting: Nurse Practitioner

## 2019-11-06 NOTE — Telephone Encounter (Signed)
Scheduled appointments per 10/25 los. Spoke to patient who is aware of appointment date and time.

## 2019-11-21 ENCOUNTER — Other Ambulatory Visit: Payer: Self-pay

## 2019-11-21 ENCOUNTER — Ambulatory Visit
Admission: RE | Admit: 2019-11-21 | Discharge: 2019-11-21 | Disposition: A | Discharge status: Home / Self Care | Payer: PRIVATE HEALTH INSURANCE | Source: Ambulatory Visit | Attending: Hematology and Oncology | Admitting: Hematology and Oncology

## 2019-11-21 DIAGNOSIS — D0501 Lobular carcinoma in situ of right breast: Secondary | ICD-10-CM

## 2019-11-21 MED ORDER — GADOBUTROL 1 MMOL/ML IV SOLN
8.0000 mL | Freq: Once | INTRAVENOUS | Status: AC | PRN
  Administered 2019-11-21: 8 mL via INTRAVENOUS

## 2019-12-03 ENCOUNTER — Other Ambulatory Visit: Payer: Self-pay

## 2019-12-03 ENCOUNTER — Ambulatory Visit (HOSPITAL_COMMUNITY)
Admission: RE | Admit: 2019-12-03 | Discharge: 2019-12-03 | Disposition: A | Discharge status: Home / Self Care | Payer: No Typology Code available for payment source | Source: Ambulatory Visit | Attending: Oncology | Admitting: Oncology

## 2019-12-03 DIAGNOSIS — C7B8 Other secondary neuroendocrine tumors: Secondary | ICD-10-CM | POA: Diagnosis present

## 2019-12-03 DIAGNOSIS — C7A8 Other malignant neuroendocrine tumors: Secondary | ICD-10-CM | POA: Diagnosis present

## 2019-12-03 LAB — CBC WITH DIFFERENTIAL/PLATELET
Abs Immature Granulocytes: 0.01 10*3/uL (ref 0.00–0.07)
Basophils Absolute: 0 10*3/uL (ref 0.0–0.1)
Basophils Relative: 0 %
Eosinophils Absolute: 0.1 10*3/uL (ref 0.0–0.5)
Eosinophils Relative: 3 %
HCT: 35.1 % — ABNORMAL LOW (ref 36.0–46.0)
Hemoglobin: 12.1 g/dL (ref 12.0–15.0)
Immature Granulocytes: 0 %
Lymphocytes Relative: 23 %
Lymphs Abs: 1.1 10*3/uL (ref 0.7–4.0)
MCH: 30.6 pg (ref 26.0–34.0)
MCHC: 34.5 g/dL (ref 30.0–36.0)
MCV: 88.6 fL (ref 80.0–100.0)
Monocytes Absolute: 0.5 10*3/uL (ref 0.1–1.0)
Monocytes Relative: 10 %
Neutro Abs: 3 10*3/uL (ref 1.7–7.7)
Neutrophils Relative %: 64 %
Platelets: 210 10*3/uL (ref 150–400)
RBC: 3.96 MIL/uL (ref 3.87–5.11)
RDW: 13.9 % (ref 11.5–15.5)
WBC: 4.7 10*3/uL (ref 4.0–10.5)
nRBC: 0 % (ref 0.0–0.2)

## 2019-12-03 LAB — COMPREHENSIVE METABOLIC PANEL
ALT: 32 U/L (ref 0–44)
AST: 27 U/L (ref 15–41)
Albumin: 4.2 g/dL (ref 3.5–5.0)
Alkaline Phosphatase: 57 U/L (ref 38–126)
Anion gap: 10 (ref 5–15)
BUN: 16 mg/dL (ref 6–20)
CO2: 25 mmol/L (ref 22–32)
Calcium: 9.1 mg/dL (ref 8.9–10.3)
Chloride: 107 mmol/L (ref 98–111)
Creatinine, Ser: 0.7 mg/dL (ref 0.44–1.00)
GFR, Estimated: >60 mL/min (ref >60)
Glucose, Bld: 124 mg/dL — ABNORMAL HIGH (ref 70–99)
Potassium: 3.9 mmol/L (ref 3.5–5.1)
Sodium: 142 mmol/L (ref 135–145)
Total Bilirubin: 0.6 mg/dL (ref 0.3–1.2)
Total Protein: 7.1 g/dL (ref 6.5–8.1)

## 2019-12-03 MED ORDER — OCTREOTIDE ACETATE 500 MCG/ML IJ SOLN: 500.0000 ug | Freq: Once | INTRAMUSCULAR | Status: DC | PRN

## 2019-12-03 MED ORDER — LANREOTIDE ACETATE 120 MG/0.5ML ~~LOC~~ SOLN
SUBCUTANEOUS | Status: AC
  Administered 2019-12-03: 120 mg via SUBCUTANEOUS
  Filled 2019-12-03: qty 120

## 2019-12-03 MED ORDER — PROCHLORPERAZINE EDISYLATE 10 MG/2ML IJ SOLN
INTRAMUSCULAR | Status: AC
  Administered 2019-12-03: 10 mg via INTRAVENOUS
  Filled 2019-12-03: qty 2

## 2019-12-03 MED ORDER — LANREOTIDE ACETATE 120 MG/0.5ML ~~LOC~~ SOLN: 120.0000 mg | Freq: Once | SUBCUTANEOUS | Status: AC

## 2019-12-03 MED ORDER — PROCHLORPERAZINE EDISYLATE 10 MG/2ML IJ SOLN: 10.0000 mg | Freq: Four times a day (QID) | INTRAMUSCULAR | Status: DC | PRN

## 2019-12-03 MED ORDER — AMINO ACID RADIOPROTECTANT - L-LYSINE 2.5%/L-ARGININE 2.5% IN NS
250.0000 mL/h | INTRAVENOUS | Status: AC
  Administered 2019-12-03: 250 mL/h via INTRAVENOUS
  Filled 2019-12-03: qty 1000

## 2019-12-03 MED ORDER — SODIUM CHLORIDE 0.9 % IV SOLN: 500.0000 mL | Freq: Once | INTRAVENOUS | Status: DC

## 2019-12-03 MED ORDER — LANREOTIDE ACETATE 120 MG/0.5ML ~~LOC~~ SOLN
SUBCUTANEOUS | Status: AC
  Filled 2019-12-03: qty 120

## 2019-12-03 MED ORDER — OCTREOTIDE ACETATE 30 MG IM KIT: 30.0000 mg | PACK | Freq: Once | INTRAMUSCULAR | Status: DC

## 2019-12-03 MED ORDER — SODIUM CHLORIDE 0.9 % IV SOLN
8.0000 mg | Freq: Once | INTRAVENOUS | Status: AC
  Administered 2019-12-03: 8 mg via INTRAVENOUS
  Filled 2019-12-03: qty 4

## 2019-12-03 MED ORDER — LUTETIUM LU 177 DOTATATE 370 MBQ/ML IV SOLN
200.0000 | Freq: Once | INTRAVENOUS | Status: AC
  Administered 2019-12-03: 200 via INTRAVENOUS

## 2019-12-03 NOTE — Progress Notes (Signed)
Patient had x1 episodo of vomiting, denies diarrhea. Dr. Clovis Riley made aware and Compazine 10mg  administered.  Patient tolerating well.  Patient given Ginger Ale to help settle her stomach.  Spoke with patient and will leave IVs in until Lanreotide is administered and up for discharge.

## 2019-12-31 ENCOUNTER — Other Ambulatory Visit: Payer: Self-pay

## 2019-12-31 ENCOUNTER — Inpatient Hospital Stay: Payer: No Typology Code available for payment source | Attending: Hematology and Oncology

## 2019-12-31 ENCOUNTER — Inpatient Hospital Stay: Payer: No Typology Code available for payment source

## 2019-12-31 ENCOUNTER — Inpatient Hospital Stay (HOSPITAL_BASED_OUTPATIENT_CLINIC_OR_DEPARTMENT_OTHER): Payer: No Typology Code available for payment source | Admitting: Oncology

## 2019-12-31 VITALS — BP 106/71 | HR 94 | Temp 97.8°F | Resp 20 | Ht 62.0 in | Wt 176.1 lb

## 2019-12-31 DIAGNOSIS — D0502 Lobular carcinoma in situ of left breast: Secondary | ICD-10-CM | POA: Diagnosis not present

## 2019-12-31 DIAGNOSIS — R112 Nausea with vomiting, unspecified: Secondary | ICD-10-CM | POA: Diagnosis not present

## 2019-12-31 DIAGNOSIS — Z23 Encounter for immunization: Secondary | ICD-10-CM | POA: Insufficient documentation

## 2019-12-31 DIAGNOSIS — C7A8 Other malignant neuroendocrine tumors: Secondary | ICD-10-CM

## 2019-12-31 DIAGNOSIS — E7800 Pure hypercholesterolemia, unspecified: Secondary | ICD-10-CM | POA: Insufficient documentation

## 2019-12-31 DIAGNOSIS — Z79810 Long term (current) use of selective estrogen receptor modulators (SERMs): Secondary | ICD-10-CM | POA: Diagnosis not present

## 2019-12-31 DIAGNOSIS — C7B8 Other secondary neuroendocrine tumors: Secondary | ICD-10-CM | POA: Insufficient documentation

## 2019-12-31 DIAGNOSIS — Z79899 Other long term (current) drug therapy: Secondary | ICD-10-CM | POA: Diagnosis not present

## 2019-12-31 LAB — CBC WITH DIFFERENTIAL (CANCER CENTER ONLY)
Abs Immature Granulocytes: 0.01 10*3/uL (ref 0.00–0.07)
Basophils Absolute: 0 10*3/uL (ref 0.0–0.1)
Basophils Relative: 1 %
Eosinophils Absolute: 0.3 10*3/uL (ref 0.0–0.5)
Eosinophils Relative: 6 %
HCT: 35.9 % — ABNORMAL LOW (ref 36.0–46.0)
Hemoglobin: 12.1 g/dL (ref 12.0–15.0)
Immature Granulocytes: 0 %
Lymphocytes Relative: 24 %
Lymphs Abs: 1 10*3/uL (ref 0.7–4.0)
MCH: 30.6 pg (ref 26.0–34.0)
MCHC: 33.7 g/dL (ref 30.0–36.0)
MCV: 90.9 fL (ref 80.0–100.0)
Monocytes Absolute: 0.7 10*3/uL (ref 0.1–1.0)
Monocytes Relative: 17 %
Neutro Abs: 2.2 10*3/uL (ref 1.7–7.7)
Neutrophils Relative %: 52 %
Platelet Count: 176 10*3/uL (ref 150–400)
RBC: 3.95 MIL/uL (ref 3.87–5.11)
RDW: 13.5 % (ref 11.5–15.5)
WBC Count: 4.2 10*3/uL (ref 4.0–10.5)
nRBC: 0 % (ref 0.0–0.2)

## 2019-12-31 LAB — CMP (CANCER CENTER ONLY)
ALT: 29 U/L (ref 0–44)
AST: 22 U/L (ref 15–41)
Albumin: 3.7 g/dL (ref 3.5–5.0)
Alkaline Phosphatase: 66 U/L (ref 38–126)
Anion gap: 5 (ref 5–15)
BUN: 18 mg/dL (ref 6–20)
CO2: 27 mmol/L (ref 22–32)
Calcium: 9.5 mg/dL (ref 8.9–10.3)
Chloride: 110 mmol/L (ref 98–111)
Creatinine: 0.8 mg/dL (ref 0.44–1.00)
GFR, Estimated: >60 mL/min (ref >60)
Glucose, Bld: 120 mg/dL — ABNORMAL HIGH (ref 70–99)
Potassium: 5.2 mmol/L — ABNORMAL HIGH (ref 3.5–5.1)
Sodium: 142 mmol/L (ref 135–145)
Total Bilirubin: 0.5 mg/dL (ref 0.3–1.2)
Total Protein: 6.9 g/dL (ref 6.5–8.1)

## 2019-12-31 MED ORDER — LANREOTIDE ACETATE 120 MG/0.5ML ~~LOC~~ SOLN
120.0000 mg | Freq: Once | SUBCUTANEOUS | Status: AC
  Administered 2019-12-31: 13:00:00 120 mg via SUBCUTANEOUS
  Filled 2019-12-31: qty 120

## 2019-12-31 NOTE — Patient Instructions (Signed)
Lanreotide injection What is this medicine? LANREOTIDE (lan REE oh tide) is used to reduce blood levels of growth hormone in patients with a condition called acromegaly. It also works to slow or stop tumor growth in patients with neuroendocrine tumors and treat carcinoid syndrome. This medicine may be used for other purposes; ask your health care provider or pharmacist if you have questions. COMMON BRAND NAME(S): Somatuline [place] What should I tell my health care provider before I take this medicine? They need to know if you have any of these conditions:  diabetes  gallbladder disease  heart disease  kidney disease  liver disease  thyroid disease  an unusual or allergic reaction to lanreotide, other medicines, foods, dyes, or preservatives  pregnant or trying to get pregnant  breast-feeding How should I use this medicine? This medicine is for injection under the skin. It is given by a health care professional in a hospital or clinic setting. Contact your pediatrician or health care professional regarding the use of this medicine in children. Special care may be needed. Overdosage: If you think you have taken too much of this medicine contact a poison control center or emergency room at once. NOTE: This medicine is only for you. Do not share this medicine with others. What if I miss a dose? It is important not to miss your dose. Call your doctor or health care professional if you are unable to keep an appointment. What may interact with this medicine? This medicine may interact with the following medications:  bromocriptine  cyclosporine  certain medicines for blood pressure, heart disease, irregular heart beat  certain medicines for diabetes  quinidine  terfenadine This list may not describe all possible interactions. Give your health care provider a list of all the medicines, herbs, non-prescription drugs, or dietary supplements you use. Also tell them if you smoke,  drink alcohol, or use illegal drugs. Some items may interact with your medicine. What should I watch for while using this medicine? Tell your doctor or healthcare professional if your symptoms do not start to get better or if they get worse. Visit your doctor or health care professional for regular checks on your progress. Your condition will be monitored carefully while you are receiving this medicine. This medicine may increase blood sugar. Ask your healthcare provider if changes in diet or medicines are needed if you have diabetes. You may need blood work done while you are taking this medicine. Women should inform their doctor if they wish to become pregnant or think they might be pregnant. There is a potential for serious side effects to an unborn child. Talk to your health care professional or pharmacist for more information. Do not breast-feed an infant while taking this medicine or for 6 months after stopping it. This medicine has caused ovarian failure in some women. This medicine may interfere with the ability to have a child. Talk with your doctor or health care professional if you are concerned about your fertility. What side effects may I notice from receiving this medicine? Side effects that you should report to your doctor or health care professional as soon as possible:  allergic reactions like skin rash, itching or hives, swelling of the face, lips, or tongue  increased blood pressure  severe stomach pain  signs and symptoms of hgh blood sugar such as being more thirsty or hungry or having to urinate more than normal. You may also feel very tired or have blurry vision.  signs and symptoms of low blood   sugar such as feeling anxious; confusion; dizziness; increased hunger; unusually weak or tired; sweating; shakiness; cold; irritable; headache; blurred vision; fast heartbeat; loss of consciousness  unusually slow heartbeat Side effects that usually do not require medical  attention (report to your doctor or health care professional if they continue or are bothersome):  constipation  diarrhea  dizziness  headache  muscle pain  muscle spasms  nausea  pain, redness, or irritation at site where injected This list may not describe all possible side effects. Call your doctor for medical advice about side effects. You may report side effects to FDA at 1-800-FDA-1088. Where should I keep my medicine? This drug is given in a hospital or clinic and will not be stored at home. NOTE: This sheet is a summary. It may not cover all possible information. If you have questions about this medicine, talk to your doctor, pharmacist, or health care provider.  2020 Elsevier/Gold Standard (2017-10-06 09:13:08)  

## 2019-12-31 NOTE — Progress Notes (Signed)
[facility] OFFICE PROGRESS NOTE   Diagnosis: Carcinoid tumor  INTERVAL HISTORY:   Ms. Mallery completed another treatment with Lutathera on 12/03/2019.  She continues monthly lanreotide.  She had an episode of nausea and vomiting immediately after the Lutathera infusion.  She did not have nausea for 3 days following the infusion while taking steroid prophylaxis.  She then had nausea for several additional days.  No diarrhea or flushing.  She feels well.  She is exercising.  Objective:  Vital signs in last 24 hours:  Blood pressure 106/71, pulse 94, temperature 97.8 F (36.6 C), temperature source Tympanic, resp. rate 20, height 5\' 2"  (1.575 m), weight 176 lb 1.6 oz (79.9 kg), last menstrual period 05/17/2017, SpO2 99 %.    Resp: Lungs clear bilaterally Cardio: Regular rate and rhythm GI: No hepatosplenomegaly, no mass, nontender Vascular: No leg edema  Lab Results:  Lab Results  Component Value Date   WBC 4.2 12/31/2019   HGB 12.1 12/31/2019   HCT 35.9 (L) 12/31/2019   MCV 90.9 12/31/2019   PLT 176 12/31/2019   NEUTROABS 2.2 12/31/2019    CMP  Lab Results  Component Value Date   NA 142 12/31/2019   K 5.2 (H) 12/31/2019   CL 110 12/31/2019   CO2 27 12/31/2019   GLUCOSE 120 (H) 12/31/2019   BUN 18 12/31/2019   CREATININE 0.80 12/31/2019   CALCIUM 9.5 12/31/2019   PROT 6.9 12/31/2019   ALBUMIN 3.7 12/31/2019   AST 22 12/31/2019   ALT 29 12/31/2019   ALKPHOS 66 12/31/2019   BILITOT 0.5 12/31/2019   GFRNONAA >60 12/31/2019   GFRAA >60 10/08/2019    No results found for: CEA1  Lab Results  Component Value Date   INR 1.1 06/21/2018    Imaging:  No results found.  Medications: I have reviewed the patient's current medications.   Assessment/Plan: 1. Metastatic carcinoid involving liver  05/31/2018 breast MRI-numerous T2 lesions throughout the liver.   06/07/2018 MRI of the liver-numerous hepatic lesions scattered throughout all aspects  of the liver.  06/14/2018 PET scan-enlarged and hypermetabolic perihepatic and portacaval lymph nodes; diffuse heterogeneous uptake within the liver with subtle areas of mildly increased FDG uptake corresponding to suspicious lesions identified on MRI.   06/21/2018 biopsy of a right liver lobe mass-low-grade neuroendocrine tumor consistent with metastasis, positive CD56, chromogranin, synaptophysin, CDX-2 and cytokeratin AE1/AE3.   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 Single focus of uptake in the proximal right humerus. No clear primary tumor identified.  Normal chromogranin A 07/05/2018 and normal 24-hour urine 5 HIAA 08/03/2018  Monthly lanreotide beginning 08/03/2018  CT 12/15/2018-small hepatic metastases, mildly improved, slight increase in size of a porta hepatis node  CT abdomen/pelvis 07/02/2019-some of the liver lesions are slightly larger, others stable or smaller, slight increase in splenic lesions, minimal increase in abdominal adenopathy  Cycle 1 Lutathera 08/14/2019  Cycle 2 Lutathera 10/08/2019  Cycle 3 Lutathera 12/03/2019 2. Lobular carcinoma in situ right breast 05/29/2018, on tamoxifen, followed by Dr. Lindi Adie 3. Hypercholesterolemia 4. Nausea and vomiting following cycle 1 Lutathera    Disposition: Sally Brown appears stable.  She has completed 3 treatments with Lutathera.  She continues monthly lanreotide.  She has developed nausea and vomiting following each treatment with Lutathera despite Zofran prophylaxis.  We will recommend Decadron prophylaxis prior to and following the next cycle.  She is scheduled for  the next Lutathera on 01/29/2020.  She will return for an office visit and chromogranin a level on 02/26/2020.  Betsy Coder, MD  12/31/2019  12:41 PM

## 2020-01-02 ENCOUNTER — Other Ambulatory Visit: Payer: Self-pay | Admitting: *Deleted

## 2020-01-02 MED ORDER — DEXAMETHASONE 4 MG PO TABS: 4.0000 mg | ORAL_TABLET | Freq: Two times a day (BID) | ORAL | 0 refills | Status: AC

## 2020-01-02 NOTE — Progress Notes (Signed)
Per Dr. Benay Spice: needs script for Decadron 4 mg bid X 3 days after Luthera treatment. He will contact radiologist to administer IV Decadron on site as well.

## 2020-01-03 ENCOUNTER — Telehealth: Payer: Self-pay | Admitting: Oncology

## 2020-01-03 NOTE — Telephone Encounter (Signed)
Scheduled appointments per 12/20 los. Spoke to patient who is aware of appointments date and times.  

## 2020-01-29 ENCOUNTER — Ambulatory Visit (HOSPITAL_COMMUNITY)
Admission: RE | Admit: 2020-01-29 | Discharge: 2020-01-29 | Disposition: A | Discharge status: Home / Self Care | Payer: No Typology Code available for payment source | Source: Ambulatory Visit | Attending: Oncology | Admitting: Oncology

## 2020-01-29 ENCOUNTER — Other Ambulatory Visit (HOSPITAL_COMMUNITY): Payer: Self-pay | Admitting: Oncology

## 2020-01-29 ENCOUNTER — Other Ambulatory Visit: Payer: Self-pay

## 2020-01-29 DIAGNOSIS — C7B8 Other secondary neuroendocrine tumors: Secondary | ICD-10-CM | POA: Diagnosis present

## 2020-01-29 DIAGNOSIS — C7A8 Other malignant neuroendocrine tumors: Secondary | ICD-10-CM | POA: Diagnosis not present

## 2020-01-29 LAB — CBC WITH DIFFERENTIAL/PLATELET
Abs Immature Granulocytes: 0 10*3/uL (ref 0.00–0.07)
Basophils Absolute: 0 10*3/uL (ref 0.0–0.1)
Basophils Relative: 0 %
Eosinophils Absolute: 0.2 10*3/uL (ref 0.0–0.5)
Eosinophils Relative: 4 %
HCT: 33.8 % — ABNORMAL LOW (ref 36.0–46.0)
Hemoglobin: 11.6 g/dL — ABNORMAL LOW (ref 12.0–15.0)
Immature Granulocytes: 0 %
Lymphocytes Relative: 30 %
Lymphs Abs: 1.4 10*3/uL (ref 0.7–4.0)
MCH: 30.9 pg (ref 26.0–34.0)
MCHC: 34.3 g/dL (ref 30.0–36.0)
MCV: 89.9 fL (ref 80.0–100.0)
Monocytes Absolute: 0.5 10*3/uL (ref 0.1–1.0)
Monocytes Relative: 10 %
Neutro Abs: 2.6 10*3/uL (ref 1.7–7.7)
Neutrophils Relative %: 56 %
Platelets: 191 10*3/uL (ref 150–400)
RBC: 3.76 MIL/uL — ABNORMAL LOW (ref 3.87–5.11)
RDW: 13.4 % (ref 11.5–15.5)
WBC: 4.7 10*3/uL (ref 4.0–10.5)
nRBC: 0 % (ref 0.0–0.2)

## 2020-01-29 LAB — COMPREHENSIVE METABOLIC PANEL
ALT: 26 U/L (ref 0–44)
AST: 23 U/L (ref 15–41)
Albumin: 4.1 g/dL (ref 3.5–5.0)
Alkaline Phosphatase: 51 U/L (ref 38–126)
Anion gap: 10 (ref 5–15)
BUN: 20 mg/dL (ref 6–20)
CO2: 23 mmol/L (ref 22–32)
Calcium: 9.2 mg/dL (ref 8.9–10.3)
Chloride: 106 mmol/L (ref 98–111)
Creatinine, Ser: 0.77 mg/dL (ref 0.44–1.00)
GFR, Estimated: >60 mL/min (ref >60)
Glucose, Bld: 119 mg/dL — ABNORMAL HIGH (ref 70–99)
Potassium: 3.9 mmol/L (ref 3.5–5.1)
Sodium: 139 mmol/L (ref 135–145)
Total Bilirubin: 0.5 mg/dL (ref 0.3–1.2)
Total Protein: 6.8 g/dL (ref 6.5–8.1)

## 2020-01-29 MED ORDER — OCTREOTIDE ACETATE 500 MCG/ML IJ SOLN: 500.0000 ug | Freq: Once | INTRAMUSCULAR | Status: DC | PRN

## 2020-01-29 MED ORDER — PROCHLORPERAZINE EDISYLATE 10 MG/2ML IJ SOLN
INTRAMUSCULAR | Status: AC
  Filled 2020-01-29: qty 2

## 2020-01-29 MED ORDER — SODIUM CHLORIDE 0.9 % IV SOLN
Freq: Once | INTRAVENOUS | Status: AC
  Administered 2020-01-29: 40 mg via INTRAVENOUS
  Filled 2020-01-29: qty 4

## 2020-01-29 MED ORDER — OCTREOTIDE ACETATE 500 MCG/ML IJ SOLN
INTRAMUSCULAR | Status: AC
  Filled 2020-01-29: qty 1

## 2020-01-29 MED ORDER — LANREOTIDE ACETATE 120 MG/0.5ML ~~LOC~~ SOLN
120.0000 mg | Freq: Once | SUBCUTANEOUS | Status: AC
  Administered 2020-01-29: 120 mg via SUBCUTANEOUS

## 2020-01-29 MED ORDER — OCTREOTIDE ACETATE 30 MG IM KIT: 30.0000 mg | PACK | Freq: Once | INTRAMUSCULAR | Status: DC

## 2020-01-29 MED ORDER — ONDANSETRON HCL 8 MG PO TABS: 8.0000 mg | ORAL_TABLET | Freq: Two times a day (BID) | ORAL | 0 refills | Status: DC | PRN

## 2020-01-29 MED ORDER — LANREOTIDE ACETATE 120 MG/0.5ML ~~LOC~~ SOLN
SUBCUTANEOUS | Status: AC
  Filled 2020-01-29: qty 120

## 2020-01-29 MED ORDER — SODIUM CHLORIDE 0.9 % IV SOLN
8.0000 mg | Freq: Once | INTRAVENOUS | Status: DC
  Filled 2020-01-29: qty 4

## 2020-01-29 MED ORDER — SODIUM CHLORIDE 0.9 % IV SOLN: 500.0000 mL | Freq: Once | INTRAVENOUS | Status: DC

## 2020-01-29 MED ORDER — PROCHLORPERAZINE EDISYLATE 10 MG/2ML IJ SOLN: 10.0000 mg | Freq: Four times a day (QID) | INTRAMUSCULAR | Status: DC | PRN

## 2020-01-29 MED ORDER — AMINO ACID RADIOPROTECTANT - L-LYSINE 2.5%/L-ARGININE 2.5% IN NS
250.0000 mL/h | INTRAVENOUS | Status: AC
  Administered 2020-01-29: 250 mL/h via INTRAVENOUS
  Filled 2020-01-29: qty 1000

## 2020-01-29 MED ORDER — LUTETIUM LU 177 DOTATATE 370 MBQ/ML IV SOLN
200.0000 | Freq: Once | INTRAVENOUS | Status: AC
  Administered 2020-01-29: 201.55 via INTRAVENOUS

## 2020-02-26 ENCOUNTER — Other Ambulatory Visit: Payer: Self-pay

## 2020-02-26 ENCOUNTER — Inpatient Hospital Stay: Payer: No Typology Code available for payment source

## 2020-02-26 ENCOUNTER — Inpatient Hospital Stay (HOSPITAL_BASED_OUTPATIENT_CLINIC_OR_DEPARTMENT_OTHER): Payer: No Typology Code available for payment source | Admitting: Nurse Practitioner

## 2020-02-26 ENCOUNTER — Encounter: Payer: Self-pay | Admitting: Nurse Practitioner

## 2020-02-26 ENCOUNTER — Inpatient Hospital Stay: Payer: No Typology Code available for payment source | Attending: Nurse Practitioner

## 2020-02-26 VITALS — BP 102/54 | HR 61 | Temp 97.6°F | Resp 13 | Ht 62.0 in | Wt 176.6 lb

## 2020-02-26 DIAGNOSIS — E7800 Pure hypercholesterolemia, unspecified: Secondary | ICD-10-CM | POA: Diagnosis not present

## 2020-02-26 DIAGNOSIS — D649 Anemia, unspecified: Secondary | ICD-10-CM | POA: Insufficient documentation

## 2020-02-26 DIAGNOSIS — C7A8 Other malignant neuroendocrine tumors: Secondary | ICD-10-CM

## 2020-02-26 DIAGNOSIS — Z79810 Long term (current) use of selective estrogen receptor modulators (SERMs): Secondary | ICD-10-CM | POA: Diagnosis not present

## 2020-02-26 DIAGNOSIS — D0511 Intraductal carcinoma in situ of right breast: Secondary | ICD-10-CM | POA: Insufficient documentation

## 2020-02-26 DIAGNOSIS — D696 Thrombocytopenia, unspecified: Secondary | ICD-10-CM | POA: Insufficient documentation

## 2020-02-26 DIAGNOSIS — C7B8 Other secondary neuroendocrine tumors: Secondary | ICD-10-CM

## 2020-02-26 DIAGNOSIS — R112 Nausea with vomiting, unspecified: Secondary | ICD-10-CM | POA: Diagnosis not present

## 2020-02-26 DIAGNOSIS — Z170 Estrogen receptor positive status [ER+]: Secondary | ICD-10-CM | POA: Insufficient documentation

## 2020-02-26 DIAGNOSIS — Z79899 Other long term (current) drug therapy: Secondary | ICD-10-CM | POA: Diagnosis not present

## 2020-02-26 DIAGNOSIS — C7A098 Malignant carcinoid tumors of other sites: Secondary | ICD-10-CM | POA: Diagnosis not present

## 2020-02-26 LAB — CBC WITH DIFFERENTIAL (CANCER CENTER ONLY)
Abs Immature Granulocytes: 0 10*3/uL (ref 0.00–0.07)
Basophils Absolute: 0 10*3/uL (ref 0.0–0.1)
Basophils Relative: 1 %
Eosinophils Absolute: 0.2 10*3/uL (ref 0.0–0.5)
Eosinophils Relative: 5 %
HCT: 32.2 % — ABNORMAL LOW (ref 36.0–46.0)
Hemoglobin: 11.3 g/dL — ABNORMAL LOW (ref 12.0–15.0)
Immature Granulocytes: 0 %
Lymphocytes Relative: 21 %
Lymphs Abs: 0.9 10*3/uL (ref 0.7–4.0)
MCH: 31.5 pg (ref 26.0–34.0)
MCHC: 35.1 g/dL (ref 30.0–36.0)
MCV: 89.7 fL (ref 80.0–100.0)
Monocytes Absolute: 0.5 10*3/uL (ref 0.1–1.0)
Monocytes Relative: 11 %
Neutro Abs: 2.6 10*3/uL (ref 1.7–7.7)
Neutrophils Relative %: 62 %
Platelet Count: 147 10*3/uL — ABNORMAL LOW (ref 150–400)
RBC: 3.59 MIL/uL — ABNORMAL LOW (ref 3.87–5.11)
RDW: 13.2 % (ref 11.5–15.5)
WBC Count: 4.2 10*3/uL (ref 4.0–10.5)
nRBC: 0 % (ref 0.0–0.2)

## 2020-02-26 LAB — CMP (CANCER CENTER ONLY)
ALT: 25 U/L (ref 0–44)
AST: 18 U/L (ref 15–41)
Albumin: 4 g/dL (ref 3.5–5.0)
Alkaline Phosphatase: 55 U/L (ref 38–126)
Anion gap: 6 (ref 5–15)
BUN: 12 mg/dL (ref 6–20)
CO2: 24 mmol/L (ref 22–32)
Calcium: 8.8 mg/dL — ABNORMAL LOW (ref 8.9–10.3)
Chloride: 110 mmol/L (ref 98–111)
Creatinine: 0.71 mg/dL (ref 0.44–1.00)
GFR, Estimated: >60 mL/min (ref >60)
Glucose, Bld: 81 mg/dL (ref 70–99)
Potassium: 3.9 mmol/L (ref 3.5–5.1)
Sodium: 140 mmol/L (ref 135–145)
Total Bilirubin: 0.3 mg/dL (ref 0.3–1.2)
Total Protein: 6.8 g/dL (ref 6.5–8.1)

## 2020-02-26 MED ORDER — LANREOTIDE ACETATE 120 MG/0.5ML ~~LOC~~ SOLN
120.0000 mg | Freq: Once | SUBCUTANEOUS | Status: AC
  Administered 2020-02-26: 120 mg via SUBCUTANEOUS
  Filled 2020-02-26: qty 120

## 2020-02-26 NOTE — Progress Notes (Signed)
  [place] OFFICE PROGRESS NOTE   Diagnosis: Carcinoid tumor  INTERVAL HISTORY:   Ms. Durrett returns as scheduled.  She completed the fourth and final treatment with Lutathera 01/29/2020.  She again had nausea/vomiting following the Lutathera.  At present she is feeling well.  No recent nausea or vomiting.  No diarrhea.  No flushing episodes.  She denies abdominal pain.  Objective:  Vital signs in last 24 hours:  Blood pressure (!) 102/54, pulse 61, temperature 97.6 F (36.4 C), resp. rate 13, height 5\' 2"  (1.575 m), weight 176 lb 9.6 oz (80.1 kg), last menstrual period 05/17/2017, SpO2 100 %.    HEENT: Neck without mass. Lymphatics: No palpable cervical or supraclavicular lymph nodes. Resp: Lungs clear bilaterally. Cardio: Regular rate and rhythm. GI: Abdomen soft and nontender.  No hepatomegaly. Vascular: No leg edema.  Lab Results:  Lab Results  Component Value Date   WBC 4.2 02/26/2020   HGB 11.3 (L) 02/26/2020   HCT 32.2 (L) 02/26/2020   MCV 89.7 02/26/2020   PLT 147 (L) 02/26/2020   NEUTROABS 2.6 02/26/2020    Imaging:  No results found.  Medications: I have reviewed the patient's current medications.  Assessment/Plan: 1. Metastatic carcinoid involving liver  05/31/2018 breast MRI-numerous T2 lesions throughout the liver.   06/07/2018 MRI of the liver-numerous hepatic lesions scattered throughout all aspects of the liver.  06/14/2018 PET scan-enlarged and hypermetabolic perihepatic and portacaval lymph nodes; diffuse heterogeneous uptake within the liver with subtle areas of mildly increased FDG uptake corresponding to suspicious lesions identified on MRI.   06/21/2018 biopsy of a right liver lobe mass-low-grade neuroendocrine tumor consistent with metastasis, positive CD56, chromogranin, synaptophysin, CDX-2 and cytokeratin AE1/AE3.   07/07/2018 Netspot-well-differentiated neuroendocrine tumor with metastasis to multiple periportal and  peripancreatic lymph nodes, multiple small bilobar hepatic metastasis, solitary middle mediastinal nodal metastasis. Single focus of uptake in the proximal right humerus. No clear primary tumor identified.  Normal chromogranin A 07/05/2018 and normal 24-hour urine 5 HIAA 08/03/2018  Monthly lanreotide beginning 08/03/2018  CT 12/15/2018-small hepatic metastases, mildly improved, slight increase in size of a porta hepatis node  CT abdomen/pelvis 07/02/2019-some of the liver lesions are slightly larger, others stable or smaller, slight increase in splenic lesions, minimal increase in abdominal adenopathy  Cycle 1 Lutathera 08/14/2019  Cycle 2 Lutathera 10/08/2019  Cycle 3 Lutathera 12/03/2019  Cycle 4 Lutathera 01/29/2020 2. Lobular carcinoma in situ right breast 05/29/2018, on tamoxifen, followed by Dr. Lindi Adie 3. Hypercholesterolemia 4. Nausea and vomiting following cycle 1 Lutathera   Disposition: Ms. Waid appears stable.  She completed the fourth Lutathera treatment 01/29/2020.  She is scheduled for a restaging Netspot study/follow-up with Dr. Ilda Foil on 03/11/2020.  She will continue monthly lanreotide.  We reviewed the CBC from today.  She has mild anemia and thrombocytopenia.  This may be related to [place].  We will repeat a CBC with her next visit.  She will return for lab and follow-up in 2 months.  She will contact the office in the interim with any problems.  Plan reviewed with Dr. Benay Spice.    Ned Card ANP/GNP-BC   02/26/2020  1:58 PM

## 2020-02-26 NOTE — Patient Instructions (Signed)
Lanreotide injection What is this medicine? LANREOTIDE (lan REE oh tide) is used to reduce blood levels of growth hormone in patients with a condition called acromegaly. It also works to slow or stop tumor growth in patients with neuroendocrine tumors and treat carcinoid syndrome. This medicine may be used for other purposes; ask your health care provider or pharmacist if you have questions. COMMON BRAND NAME(S): Somatuline Depot What should I tell my health care provider before I take this medicine? They need to know if you have any of these conditions:  diabetes  gallbladder disease  heart disease  kidney disease  liver disease  thyroid disease  an unusual or allergic reaction to lanreotide, other medicines, foods, dyes, or preservatives  pregnant or trying to get pregnant  breast-feeding How should I use this medicine? This medicine is for injection under the skin. It is given by a health care professional in a hospital or clinic setting. Contact your pediatrician or health care professional regarding the use of this medicine in children. Special care may be needed. Overdosage: If you think you have taken too much of this medicine contact a poison control center or emergency room at once. NOTE: This medicine is only for you. Do not share this medicine with others. What if I miss a dose? It is important not to miss your dose. Call your doctor or health care professional if you are unable to keep an appointment. What may interact with this medicine? This medicine may interact with the following medications:  bromocriptine  cyclosporine  certain medicines for blood pressure, heart disease, irregular heart beat  certain medicines for diabetes  quinidine  terfenadine This list may not describe all possible interactions. Give your health care provider a list of all the medicines, herbs, non-prescription drugs, or dietary supplements you use. Also tell them if you smoke,  drink alcohol, or use illegal drugs. Some items may interact with your medicine. What should I watch for while using this medicine? Tell your doctor or healthcare professional if your symptoms do not start to get better or if they get worse. Visit your doctor or health care professional for regular checks on your progress. Your condition will be monitored carefully while you are receiving this medicine. This medicine may increase blood sugar. Ask your healthcare provider if changes in diet or medicines are needed if you have diabetes. You may need blood work done while you are taking this medicine. Women should inform their doctor if they wish to become pregnant or think they might be pregnant. There is a potential for serious side effects to an unborn child. Talk to your health care professional or pharmacist for more information. Do not breast-feed an infant while taking this medicine or for 6 months after stopping it. This medicine has caused ovarian failure in some women. This medicine may interfere with the ability to have a child. Talk with your doctor or health care professional if you are concerned about your fertility. What side effects may I notice from receiving this medicine? Side effects that you should report to your doctor or health care professional as soon as possible:  allergic reactions like skin rash, itching or hives, swelling of the face, lips, or tongue  increased blood pressure  severe stomach pain  signs and symptoms of hgh blood sugar such as being more thirsty or hungry or having to urinate more than normal. You may also feel very tired or have blurry vision.  signs and symptoms of low blood   sugar such as feeling anxious; confusion; dizziness; increased hunger; unusually weak or tired; sweating; shakiness; cold; irritable; headache; blurred vision; fast heartbeat; loss of consciousness  unusually slow heartbeat Side effects that usually do not require medical  attention (report to your doctor or health care professional if they continue or are bothersome):  constipation  diarrhea  dizziness  headache  muscle pain  muscle spasms  nausea  pain, redness, or irritation at site where injected This list may not describe all possible side effects. Call your doctor for medical advice about side effects. You may report side effects to FDA at 1-800-FDA-1088. Where should I keep my medicine? This drug is given in a hospital or clinic and will not be stored at home. NOTE: This sheet is a summary. It may not cover all possible information. If you have questions about this medicine, talk to your doctor, pharmacist, or health care provider.  2021 Elsevier/Gold Standard (2017-10-06 09:13:08)  

## 2020-02-27 ENCOUNTER — Telehealth: Payer: Self-pay | Admitting: Nurse Practitioner

## 2020-02-27 NOTE — Telephone Encounter (Signed)
Scheduled appointments per 2/15 los. Spoke to patient who is aware of appointments dates and times.

## 2020-02-28 LAB — CHROMOGRANIN A: Chromogranin A (ng/mL): 13.8 ng/mL (ref 0.0–101.8)

## 2020-03-11 ENCOUNTER — Ambulatory Visit (HOSPITAL_COMMUNITY): Payer: PRIVATE HEALTH INSURANCE

## 2020-03-11 ENCOUNTER — Ambulatory Visit (HOSPITAL_COMMUNITY): Admission: RE | Admit: 2020-03-11 | Payer: PRIVATE HEALTH INSURANCE | Source: Ambulatory Visit

## 2020-03-11 ENCOUNTER — Ambulatory Visit (HOSPITAL_COMMUNITY)
Admission: RE | Admit: 2020-03-11 | Discharge: 2020-03-11 | Disposition: A | Discharge status: Home / Self Care | Payer: No Typology Code available for payment source | Source: Ambulatory Visit | Attending: Oncology | Admitting: Oncology

## 2020-03-11 ENCOUNTER — Other Ambulatory Visit: Payer: Self-pay

## 2020-03-11 DIAGNOSIS — C7A8 Other malignant neuroendocrine tumors: Secondary | ICD-10-CM

## 2020-03-11 MED ORDER — GALLIUM GA 68 DOTATATE IV KIT
4.5000 | PACK | Freq: Once | INTRAVENOUS | Status: AC
  Administered 2020-03-11: 4.5 via INTRAVENOUS

## 2020-03-11 NOTE — Consult Note (Signed)
Chief Complaint: Patient with metastatic neuroendocrine tumor post completion of 4 cycles Peptide receptor radiotherapy (PRRT) with IO973 DOTATATE (Lutathera).  Referring Physician(s):Sherrill    Patient Status: [facility] - Out-pt  History of Present Illness: 55 year old female with well differentiated neuroendocrine tumor with liver metastasis   Initial disease discovered incidentally breast MRI which noted liver lesions.  Liver lesions biopsied in June 2020 revealing well differentiated neuroendocrine tumor.  Low-grade.   Subsequent DOTATATE PET scan (07/07/2018) demonstrated well differentiated neuroendocrine tumor metastasis in liver and porta hepatis nodes.   Normal chromogranin A and urine serotonin.  No symptoms related carcinoid disease.   Monthly Sandostatin injections initiated 08/03/2018.   Subsequent follow-up surveillance CT and DOTATATE PET scan (07/26/1999) demonstrate progression hepatic metastasis as well as confirmation skeletal metastasis.    Patient status post peptide receptor radiotherapy with 4 cycles of Lu 177 DOTATATE  (532 millicuries).  Completion date 01/29/2020  ]    Past Medical History:  Diagnosis Date  . Family history of breast cancer   . Hyperlipidemia   . PONV (postoperative nausea and vomiting)   . SVD (spontaneous vaginal delivery)    x 3    Past Surgical History:  Procedure Laterality Date  . BILATERAL SALPINGECTOMY Bilateral 06/01/2017   Procedure: BILATERAL SALPINGECTOMY;  Surgeon: Christophe Louis, MD;  Location: [facility];  Service: Gynecology;  Laterality: Bilateral;  . BREAST BIOPSY Left 2009  . CHOLECYSTECTOMY  2002  . COLONOSCOPY     polyp  . CYSTOSCOPY N/A 06/01/2017   Procedure: CYSTOSCOPY;  Surgeon: Bjorn Loser, MD;  Location: [facility];  Service: Urology;  Laterality: N/A;  . DILATION AND CURETTAGE OF UTERUS  2005  . left arm surgery     nerve relaese  . NovaSure Ablation  2006  . PUBOVAGINAL SLING N/A 06/01/2017    Procedure: Gaynelle Arabian;  Surgeon: Bjorn Loser, MD;  Location: [facility];  Service: Urology;  Laterality: N/A;  . TUBAL LIGATION    . VAGINAL HYSTERECTOMY N/A 06/01/2017   Procedure: HYSTERECTOMY VAGINAL;  Surgeon: Christophe Louis, MD;  Location: [facility];  Service: Gynecology;  Laterality: N/A;    Allergies: Codeine and Sulfa antibiotics  Medications: Prior to Admission medications   Medication Sig Start Date End Date Taking? Authorizing Provider  Biotin w/ Vitamins C & E (HAIR SKIN & NAILS GUMMIES PO) Take 2 tablets by mouth daily.    [provider]  diclofenac (VOLTAREN) 50 MG EC tablet Take 50 mg by mouth 2 (two) times daily as needed. 11/17/19   [provider]  Lanreotide Acetate 60 MG/0.2ML SOLN Inject into the skin every 30 (thirty) days.    [provider]  ondansetron (ZOFRAN) 8 MG tablet Take 1 tablet (8 mg total) by mouth 2 (two) times daily as needed for nausea or vomiting. 01/29/20   Gus Height, MD  rosuvastatin (CRESTOR) 5 MG tablet Take 5 mg by mouth every other day.  05/24/18   [provider]  tamoxifen (NOLVADEX) 20 MG tablet TAKE 1 TABLET(20 MG) BY MOUTH DAILY 06/04/19   Nicholas Lose, MD     Family History  Problem Relation Age of Onset  . Breast cancer Mother 60       again at age 19  . Heart attack Mother   . Lung cancer Mother   . Hyperlipidemia Mother   . Dementia Father   . Other Maternal Grandmother        died in childbirth  . Colon cancer Paternal Grandmother   .  Breast cancer Other        3 of MGMs sisters    Social History   Socioeconomic History  . Marital status: Single    Spouse name: Not on file  . Number of children: 3  . Years of education: Not on file  . Highest education level: Not on file  Occupational History  . Not on file  Tobacco Use  . Smoking status: Never Smoker  . Smokeless tobacco: Never Used  Vaping Use  . Vaping Use: Never used  Substance and Sexual Activity  . Alcohol use: Yes     Alcohol/week: 2.0 - 3.0 standard drinks    Types: 2 - 3 Glasses of wine per week  . Drug use: Never  . Sexual activity: Yes    Birth control/protection: Surgical  Other Topics Concern  . Not on file  Social History Narrative  . Not on file   Social Determinants of Health   Financial Resource Strain: Not on file  Food Insecurity: Not on file  Transportation Needs: Not on file  Physical Activity: Not on file  Stress: Not on file  Social Connections: Not on file    ECOG Status: 0 - Asymptomatic  Review of Systems: A 12 point ROS discussed and pertinent positives are indicated in the HPI above.  All other systems are negative.  Review of Systems   No carcinoid symptoms.   Nausea and vomitting post treatment resolved. Vital Signs: LMP 05/17/2017 (Approximate)   Physical Exam   Phone interview  Imaging: NM PET (NETSPOT GA 53 DOTATATE) SKULL BASE TO MID THIGH  Result Date: 03/11/2020 CLINICAL DATA:  Metastatic well differentiated neuroendocrine tumor status post peptide receptor radiotherapy. Four doses of Lu-177 DOTATATE administered. Therapy completed completed 01/29/2020. EXAM: NUCLEAR MEDICINE PET SKULL BASE TO THIGH TECHNIQUE: 4.5 mCi gallium 68 DOTATATE was injected intravenously. Full-ring PET imaging was performed from the skull base to thigh after the radiotracer. CT data was obtained and used for attenuation correction and anatomic localization. COMPARISON:  DOTATATE PET scan 07/26/2019 FINDINGS: NECK No radiotracer activity in neck lymph nodes. Incidental CT findings: None CHEST Small subcarinal lymph node has significant reduced metabolic activity SUV max equal 4.1 decreased from 11.1. Node is very small measuring 3 mm on image 64/4. Resolution of LEFT supraclavicular lymph node activity. Very small node with moderate activity previously. Incidental CT finding:No suspicious pulmonary nodules. ABDOMEN/PELVIS Marked reduction radiotracer activity within hepatic metastasis.  The largest lesions in the posterior RIGHT hepatic lobe previously measuring approximately 5 cm with intense radiotracer activity (SUV max equal 31) is now is no longer identifiable DOTATATE uptake. Likewise cluster of more superior lesions within the dome of the liver are also no longer identified. Only 1 lesion in the liver remains identifiable within the anterior LEFT hepatic lobe with SUV max equal 15 decreased from SUV max 23.5 (Image 85). No new lesions within the liver. Periportal lymph node decreased from 17 mm to 16 mm (image 93/4) with SUV max equal 18 decreased from SUV max equal 25.7. Likewise lesion adjacent to the pancreatic head measures only 5 mm decreased from 16 mm (image 100) with SUV max equal 18 decreased from SUV max equal 25. No new retroperitoneal adenopathy. No mesenteric adenopathy.  No bowel lesions. Physiologic activity noted in the liver, spleen, adrenal glands and kidneys. Incidental CT findings:None SKELETON Resolution of the focal metabolic activity in the proximal RIGHT humerus. Near complete resolution of radiotracer activity in the transverse process on the LEFT at  T3 with SUV max equal 2.2 decreased from 5.0. Decrease in activity and posterior RIGHT acetabular lesion with SUV max equal 2.7 compared to 3.9. Incidental CT findings:None IMPRESSION: 1. Overall significant positive response to peptide receptor radiotherapy (Lu-177 DOTATATE). 2. Near complete resolution of radiotracer activity within hepatic metastasis. Only 1 measurable lesion remains while remaining lesions do not have radiotracer activity. 3. Marked decrease in size and radiotracer activity of periportal and peripancreatic lymph nodes. 4. Decreased radiotracer activity subcarinal and resolution LEFT supraclavicular small metastatic lymph nodes. 5. Resolution of radiotracer activity in the RIGHT humerus and decreased activity in the T3 vertebral body and RIGHT acetabulum. 6. No evidence new or progressive disease.  Electronically Signed   By: Suzy Bouchard M.D.   On: 03/11/2020 14:27   NM PET (NETSPOT GA 62 DOTATATE) SKULL BASE TO MID THIGH  Result Date: 07/26/2019 CLINICAL DATA:  Well differentiated neuroendocrine tumor with liver metastasis and periportal nodal metastasis. Suspicion skeletal metastasis. EXAM: NUCLEAR MEDICINE PET SKULL BASE TO THIGH TECHNIQUE: 4.5 mCi Ga 25 DOTATATE was injected intravenously. Full-ring PET imaging was performed from the skull base to thigh after the radiotracer. CT data was obtained and used for attenuation correction and anatomic localization. COMPARISON:  Dotatate PET-CT 07/07/2018,  diagnostic CT scan 07/02/18 FINDINGS: NECK No radiotracer activity in neck lymph nodes. Incidental CT findings: None CHEST Intense radiotracer activity again noted in small the subcarinal lymph node with SUV max equal 11.1 compared SUV max equal 6.8. Lymph node measures only 5 mm on image 61/4. Focal activity associated with a small LEFT supraclavicular node measures only 3 mm with SUV max equal 3.9 (image 41). Incidental CT finding:No pulmonary parenchymal nodularity ABDOMEN/PELVIS Again demonstrated multifocal hepatic metastasis with intense radiotracer activity. These are difficult to define on the noncontrast CT however are appear larger and more intense on PET imaging. For example lesions in the a posterior RIGHT hepatic lobe with SUV max equal 31 increased from SUV max equal 14. These lesions were measured to increase on most recent diagnostic CT abdomen scan. Lesion in the anterior central LEFT hepatic lobe with SUV max equal 23 increased from SUV max equal 13 (image 80). Several small lesions are now more conspicuous. Example lesion anterior margin of the RIGHT hepatic lobe with SUV max equal 25 (image 96) increased from SUV max equal 17. Again demonstrated several lymph nodes adjacent pancreatic head and porta hepatis which are small but intensely hypermetabolic. Example node measures measures  16 mm (image 95/4) compared to 16 mm with SUV max equal 26 compared SUV max equal 25. Node adjacent head of the pancreas measures 14 mm compared to 13 mm with SUV max equal 25 compared SUV max 24 Several smaller radiotracer avid nodes are present at the root of the mesentery similar prior. No discrete small bowel lesions identified. No evidence of peritoneal metastasis in pelvis. Physiologic activity noted in the liver, spleen, adrenal glands and kidneys. Incidental CT findings:None SKELETON Now more convincing evidence of skeletal metastasis. Lesion in the proximal RIGHT humerus with SUV max equal 9.5 increased from SUV max equal 4.5. There may be a small sclerotic component this lesion on image 30/4. Lesion in the proximal LEFT femur humerus with SUV max equal 3.9 (image 26). Focal lesion in the LEFT transverse process of the T3 vertebral body with SUV max equal 5.0 ( image 45). Focal lesion in the posterior aspect of the RIGHT acetabulum with SUV max equal 3.9. These lesions are occult by CT imaging Incidental  CT findings:None IMPRESSION: 1. Mild progression of well differentiated neuroendocrine tumor metastasis compared to DOTATATE PET scan 07/07/2018 2. Interval progression of multifocal hepatic metastasis. No new lesions identified. 3. Interval progression small volume mediastinal nodal metastasis with two very small metastatic nodes. 4. Progression of skeletal metastasis with approximately 4 discrete very small radiotracer avid lesions within the proximal humeri, spine and pelvis. Electronically Signed   By: Suzy Bouchard M.D.   On: 07/26/2019 11:03    Labs:  CBC: Recent Labs    12/03/19 0830 12/31/19 1103 01/29/20 0830 02/26/20 1327  WBC 4.7 4.2 4.7 4.2  HGB 12.1 12.1 11.6* 11.3*  HCT 35.1* 35.9* 33.8* 32.2*  PLT 210 176 191 147*    COAGS: No results for input(s): INR, APTT in the last 8760 hours.  BMP: Recent Labs    07/02/19 1019 07/02/19 1019 08/14/19 0826 09/13/19 1018  10/08/19 0920 11/05/19 1004 12/03/19 0830 12/31/19 1103 01/29/20 0830 02/26/20 1327  NA 144  --  141 142 144   < > 142 142 139 140  K 4.3  --  4.1 4.2 3.9   < > 3.9 5.2* 3.9 3.9  CL 109  --  108 107 107   < > 107 110 106 110  CO2 25  --  24 28 25    < > 25 27 23 24   GLUCOSE 104*  --  119* 128* 116*   < > 124* 120* 119* 81  BUN 12  --  20 16 14    < > 16 18 20 12   CALCIUM 9.2  --  9.1 10.1 9.1   < > 9.1 9.5 9.2 8.8*  CREATININE 0.85   < > 0.75 0.75 0.73   < > 0.70 0.80 0.77 0.71  GFRNONAA >60   < > >60 >60 >60   < > >60 >60 >60 >60  GFRAA >60  --  >60 >60 >60  --   --   --   --   --    < > = values in this interval not displayed.    LIVER FUNCTION TESTS: Recent Labs    12/03/19 0830 12/31/19 1103 01/29/20 0830 02/26/20 1327  BILITOT 0.6 0.5 0.5 0.3  AST 27 22 23 18   ALT 32 29 26 25   ALKPHOS 57 66 51 55  PROT 7.1 6.9 6.8 6.8  ALBUMIN 4.2 3.7 4.1 4.0    TUMOR MARKERS: No results for input(s): AFPTM, CEA, CA199, CHROMOGRNA in the last 8760 hours.  Assessment and Plan:  1. Patient status post peptide receptor radiotherapy with 4 cycles of Lu 177 DOTATATE  (025 millicuries).  Completion date 01/29/2020. 2. Follow-up DOTATATE PET scan (03/11/2020) demonstrated significant positive response to therapy with near complete resolution of a hepatic metastasis  and marked reduction in size and radiotracer activity of metastatic lymph nodes.  Significant reduction in skeletal metastasis.  No evidence of disease progression  3. No significant post therapy radiotoxicity.  Normal renal function liver function post therapy.  Normal white blood cell count.  Platelets and hemoglobin minimally reduced with expectation to return to normal.  4. Recommend continued surveillance of blood counts.  5. Chromogranin A with significant reduced by 85% (97 units to 14 units).  6. Considered continued Sandostatin injections.     Thank you for this interesting consult.  I greatly enjoyed meeting JERMIA RIGSBY and look forward to participating in their care.  A copy of this report was sent to the requesting provider on this  date.  Electronically Signed: Rennis Golden, MD 03/11/2020, 2:42 PM   I spent a total of    10 Minutes in face to face in clinical consultation, greater than 50% of which was counseling/coordinating care for metastatic neuroendocrine tumor.

## 2020-03-25 ENCOUNTER — Inpatient Hospital Stay: Payer: No Typology Code available for payment source | Attending: Oncology

## 2020-03-25 ENCOUNTER — Other Ambulatory Visit: Payer: Self-pay

## 2020-03-25 VITALS — BP 106/70 | HR 57

## 2020-03-25 DIAGNOSIS — C7A8 Other malignant neuroendocrine tumors: Secondary | ICD-10-CM | POA: Insufficient documentation

## 2020-03-25 DIAGNOSIS — Z79899 Other long term (current) drug therapy: Secondary | ICD-10-CM | POA: Insufficient documentation

## 2020-03-25 MED ORDER — LANREOTIDE ACETATE 120 MG/0.5ML ~~LOC~~ SOLN
120.0000 mg | Freq: Once | SUBCUTANEOUS | Status: AC
  Administered 2020-03-25: 120 mg via SUBCUTANEOUS
  Filled 2020-03-25: qty 120

## 2020-04-21 ENCOUNTER — Other Ambulatory Visit: Payer: Self-pay | Admitting: Family Medicine

## 2020-04-21 DIAGNOSIS — Z1231 Encounter for screening mammogram for malignant neoplasm of breast: Secondary | ICD-10-CM

## 2020-04-22 ENCOUNTER — Inpatient Hospital Stay: Payer: No Typology Code available for payment source

## 2020-04-22 ENCOUNTER — Ambulatory Visit: Payer: PRIVATE HEALTH INSURANCE

## 2020-04-22 ENCOUNTER — Other Ambulatory Visit: Payer: Self-pay

## 2020-04-22 ENCOUNTER — Inpatient Hospital Stay: Payer: No Typology Code available for payment source | Attending: Nurse Practitioner | Admitting: Oncology

## 2020-04-22 ENCOUNTER — Other Ambulatory Visit: Payer: PRIVATE HEALTH INSURANCE

## 2020-04-22 VITALS — BP 110/78 | HR 60 | Temp 98.1°F | Resp 20 | Ht 62.0 in | Wt 175.2 lb

## 2020-04-22 DIAGNOSIS — C7B8 Other secondary neuroendocrine tumors: Secondary | ICD-10-CM | POA: Diagnosis not present

## 2020-04-22 DIAGNOSIS — Z79810 Long term (current) use of selective estrogen receptor modulators (SERMs): Secondary | ICD-10-CM | POA: Diagnosis not present

## 2020-04-22 DIAGNOSIS — C7A8 Other malignant neuroendocrine tumors: Secondary | ICD-10-CM

## 2020-04-22 DIAGNOSIS — D0501 Lobular carcinoma in situ of right breast: Secondary | ICD-10-CM | POA: Diagnosis not present

## 2020-04-22 DIAGNOSIS — E7800 Pure hypercholesterolemia, unspecified: Secondary | ICD-10-CM | POA: Diagnosis not present

## 2020-04-22 DIAGNOSIS — R112 Nausea with vomiting, unspecified: Secondary | ICD-10-CM | POA: Diagnosis not present

## 2020-04-22 DIAGNOSIS — Z79899 Other long term (current) drug therapy: Secondary | ICD-10-CM | POA: Diagnosis not present

## 2020-04-22 DIAGNOSIS — C7B02 Secondary carcinoid tumors of liver: Secondary | ICD-10-CM | POA: Diagnosis not present

## 2020-04-22 DIAGNOSIS — C7A098 Malignant carcinoid tumors of other sites: Secondary | ICD-10-CM | POA: Insufficient documentation

## 2020-04-22 LAB — CBC WITH DIFFERENTIAL (CANCER CENTER ONLY)
Abs Immature Granulocytes: 0.01 10*3/uL (ref 0.00–0.07)
Basophils Absolute: 0 10*3/uL (ref 0.0–0.1)
Basophils Relative: 1 %
Eosinophils Absolute: 0.2 10*3/uL (ref 0.0–0.5)
Eosinophils Relative: 4 %
HCT: 35 % — ABNORMAL LOW (ref 36.0–46.0)
Hemoglobin: 11.8 g/dL — ABNORMAL LOW (ref 12.0–15.0)
Immature Granulocytes: 0 %
Lymphocytes Relative: 26 %
Lymphs Abs: 1.1 10*3/uL (ref 0.7–4.0)
MCH: 31.5 pg (ref 26.0–34.0)
MCHC: 33.7 g/dL (ref 30.0–36.0)
MCV: 93.3 fL (ref 80.0–100.0)
Monocytes Absolute: 0.4 10*3/uL (ref 0.1–1.0)
Monocytes Relative: 10 %
Neutro Abs: 2.6 10*3/uL (ref 1.7–7.7)
Neutrophils Relative %: 59 %
Platelet Count: 189 10*3/uL (ref 150–400)
RBC: 3.75 MIL/uL — ABNORMAL LOW (ref 3.87–5.11)
RDW: 14.1 % (ref 11.5–15.5)
WBC Count: 4.4 10*3/uL (ref 4.0–10.5)
nRBC: 0 % (ref 0.0–0.2)

## 2020-04-22 MED ORDER — LANREOTIDE ACETATE 120 MG/0.5ML ~~LOC~~ SOLN
120.0000 mg | Freq: Once | SUBCUTANEOUS | Status: AC
  Administered 2020-04-22: 120 mg via SUBCUTANEOUS
  Filled 2020-04-22: qty 120

## 2020-04-22 NOTE — Patient Instructions (Signed)
Lanreotide injection What is this medicine? LANREOTIDE (lan REE oh tide) is used to reduce blood levels of growth hormone in patients with a condition called acromegaly. It also works to slow or stop tumor growth in patients with neuroendocrine tumors and treat carcinoid syndrome. This medicine may be used for other purposes; ask your health care provider or pharmacist if you have questions. COMMON BRAND NAME(S): Somatuline Depot What should I tell my health care provider before I take this medicine? They need to know if you have any of these conditions:  diabetes  gallbladder disease  heart disease  kidney disease  liver disease  thyroid disease  an unusual or allergic reaction to lanreotide, other medicines, foods, dyes, or preservatives  pregnant or trying to get pregnant  breast-feeding How should I use this medicine? This medicine is for injection under the skin. It is given by a health care professional in a hospital or clinic setting. Contact your pediatrician or health care professional regarding the use of this medicine in children. Special care may be needed. Overdosage: If you think you have taken too much of this medicine contact a poison control center or emergency room at once. NOTE: This medicine is only for you. Do not share this medicine with others. What if I miss a dose? It is important not to miss your dose. Call your doctor or health care professional if you are unable to keep an appointment. What may interact with this medicine? This medicine may interact with the following medications:  bromocriptine  cyclosporine  certain medicines for blood pressure, heart disease, irregular heart beat  certain medicines for diabetes  quinidine  terfenadine This list may not describe all possible interactions. Give your health care provider a list of all the medicines, herbs, non-prescription drugs, or dietary supplements you use. Also tell them if you smoke,  drink alcohol, or use illegal drugs. Some items may interact with your medicine. What should I watch for while using this medicine? Tell your doctor or healthcare professional if your symptoms do not start to get better or if they get worse. Visit your doctor or health care professional for regular checks on your progress. Your condition will be monitored carefully while you are receiving this medicine. This medicine may increase blood sugar. Ask your healthcare provider if changes in diet or medicines are needed if you have diabetes. You may need blood work done while you are taking this medicine. Women should inform their doctor if they wish to become pregnant or think they might be pregnant. There is a potential for serious side effects to an unborn child. Talk to your health care professional or pharmacist for more information. Do not breast-feed an infant while taking this medicine or for 6 months after stopping it. This medicine has caused ovarian failure in some women. This medicine may interfere with the ability to have a child. Talk with your doctor or health care professional if you are concerned about your fertility. What side effects may I notice from receiving this medicine? Side effects that you should report to your doctor or health care professional as soon as possible:  allergic reactions like skin rash, itching or hives, swelling of the face, lips, or tongue  increased blood pressure  severe stomach pain  signs and symptoms of hgh blood sugar such as being more thirsty or hungry or having to urinate more than normal. You may also feel very tired or have blurry vision.  signs and symptoms of low blood   sugar such as feeling anxious; confusion; dizziness; increased hunger; unusually weak or tired; sweating; shakiness; cold; irritable; headache; blurred vision; fast heartbeat; loss of consciousness  unusually slow heartbeat Side effects that usually do not require medical  attention (report to your doctor or health care professional if they continue or are bothersome):  constipation  diarrhea  dizziness  headache  muscle pain  muscle spasms  nausea  pain, redness, or irritation at site where injected This list may not describe all possible side effects. Call your doctor for medical advice about side effects. You may report side effects to FDA at 1-800-FDA-1088. Where should I keep my medicine? This drug is given in a hospital or clinic and will not be stored at home. NOTE: This sheet is a summary. It may not cover all possible information. If you have questions about this medicine, talk to your doctor, pharmacist, or health care provider.  2021 Elsevier/Gold Standard (2017-10-06 09:13:08)  

## 2020-04-22 NOTE — Progress Notes (Signed)
[facility] OFFICE PROGRESS NOTE   Diagnosis: Carcinoid tumor  INTERVAL HISTORY:   Sally Brown completed a final treatment with Lutathera on 01/29/2020.  She had nausea for 10 days following the Lutathera despite antiemetic premedication.  She feels well at present.  She has discomfort at the dorsum of the left foot.  No flushing or diarrhea.  Objective:  Vital signs in last 24 hours:  Blood pressure 110/78, pulse 60, temperature 98.1 F (36.7 C), temperature source Tympanic, resp. rate 20, height 5\' 2"  (1.575 m), weight 175 lb 3.2 oz (79.5 kg), last menstrual period 05/17/2017, SpO2 99 %.    Resp: Lungs clear bilaterally Cardio: Regular rate and rhythm GI: No hepatosplenomegaly, no mass, nontender Vascular: No leg edema Musculoskeletal: Examination of the dorsum of the left foot is unremarkable.   Lab Results:  Lab Results  Component Value Date   WBC 4.4 04/22/2020   HGB 11.8 (L) 04/22/2020   HCT 35.0 (L) 04/22/2020   MCV 93.3 04/22/2020   PLT 189 04/22/2020   NEUTROABS 2.6 04/22/2020    CMP  Lab Results  Component Value Date   NA 140 02/26/2020   K 3.9 02/26/2020   CL 110 02/26/2020   CO2 24 02/26/2020   GLUCOSE 81 02/26/2020   BUN 12 02/26/2020   CREATININE 0.71 02/26/2020   CALCIUM 8.8 (L) 02/26/2020   PROT 6.8 02/26/2020   ALBUMIN 4.0 02/26/2020   AST 18 02/26/2020   ALT 25 02/26/2020   ALKPHOS 55 02/26/2020   BILITOT 0.3 02/26/2020   GFRNONAA >60 02/26/2020   GFRAA >60 10/08/2019     Medications: I have reviewed the patient's current medications.   Assessment/Plan: 1. Metastatic carcinoid involving liver  05/31/2018 breast MRI-numerous T2 lesions throughout the liver.   06/07/2018 MRI of the liver-numerous hepatic lesions scattered throughout all aspects of the liver.  06/14/2018 PET scan-enlarged and hypermetabolic perihepatic and portacaval lymph nodes; diffuse heterogeneous uptake within the liver with subtle areas of mildly  increased FDG uptake corresponding to suspicious lesions identified on MRI.   06/21/2018 biopsy of a right liver lobe mass-low-grade neuroendocrine tumor consistent with metastasis, positive CD56, chromogranin, synaptophysin, CDX-2 and cytokeratin AE1/AE3.   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 Single focus of uptake in the proximal right humerus. No clear primary tumor identified.  Normal chromogranin A 07/05/2018 and normal 24-hour urine 5 HIAA 08/03/2018  Monthly lanreotide beginning 08/03/2018  CT 12/15/2018-small hepatic metastases, mildly improved, slight increase in size of a porta hepatis node  CT abdomen/pelvis 07/02/2019-some of the liver lesions are slightly larger, others stable or smaller, slight increase in splenic lesions, minimal increase in abdominal adenopathy  Cycle 1 Lutathera 08/14/2019  Cycle 2 Lutathera 10/08/2019  Cycle 3 Lutathera 12/03/2019  Cycle 4 Lutathera 01/29/2020  Netspot 03/11/2020-marked reduction in activity within hepatic metastases with only 1 remaining measurable lesion, decrease in size and radiotracer activity involving periportal and peripancreatic lymph nodes, decreased radiotracer activity at a subcarinal node and resolution of activity at a left supraclavicular node, resolution of focal metabolic activity in the right humerus, near complete resolution of activity in the left T3 transverse process, decreased activity involving a right acetabular lesion, no evidence of new or progressive disease  Monthly lanreotide continued 3.   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 Lindi Brown 4.   Hypercholesterolemia 5.   Nausea and vomiting  following cycle 1 Lutathera     Disposition: Sally Brown appears stable.  The restaging Netspot confirmed  a response to [place].  She continues monthly lanreotide.  She will return for an office visit and chromogranin a level in 3 months.  She will follow-up with orthopedics for evaluation of the foot pain.  I doubt this is related to the carcinoid tumor.  Sally Coder, MD  04/22/2020  3:22 PM

## 2020-05-12 ENCOUNTER — Other Ambulatory Visit: Payer: Self-pay | Admitting: Hematology and Oncology

## 2020-05-20 ENCOUNTER — Inpatient Hospital Stay: Payer: No Typology Code available for payment source | Attending: Nurse Practitioner

## 2020-05-20 ENCOUNTER — Other Ambulatory Visit: Payer: Self-pay

## 2020-05-20 VITALS — BP 115/82 | HR 56 | Temp 97.8°F | Resp 20

## 2020-05-20 DIAGNOSIS — C7A098 Malignant carcinoid tumors of other sites: Secondary | ICD-10-CM | POA: Insufficient documentation

## 2020-05-20 DIAGNOSIS — C7A8 Other malignant neuroendocrine tumors: Secondary | ICD-10-CM

## 2020-05-20 DIAGNOSIS — Z79899 Other long term (current) drug therapy: Secondary | ICD-10-CM | POA: Insufficient documentation

## 2020-05-20 MED ORDER — LANREOTIDE ACETATE 120 MG/0.5ML ~~LOC~~ SOLN
120.0000 mg | Freq: Once | SUBCUTANEOUS | Status: AC
  Administered 2020-05-20: 120 mg via SUBCUTANEOUS

## 2020-05-20 NOTE — Patient Instructions (Signed)
[facility]  Discharge Instructions: Thank you for choosing [facility] to provide your oncology and hematology care.   If you have a lab appointment with the [facility], please go directly to the [facility] and check in at the registration area.   Wear comfortable clothing and clothing appropriate for easy access to any Portacath or PICC line.   We strive to give you quality time with your provider. You may need to reschedule your appointment if you arrive late (15 or more minutes).  Arriving late affects you and other patients whose appointments are after yours.  Also, if you miss three or more appointments without notifying the office, you may be dismissed from the clinic at the provider's discretion.      For prescription refill requests, have your pharmacy contact our office and allow 72 hours for refills to be completed.    Today you received the following chemotherapy and/or immunotherapy agents:  Lanreotide    To help prevent nausea and vomiting after your treatment, we encourage you to take your nausea medication as directed.  BELOW ARE SYMPTOMS THAT SHOULD BE REPORTED IMMEDIATELY: . *FEVER GREATER THAN 100.4 F (38 C) OR HIGHER . *CHILLS OR SWEATING . *NAUSEA AND VOMITING THAT IS NOT CONTROLLED WITH YOUR NAUSEA MEDICATION . *UNUSUAL SHORTNESS OF BREATH . *UNUSUAL BRUISING OR BLEEDING . *URINARY PROBLEMS (pain or burning when urinating, or frequent urination) . *BOWEL PROBLEMS (unusual diarrhea, constipation, pain near the anus) . TENDERNESS IN MOUTH AND THROAT WITH OR WITHOUT PRESENCE OF ULCERS (sore throat, sores in mouth, or a toothache) . UNUSUAL RASH, SWELLING OR PAIN  . UNUSUAL VAGINAL DISCHARGE OR ITCHING   Items with * indicate a potential emergency and should be followed up as soon as possible or go to the Emergency Department if any problems should occur.  Please show the CHEMOTHERAPY ALERT CARD or IMMUNOTHERAPY ALERT  CARD at check-in to the Emergency Department and triage nurse.  Should you have questions after your visit or need to cancel or reschedule your appointment, please contact [facility]  Dept: [phone]  and follow the prompts.  Office hours are 8:00 a.m. to 4:30 p.m. Monday - Friday. Please note that voicemails left after 4:00 p.m. may not be returned until the following business day.  We are closed weekends and major holidays. You have access to a nurse at all times for urgent questions. Please call the main number to the clinic Dept: [phone] and follow the prompts.   For any non-urgent questions, you may also contact your provider using MyChart. We now offer e-Visits for anyone 76 and older to request care online for non-urgent symptoms. For details visit [email or web address].   Also download the MyChart app! Go to the app store, search "MyChart", open the app, select [facility], and log in with your MyChart username and password.  Due to Covid, a mask is required upon entering the hospital/clinic. If you do not have a mask, one will be given to you upon arrival. For doctor visits, patients may have 1 support person aged 34 or older with them. For treatment visits, patients cannot have anyone with them due to current Covid guidelines and our immunocompromised population.   Lanreotide injection What is this medicine? LANREOTIDE (lan REE oh tide) is used to reduce blood levels of growth hormone in patients with a condition called acromegaly. It also works to slow or stop tumor growth in patients with neuroendocrine  tumors and treat carcinoid syndrome. This medicine may be used for other purposes; ask your health care provider or pharmacist if you have questions. COMMON BRAND NAME(S): Somatuline Depot What should I tell my health care provider before I take this medicine? They need to know if you have any of these conditions:  diabetes  gallbladder  disease  heart disease  kidney disease  liver disease  thyroid disease  an unusual or allergic reaction to lanreotide, other medicines, foods, dyes, or preservatives  pregnant or trying to get pregnant  breast-feeding How should I use this medicine? This medicine is for injection under the skin. It is given by a health care professional in a hospital or clinic setting. Contact your pediatrician or health care professional regarding the use of this medicine in children. Special care may be needed. Overdosage: If you think you have taken too much of this medicine contact a poison control center or emergency room at once. NOTE: This medicine is only for you. Do not share this medicine with others. What if I miss a dose? It is important not to miss your dose. Call your doctor or health care professional if you are unable to keep an appointment. What may interact with this medicine? This medicine may interact with the following medications:  bromocriptine  cyclosporine  certain medicines for blood pressure, heart disease, irregular heart beat  certain medicines for diabetes  quinidine  terfenadine This list may not describe all possible interactions. Give your health care provider a list of all the medicines, herbs, non-prescription drugs, or dietary supplements you use. Also tell them if you smoke, drink alcohol, or use illegal drugs. Some items may interact with your medicine. What should I watch for while using this medicine? Tell your doctor or healthcare professional if your symptoms do not start to get better or if they get worse. Visit your doctor or health care professional for regular checks on your progress. Your condition will be monitored carefully while you are receiving this medicine. This medicine may increase blood sugar. Ask your healthcare provider if changes in diet or medicines are needed if you have diabetes. You may need blood work done while you are taking  this medicine. Women should inform their doctor if they wish to become pregnant or think they might be pregnant. There is a potential for serious side effects to an unborn child. Talk to your health care professional or pharmacist for more information. Do not breast-feed an infant while taking this medicine or for 6 months after stopping it. This medicine has caused ovarian failure in some women. This medicine may interfere with the ability to have a child. Talk with your doctor or health care professional if you are concerned about your fertility. What side effects may I notice from receiving this medicine? Side effects that you should report to your doctor or health care professional as soon as possible:  allergic reactions like skin rash, itching or hives, swelling of the face, lips, or tongue  increased blood pressure  severe stomach pain  signs and symptoms of hgh blood sugar such as being more thirsty or hungry or having to urinate more than normal. You may also feel very tired or have blurry vision.  signs and symptoms of low blood sugar such as feeling anxious; confusion; dizziness; increased hunger; unusually weak or tired; sweating; shakiness; cold; irritable; headache; blurred vision; fast heartbeat; loss of consciousness  unusually slow heartbeat Side effects that usually do not require medical attention (report  to your doctor or health care professional if they continue or are bothersome):  constipation  diarrhea  dizziness  headache  muscle pain  muscle spasms  nausea  pain, redness, or irritation at site where injected This list may not describe all possible side effects. Call your doctor for medical advice about side effects. You may report side effects to FDA at 1-800-FDA-1088. Where should I keep my medicine? This drug is given in a hospital or clinic and will not be stored at home. NOTE: This sheet is a summary. It may not cover all possible information. If you  have questions about this medicine, talk to your doctor, pharmacist, or health care provider.  2021 Elsevier/Gold Standard (2017-10-06 09:13:08)

## 2020-06-03 ENCOUNTER — Encounter: Payer: Self-pay | Admitting: Oncology

## 2020-06-05 ENCOUNTER — Other Ambulatory Visit: Payer: Self-pay | Admitting: Podiatry

## 2020-06-05 DIAGNOSIS — M778 Other enthesopathies, not elsewhere classified: Secondary | ICD-10-CM

## 2020-06-06 ENCOUNTER — Other Ambulatory Visit: Payer: Self-pay | Admitting: Podiatry

## 2020-06-06 ENCOUNTER — Encounter: Payer: Self-pay | Admitting: Oncology

## 2020-06-06 ENCOUNTER — Ambulatory Visit (INDEPENDENT_AMBULATORY_CARE_PROVIDER_SITE_OTHER): Payer: PRIVATE HEALTH INSURANCE

## 2020-06-06 ENCOUNTER — Other Ambulatory Visit: Payer: Self-pay

## 2020-06-06 ENCOUNTER — Ambulatory Visit (INDEPENDENT_AMBULATORY_CARE_PROVIDER_SITE_OTHER): Payer: PRIVATE HEALTH INSURANCE | Admitting: Podiatry

## 2020-06-06 ENCOUNTER — Encounter: Payer: Self-pay | Admitting: Podiatry

## 2020-06-06 DIAGNOSIS — M7662 Achilles tendinitis, left leg: Secondary | ICD-10-CM

## 2020-06-06 DIAGNOSIS — C50919 Malignant neoplasm of unspecified site of unspecified female breast: Secondary | ICD-10-CM | POA: Insufficient documentation

## 2020-06-06 DIAGNOSIS — E7800 Pure hypercholesterolemia, unspecified: Secondary | ICD-10-CM | POA: Insufficient documentation

## 2020-06-06 DIAGNOSIS — M778 Other enthesopathies, not elsewhere classified: Secondary | ICD-10-CM

## 2020-06-06 DIAGNOSIS — C7A8 Other malignant neuroendocrine tumors: Secondary | ICD-10-CM | POA: Insufficient documentation

## 2020-06-06 DIAGNOSIS — Z853 Personal history of malignant neoplasm of breast: Secondary | ICD-10-CM | POA: Insufficient documentation

## 2020-06-06 DIAGNOSIS — E669 Obesity, unspecified: Secondary | ICD-10-CM | POA: Insufficient documentation

## 2020-06-06 MED ORDER — DICLOFENAC SODIUM 75 MG PO TBEC: 75.0000 mg | DELAYED_RELEASE_TABLET | Freq: Two times a day (BID) | ORAL | 2 refills | Status: DC

## 2020-06-06 MED ORDER — TRIAMCINOLONE ACETONIDE 10 MG/ML IJ SUSP
10.0000 mg | Freq: Once | INTRAMUSCULAR | Status: AC
  Administered 2020-06-06: 10 mg

## 2020-06-06 NOTE — Progress Notes (Signed)
Subjective:   Patient ID: Sally Brown, female   DOB: 55 y.o.   MRN: [id number]   HPI Patient presents stating she has been having a lot of pain in the back of her left heel for the last several months.  Does not remember specific injury and has tried Advil diclofenac and ice therapy without relief.  Patient does not smoke likes to be active   Review of Systems  All other systems reviewed and are negative.       Objective:  Physical Exam Vitals and nursing note reviewed.  Constitutional:      Appearance: She is well-developed.  Pulmonary:     Effort: Pulmonary effort is normal.  Musculoskeletal:        General: Normal range of motion.  Skin:    General: Skin is warm.  Neurological:     Mental Status: She is alert.     Neurovascular status intact muscle strength adequate range of motion adequate with exquisite discomfort posterior aspect left heel lateral side at the insertional Achilles with no central or medial involvement with good tendon strength mild equinus condition.  Patient is noted to have good digital perfusion well oriented x3     Assessment:  Inflammatory Achilles tendinitis left lateral side no central medial involvement     Plan:  H&P reviewed condition discussed treatment options.  I recommended careful injection explaining chances for rupture and she wants procedure and I did sterile prep injected the lateral side of the Achilles 3 mg Dexasone Kenalog 5 mg Xylocaine keeping it away from central medial side advised on reduced activity ice therapy and stretching exercises.  Heel lift dispensed with instructions on usage and reappoint to recheck if symptoms indicate and placed on Mobic 15 mg daily  X-rays indicate small spur formation no indication stress fracture arthritis

## 2020-06-06 NOTE — Patient Instructions (Signed)

## 2020-06-12 ENCOUNTER — Other Ambulatory Visit: Payer: Self-pay

## 2020-06-12 ENCOUNTER — Ambulatory Visit
Admission: RE | Admit: 2020-06-12 | Discharge: 2020-06-12 | Disposition: A | Discharge status: Home / Self Care | Payer: PRIVATE HEALTH INSURANCE | Source: Ambulatory Visit | Attending: Family Medicine | Admitting: Family Medicine

## 2020-06-12 ENCOUNTER — Encounter: Payer: Self-pay | Admitting: Oncology

## 2020-06-12 DIAGNOSIS — Z1231 Encounter for screening mammogram for malignant neoplasm of breast: Secondary | ICD-10-CM

## 2020-06-17 ENCOUNTER — Inpatient Hospital Stay: Payer: No Typology Code available for payment source | Attending: Nurse Practitioner

## 2020-06-17 ENCOUNTER — Other Ambulatory Visit: Payer: Self-pay

## 2020-06-17 VITALS — BP 125/72 | HR 66 | Temp 98.1°F | Resp 18

## 2020-06-17 DIAGNOSIS — Z79899 Other long term (current) drug therapy: Secondary | ICD-10-CM | POA: Diagnosis not present

## 2020-06-17 DIAGNOSIS — C7A8 Other malignant neuroendocrine tumors: Secondary | ICD-10-CM

## 2020-06-17 DIAGNOSIS — C7A098 Malignant carcinoid tumors of other sites: Secondary | ICD-10-CM | POA: Diagnosis present

## 2020-06-17 DIAGNOSIS — C7B8 Other secondary neuroendocrine tumors: Secondary | ICD-10-CM

## 2020-06-17 MED ORDER — LANREOTIDE ACETATE 120 MG/0.5ML ~~LOC~~ SOLN
120.0000 mg | Freq: Once | SUBCUTANEOUS | Status: AC
  Administered 2020-06-17: 120 mg via SUBCUTANEOUS

## 2020-06-17 NOTE — Patient Instructions (Signed)
Lanreotide injection What is this medicine? LANREOTIDE (lan REE oh tide) is used to reduce blood levels of growth hormone in patients with a condition called acromegaly. It also works to slow or stop tumor growth in patients with neuroendocrine tumors and treat carcinoid syndrome. This medicine may be used for other purposes; ask your health care provider or pharmacist if you have questions. COMMON BRAND NAME(S): Somatuline Depot What should I tell my health care provider before I take this medicine? They need to know if you have any of these conditions:  diabetes  gallbladder disease  heart disease  kidney disease  liver disease  thyroid disease  an unusual or allergic reaction to lanreotide, other medicines, foods, dyes, or preservatives  pregnant or trying to get pregnant  breast-feeding How should I use this medicine? This medicine is for injection under the skin. It is given by a health care professional in a hospital or clinic setting. Contact your pediatrician or health care professional regarding the use of this medicine in children. Special care may be needed. Overdosage: If you think you have taken too much of this medicine contact a poison control center or emergency room at once. NOTE: This medicine is only for you. Do not share this medicine with others. What if I miss a dose? It is important not to miss your dose. Call your doctor or health care professional if you are unable to keep an appointment. What may interact with this medicine? This medicine may interact with the following medications:  bromocriptine  cyclosporine  certain medicines for blood pressure, heart disease, irregular heart beat  certain medicines for diabetes  quinidine  terfenadine This list may not describe all possible interactions. Give your health care provider a list of all the medicines, herbs, non-prescription drugs, or dietary supplements you use. Also tell them if you smoke,  drink alcohol, or use illegal drugs. Some items may interact with your medicine. What should I watch for while using this medicine? Tell your doctor or healthcare professional if your symptoms do not start to get better or if they get worse. Visit your doctor or health care professional for regular checks on your progress. Your condition will be monitored carefully while you are receiving this medicine. This medicine may increase blood sugar. Ask your healthcare provider if changes in diet or medicines are needed if you have diabetes. You may need blood work done while you are taking this medicine. Women should inform their doctor if they wish to become pregnant or think they might be pregnant. There is a potential for serious side effects to an unborn child. Talk to your health care professional or pharmacist for more information. Do not breast-feed an infant while taking this medicine or for 6 months after stopping it. This medicine has caused ovarian failure in some women. This medicine may interfere with the ability to have a child. Talk with your doctor or health care professional if you are concerned about your fertility. What side effects may I notice from receiving this medicine? Side effects that you should report to your doctor or health care professional as soon as possible:  allergic reactions like skin rash, itching or hives, swelling of the face, lips, or tongue  increased blood pressure  severe stomach pain  signs and symptoms of hgh blood sugar such as being more thirsty or hungry or having to urinate more than normal. You may also feel very tired or have blurry vision.  signs and symptoms of low blood   sugar such as feeling anxious; confusion; dizziness; increased hunger; unusually weak or tired; sweating; shakiness; cold; irritable; headache; blurred vision; fast heartbeat; loss of consciousness  unusually slow heartbeat Side effects that usually do not require medical  attention (report to your doctor or health care professional if they continue or are bothersome):  constipation  diarrhea  dizziness  headache  muscle pain  muscle spasms  nausea  pain, redness, or irritation at site where injected This list may not describe all possible side effects. Call your doctor for medical advice about side effects. You may report side effects to FDA at 1-800-FDA-1088. Where should I keep my medicine? This drug is given in a hospital or clinic and will not be stored at home. NOTE: This sheet is a summary. It may not cover all possible information. If you have questions about this medicine, talk to your doctor, pharmacist, or health care provider.  2021 Elsevier/Gold Standard (2017-10-06 09:13:08)  

## 2020-06-23 ENCOUNTER — Encounter: Payer: Self-pay | Admitting: Oncology

## 2020-06-30 ENCOUNTER — Encounter: Payer: Self-pay | Admitting: *Deleted

## 2020-06-30 NOTE — Progress Notes (Signed)
Completed FMLA paperwork and copy sent to HIM for scanning. Patient will pick up form on 07/02/20.

## 2020-07-08 ENCOUNTER — Encounter: Payer: Self-pay | Admitting: Oncology

## 2020-07-11 ENCOUNTER — Other Ambulatory Visit: Payer: Self-pay

## 2020-07-11 DIAGNOSIS — C7A8 Other malignant neuroendocrine tumors: Secondary | ICD-10-CM

## 2020-07-11 NOTE — Progress Notes (Signed)
Lab orders

## 2020-07-15 ENCOUNTER — Other Ambulatory Visit (HOSPITAL_BASED_OUTPATIENT_CLINIC_OR_DEPARTMENT_OTHER): Payer: Self-pay

## 2020-07-15 ENCOUNTER — Encounter: Payer: Self-pay | Admitting: Oncology

## 2020-07-15 ENCOUNTER — Other Ambulatory Visit: Payer: Self-pay

## 2020-07-15 ENCOUNTER — Inpatient Hospital Stay: Payer: No Typology Code available for payment source | Attending: Nurse Practitioner

## 2020-07-15 ENCOUNTER — Inpatient Hospital Stay: Payer: No Typology Code available for payment source

## 2020-07-15 ENCOUNTER — Ambulatory Visit: Payer: No Typology Code available for payment source | Attending: Internal Medicine

## 2020-07-15 ENCOUNTER — Encounter: Payer: Self-pay | Admitting: Nurse Practitioner

## 2020-07-15 ENCOUNTER — Inpatient Hospital Stay (HOSPITAL_BASED_OUTPATIENT_CLINIC_OR_DEPARTMENT_OTHER): Payer: No Typology Code available for payment source | Admitting: Nurse Practitioner

## 2020-07-15 VITALS — BP 126/86 | HR 72 | Temp 98.4°F | Resp 18 | Ht 62.0 in | Wt 172.6 lb

## 2020-07-15 DIAGNOSIS — E7800 Pure hypercholesterolemia, unspecified: Secondary | ICD-10-CM | POA: Insufficient documentation

## 2020-07-15 DIAGNOSIS — Z23 Encounter for immunization: Secondary | ICD-10-CM

## 2020-07-15 DIAGNOSIS — C7B8 Other secondary neuroendocrine tumors: Secondary | ICD-10-CM

## 2020-07-15 DIAGNOSIS — C7A098 Malignant carcinoid tumors of other sites: Secondary | ICD-10-CM | POA: Insufficient documentation

## 2020-07-15 DIAGNOSIS — C7A8 Other malignant neuroendocrine tumors: Secondary | ICD-10-CM | POA: Diagnosis not present

## 2020-07-15 DIAGNOSIS — Z170 Estrogen receptor positive status [ER+]: Secondary | ICD-10-CM | POA: Insufficient documentation

## 2020-07-15 DIAGNOSIS — D0501 Lobular carcinoma in situ of right breast: Secondary | ICD-10-CM | POA: Diagnosis not present

## 2020-07-15 DIAGNOSIS — C787 Secondary malignant neoplasm of liver and intrahepatic bile duct: Secondary | ICD-10-CM | POA: Diagnosis not present

## 2020-07-15 DIAGNOSIS — Z79810 Long term (current) use of selective estrogen receptor modulators (SERMs): Secondary | ICD-10-CM | POA: Insufficient documentation

## 2020-07-15 DIAGNOSIS — Z79899 Other long term (current) drug therapy: Secondary | ICD-10-CM | POA: Insufficient documentation

## 2020-07-15 DIAGNOSIS — R112 Nausea with vomiting, unspecified: Secondary | ICD-10-CM | POA: Diagnosis not present

## 2020-07-15 DIAGNOSIS — C771 Secondary and unspecified malignant neoplasm of intrathoracic lymph nodes: Secondary | ICD-10-CM | POA: Insufficient documentation

## 2020-07-15 LAB — CBC WITH DIFFERENTIAL (CANCER CENTER ONLY)
Abs Immature Granulocytes: 0.02 10*3/uL (ref 0.00–0.07)
Basophils Absolute: 0 10*3/uL (ref 0.0–0.1)
Basophils Relative: 1 %
Eosinophils Absolute: 0.1 10*3/uL (ref 0.0–0.5)
Eosinophils Relative: 2 %
HCT: 36 % (ref 36.0–46.0)
Hemoglobin: 12.1 g/dL (ref 12.0–15.0)
Immature Granulocytes: 0 %
Lymphocytes Relative: 17 %
Lymphs Abs: 0.9 10*3/uL (ref 0.7–4.0)
MCH: 31.8 pg (ref 26.0–34.0)
MCHC: 33.6 g/dL (ref 30.0–36.0)
MCV: 94.7 fL (ref 80.0–100.0)
Monocytes Absolute: 0.4 10*3/uL (ref 0.1–1.0)
Monocytes Relative: 8 %
Neutro Abs: 4 10*3/uL (ref 1.7–7.7)
Neutrophils Relative %: 72 %
Platelet Count: 193 10*3/uL (ref 150–400)
RBC: 3.8 MIL/uL — ABNORMAL LOW (ref 3.87–5.11)
RDW: 13.6 % (ref 11.5–15.5)
WBC Count: 5.5 10*3/uL (ref 4.0–10.5)
nRBC: 0 % (ref 0.0–0.2)

## 2020-07-15 LAB — CMP (CANCER CENTER ONLY)
ALT: 40 U/L (ref 0–44)
AST: 31 U/L (ref 15–41)
Albumin: 4.1 g/dL (ref 3.5–5.0)
Alkaline Phosphatase: 61 U/L (ref 38–126)
Anion gap: 9 (ref 5–15)
BUN: 12 mg/dL (ref 6–20)
CO2: 28 mmol/L (ref 22–32)
Calcium: 9.6 mg/dL (ref 8.9–10.3)
Chloride: 105 mmol/L (ref 98–111)
Creatinine: 0.71 mg/dL (ref 0.44–1.00)
GFR, Estimated: >60 mL/min (ref >60)
Glucose, Bld: 130 mg/dL — ABNORMAL HIGH (ref 70–99)
Potassium: 4.3 mmol/L (ref 3.5–5.1)
Sodium: 142 mmol/L (ref 135–145)
Total Bilirubin: 0.6 mg/dL (ref 0.3–1.2)
Total Protein: 7.1 g/dL (ref 6.5–8.1)

## 2020-07-15 MED ORDER — LANREOTIDE ACETATE 120 MG/0.5ML ~~LOC~~ SOLN
120.0000 mg | Freq: Once | SUBCUTANEOUS | Status: AC
  Administered 2020-07-15: 120 mg via SUBCUTANEOUS

## 2020-07-15 MED ORDER — PFIZER-BIONT COVID-19 VAC-TRIS 30 MCG/0.3ML IM SUSP
INTRAMUSCULAR | 0 refills | Status: DC
  Filled 2020-07-15: qty 0.3, 1d supply, fill #0

## 2020-07-15 NOTE — Patient Instructions (Signed)
Lanreotide injection What is this medication? LANREOTIDE (lan REE oh tide) is used to reduce blood levels of growth hormone in patients with a condition called acromegaly. It also works to slow or stop tumor growth in patients with neuroendocrine tumors and treat carcinoid syndrome. This medicine may be used for other purposes; ask your health care provider or pharmacist if you have questions. COMMON BRAND NAME(S): Somatuline Depot What should I tell my care team before I take this medication? They need to know if you have any of these conditions: diabetes gallbladder disease heart disease kidney disease liver disease thyroid disease an unusual or allergic reaction to lanreotide, other medicines, foods, dyes, or preservatives pregnant or trying to get pregnant breast-feeding How should I use this medication? This medicine is for injection under the skin. It is given by a health care professional in a hospital or clinic setting. Contact your pediatrician or health care professional regarding the use of this medicine in children. Special care may be needed. Overdosage: If you think you have taken too much of this medicine contact a poison control center or emergency room at once. NOTE: This medicine is only for you. Do not share this medicine with others. What if I miss a dose? It is important not to miss your dose. Call your doctor or health care professional if you are unable to keep an appointment. What may interact with this medication? This medicine may interact with the following medications: bromocriptine cyclosporine certain medicines for blood pressure, heart disease, irregular heart beat certain medicines for diabetes quinidine terfenadine This list may not describe all possible interactions. Give your health care provider a list of all the medicines, herbs, non-prescription drugs, or dietary supplements you use. Also tell them if you smoke, drink alcohol, or use illegal drugs.  Some items may interact with your medicine. What should I watch for while using this medication? Tell your doctor or healthcare professional if your symptoms do not start to get better or if they get worse. Visit your doctor or health care professional for regular checks on your progress. Your condition will be monitored carefully while you are receiving this medicine. This medicine may increase blood sugar. Ask your healthcare provider if changes in diet or medicines are needed if you have diabetes. You may need blood work done while you are taking this medicine. Women should inform their doctor if they wish to become pregnant or think they might be pregnant. There is a potential for serious side effects to an unborn child. Talk to your health care professional or pharmacist for more information. Do not breast-feed an infant while taking this medicine or for 6 months after stopping it. This medicine has caused ovarian failure in some women. This medicine may interfere with the ability to have a child. Talk with your doctor or health care professional if you are concerned about your fertility. What side effects may I notice from receiving this medication? Side effects that you should report to your doctor or health care professional as soon as possible: allergic reactions like skin rash, itching or hives, swelling of the face, lips, or tongue increased blood pressure severe stomach pain signs and symptoms of hgh blood sugar such as being more thirsty or hungry or having to urinate more than normal. You may also feel very tired or have blurry vision. signs and symptoms of low blood sugar such as feeling anxious; confusion; dizziness; increased hunger; unusually weak or tired; sweating; shakiness; cold; irritable; headache; blurred vision; fast   heartbeat; loss of consciousness unusually slow heartbeat Side effects that usually do not require medical attention (report to your doctor or health care  professional if they continue or are bothersome): constipation diarrhea dizziness headache muscle pain muscle spasms nausea pain, redness, or irritation at site where injected This list may not describe all possible side effects. Call your doctor for medical advice about side effects. You may report side effects to FDA at 1-800-FDA-1088. Where should I keep my medication? This drug is given in a hospital or clinic and will not be stored at home. NOTE: This sheet is a summary. It may not cover all possible information. If you have questions about this medicine, talk to your doctor, pharmacist, or health care provider.  2022 Elsevier/Gold Standard (2017-10-06 09:13:08)  

## 2020-07-15 NOTE — Progress Notes (Signed)
   Covid-19 Vaccination Clinic  Name:  VALOR TURBERVILLE    MRN: [id number] DOB: [birth year]  07/15/2020  Ms. Rarick was observed post Covid-19 immunization for 15 minutes without incident. She was provided with Vaccine Information Sheet and instruction to access the V-Safe system.   Ms. Aughenbaugh was instructed to call 911 with any severe reactions post vaccine: Difficulty breathing  Swelling of face and throat  A fast heartbeat  A bad rash all over body  Dizziness and weakness   Immunizations Administered     Name Date Dose VIS Date Route   PFIZER Comrnaty(Gray TOP) Covid-19 Vaccine 07/15/2020 11:20 AM 0.3 mL 12/20/2019 Intramuscular   Manufacturer: [place]   Lot: [id number]   [place]: [phone]

## 2020-07-15 NOTE — Progress Notes (Signed)
  [facility] OFFICE PROGRESS NOTE   Diagnosis: Carcinoid tumor  INTERVAL HISTORY:   Sally Brown returns as scheduled.  She continues monthly lanreotide.  No flushing episodes.  No diarrhea.  She denies abdominal pain.  Left heel pain resolved following an injection.  Objective:  Vital signs in last 24 hours:  Blood pressure 126/86, pulse 72, temperature 98.4 F (36.9 C), temperature source Oral, resp. rate 18, height 5\' 2"  (1.575 m), weight 172 lb 9.6 oz (78.3 kg), last menstrual period 05/17/2017, SpO2 99 %.    Resp: Lungs clear bilaterally. Cardio: Regular rate and rhythm. GI: Abdomen soft and nontender.  No hepatomegaly.  No mass. Vascular: No leg edema.   Lab Results:  Lab Results  Component Value Date   WBC 4.4 04/22/2020   HGB 11.8 (L) 04/22/2020   HCT 35.0 (L) 04/22/2020   MCV 93.3 04/22/2020   PLT 189 04/22/2020   NEUTROABS 2.6 04/22/2020    Imaging:  No results found.  Medications: I have reviewed the patient's current medications.  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   06/07/2018 MRI of the liver-numerous hepatic lesions scattered throughout all aspects of the liver.  06/14/2018 PET scan-enlarged and hypermetabolic perihepatic and portacaval lymph nodes; diffuse heterogeneous uptake within the liver with subtle areas of mildly increased FDG uptake corresponding to suspicious lesions identified on MRI.   06/21/2018 biopsy of a right liver lobe mass-low-grade neuroendocrine tumor consistent with metastasis, positive CD56, chromogranin, synaptophysin, CDX-2 and cytokeratin AE1/AE3. 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  Single focus of uptake in the proximal right humerus.  No clear primary  tumor identified. Normal chromogranin A 07/05/2018 and normal 24-hour urine 5 HIAA 08/03/2018 Monthly lanreotide beginning 08/03/2018 CT 12/15/2018-small hepatic metastases, mildly improved, slight increase in size of a porta hepatis node CT abdomen/pelvis 07/02/2019-some of the liver lesions are slightly larger, others stable or smaller, slight increase in splenic lesions, minimal increase in abdominal adenopathy Cycle 1 Lutathera 08/14/2019 Cycle 2 Lutathera 10/08/2019 Cycle 3 Lutathera 12/03/2019 Cycle 4 Lutathera 01/29/2020 Netspot 03/11/2020-marked reduction in activity within hepatic metastases with only 1 remaining measurable lesion, decrease in size and radiotracer activity involving periportal and peripancreatic lymph nodes, decreased radiotracer activity at a subcarinal node and resolution of activity at a left supraclavicular node, resolution of focal metabolic activity in the right humerus, near complete resolution of activity in the left T3 transverse process, decreased activity involving a right acetabular lesion, no evidence of new or progressive disease Monthly lanreotide continued 3.   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 Lindi Adie 4.   Hypercholesterolemia 5.   Nausea and vomiting following cycle 1 Lutathera    Disposition: [name] appears well.  There is no clinical evidence of disease progression.  She will continue monthly lanreotide.  We will follow-up on the chromogranin A level from today.  She will return for lab and follow-up in 3 months.    Ned Card ANP/GNP-BC   07/15/2020  10:26 AM

## 2020-07-16 LAB — CHROMOGRANIN A: Chromogranin A (ng/mL): 28.1 ng/mL (ref 0.0–101.8)

## 2020-08-19 ENCOUNTER — Inpatient Hospital Stay: Payer: No Typology Code available for payment source | Attending: Nurse Practitioner

## 2020-08-19 ENCOUNTER — Other Ambulatory Visit: Payer: Self-pay

## 2020-08-19 DIAGNOSIS — C7A8 Other malignant neuroendocrine tumors: Secondary | ICD-10-CM

## 2020-08-19 DIAGNOSIS — Z79899 Other long term (current) drug therapy: Secondary | ICD-10-CM | POA: Diagnosis not present

## 2020-08-19 DIAGNOSIS — C7A098 Malignant carcinoid tumors of other sites: Secondary | ICD-10-CM | POA: Insufficient documentation

## 2020-08-19 DIAGNOSIS — C7B8 Other secondary neuroendocrine tumors: Secondary | ICD-10-CM

## 2020-08-19 MED ORDER — LANREOTIDE ACETATE 120 MG/0.5ML ~~LOC~~ SOLN
120.0000 mg | Freq: Once | SUBCUTANEOUS | Status: AC
Start: 2020-08-19 — End: 2020-08-19
  Administered 2020-08-19: 120 mg via SUBCUTANEOUS

## 2020-08-19 NOTE — Patient Instructions (Signed)
[facility]  Discharge Instructions: Thank you for choosing [facility] to provide your oncology and hematology care.   If you have a lab appointment with the [facility], please go directly to the [facility] and check in at the registration area.   Wear comfortable clothing and clothing appropriate for easy access to any Portacath or PICC line.   We strive to give you quality time with your provider. You may need to reschedule your appointment if you arrive late (15 or more minutes).  Arriving late affects you and other patients whose appointments are after yours.  Also, if you miss three or more appointments without notifying the office, you may be dismissed from the clinic at the provider's discretion.      For prescription refill requests, have your pharmacy contact our office and allow 72 hours for refills to be completed.    Today you received the following medication: Lanreotide   To help prevent nausea and vomiting after your treatment, we encourage you to take your nausea medication as directed.  BELOW ARE SYMPTOMS THAT SHOULD BE REPORTED IMMEDIATELY: *FEVER GREATER THAN 100.4 F (38 C) OR HIGHER *CHILLS OR SWEATING *NAUSEA AND VOMITING THAT IS NOT CONTROLLED WITH YOUR NAUSEA MEDICATION *UNUSUAL SHORTNESS OF BREATH *UNUSUAL BRUISING OR BLEEDING *URINARY PROBLEMS (pain or burning when urinating, or frequent urination) *BOWEL PROBLEMS (unusual diarrhea, constipation, pain near the anus) TENDERNESS IN MOUTH AND THROAT WITH OR WITHOUT PRESENCE OF ULCERS (sore throat, sores in mouth, or a toothache) UNUSUAL RASH, SWELLING OR PAIN  UNUSUAL VAGINAL DISCHARGE OR ITCHING   Items with * indicate a potential emergency and should be followed up as soon as possible or go to the Emergency Department if any problems should occur.  Please show the CHEMOTHERAPY ALERT CARD or IMMUNOTHERAPY ALERT CARD at check-in to the Emergency Department and triage  nurse.  Should you have questions after your visit or need to cancel or reschedule your appointment, please contact [facility]  Dept: [phone]  and follow the prompts.  Office hours are 8:00 a.m. to 4:30 p.m. Monday - Friday. Please note that voicemails left after 4:00 p.m. may not be returned until the following business day.  We are closed weekends and major holidays. You have access to a nurse at all times for urgent questions. Please call the main number to the clinic Dept: [phone] and follow the prompts.   For any non-urgent questions, you may also contact your provider using MyChart. We now offer e-Visits for anyone 77 and older to request care online for non-urgent symptoms. For details visit [email or web address].   Also download the MyChart app! Go to the app store, search "MyChart", open the app, select [facility], and log in with your MyChart username and password.  Due to Covid, a mask is required upon entering the hospital/clinic. If you do not have a mask, one will be given to you upon arrival. For doctor visits, patients may have 1 support person aged 41 or older with them. For treatment visits, patients cannot have anyone with them due to current Covid guidelines and our immunocompromised population.   Lanreotide injection What is this medication? LANREOTIDE (lan REE oh tide) is used to reduce blood levels of growth hormone in patients with a condition called acromegaly. It also works to slow or stop tumor growth in patients with neuroendocrine tumors and treat carcinoidsyndrome. This medicine may be used for other purposes; ask your health  care provider orpharmacist if you have questions. COMMON BRAND NAME(S): Somatuline Depot What should I tell my care team before I take this medication? They need to know if you have any of these conditions: diabetes gallbladder disease heart disease kidney disease liver disease thyroid disease an  unusual or allergic reaction to lanreotide, other medicines, foods, dyes, or preservatives pregnant or trying to get pregnant breast-feeding How should I use this medication? This medicine is for injection under the skin. It is given by a health careprofessional in a hospital or clinic setting. Contact your pediatrician or health care professional regarding the use of thismedicine in children. Special care may be needed. Overdosage: If you think you have taken too much of this medicine contact apoison control center or emergency room at once. NOTE: This medicine is only for you. Do not share this medicine with others. What if I miss a dose? It is important not to miss your dose. Call your doctor or health careprofessional if you are unable to keep an appointment. What may interact with this medication? This medicine may interact with the following medications: bromocriptine cyclosporine certain medicines for blood pressure, heart disease, irregular heart beat certain medicines for diabetes quinidine terfenadine This list may not describe all possible interactions. Give your health care provider a list of all the medicines, herbs, non-prescription drugs, or dietary supplements you use. Also tell them if you smoke, drink alcohol, or use illegaldrugs. Some items may interact with your medicine. What should I watch for while using this medication? Tell your doctor or healthcare professional if your symptoms do not start toget better or if they get worse. Visit your doctor or health care professional for regular checks on your progress. Your condition will be monitored carefully while you are receivingthis medicine. This medicine may increase blood sugar. Ask your healthcare provider if changesin diet or medicines are needed if you have diabetes. You may need blood work done while you are taking this medicine. Women should inform their doctor if they wish to become pregnant or think they might be  pregnant. There is a potential for serious side effects to an unborn child. Talk to your health care professional or pharmacist for more information. Do not breast-feed an infant while taking this medicine or for 46months after stopping it. This medicine has caused ovarian failure in some women. This medicine may interfere with the ability to have a child. Talk with your doctor or healthcare professional if you are concerned about your fertility. What side effects may I notice from receiving this medication? Side effects that you should report to your doctor or health care professionalas soon as possible: allergic reactions like skin rash, itching or hives, swelling of the face, lips, or tongue increased blood pressure severe stomach pain signs and symptoms of hgh blood sugar such as being more thirsty or hungry or having to urinate more than normal. You may also feel very tired or have blurry vision. signs and symptoms of low blood sugar such as feeling anxious; confusion; dizziness; increased hunger; unusually weak or tired; sweating; shakiness; cold; irritable; headache; blurred vision; fast heartbeat; loss of consciousness unusually slow heartbeat Side effects that usually do not require medical attention (report to yourdoctor or health care professional if they continue or are bothersome): constipation diarrhea dizziness headache muscle pain muscle spasms nausea pain, redness, or irritation at site where injected This list may not describe all possible side effects. Call your doctor for medical advice about side effects. You may report  side effects to FDA at1-800-FDA-1088. Where should I keep my medication? This drug is given in a hospital or clinic and will not be stored at home. NOTE: This sheet is a summary. It may not cover all possible information. If you have questions about this medicine, talk to your doctor, pharmacist, orhealth care provider.  2022 Elsevier/Gold Standard  (2017-10-06 09:13:08)

## 2020-08-25 ENCOUNTER — Encounter (HOSPITAL_BASED_OUTPATIENT_CLINIC_OR_DEPARTMENT_OTHER): Payer: Self-pay | Admitting: General Surgery

## 2020-08-25 ENCOUNTER — Other Ambulatory Visit: Payer: Self-pay

## 2020-08-27 ENCOUNTER — Other Ambulatory Visit: Payer: Self-pay | Admitting: General Surgery

## 2020-09-01 NOTE — Progress Notes (Signed)
Patient called and inquired about covid testing if exposure from family member. Patient denies symptoms. Instructed pt to obtain PCR test and bring results with her on day of surgery. ( Pt works in a [employer] office) Instructed pt if symptoms appear to call and postpone surgery. Pt verbalized understanding.

## 2020-09-02 ENCOUNTER — Encounter (HOSPITAL_BASED_OUTPATIENT_CLINIC_OR_DEPARTMENT_OTHER)
Admission: RE | Disposition: A | Discharge status: Home / Self Care | Payer: Self-pay | Source: Home / Self Care | Attending: General Surgery

## 2020-09-02 ENCOUNTER — Encounter (HOSPITAL_BASED_OUTPATIENT_CLINIC_OR_DEPARTMENT_OTHER): Payer: Self-pay | Admitting: General Surgery

## 2020-09-02 ENCOUNTER — Ambulatory Visit (HOSPITAL_BASED_OUTPATIENT_CLINIC_OR_DEPARTMENT_OTHER)
Admission: RE | Admit: 2020-09-02 | Discharge: 2020-09-02 | Disposition: A | Discharge status: Home / Self Care | Payer: No Typology Code available for payment source | Attending: General Surgery | Admitting: General Surgery

## 2020-09-02 ENCOUNTER — Other Ambulatory Visit: Payer: Self-pay

## 2020-09-02 ENCOUNTER — Ambulatory Visit (HOSPITAL_BASED_OUTPATIENT_CLINIC_OR_DEPARTMENT_OTHER): Payer: No Typology Code available for payment source | Admitting: Anesthesiology

## 2020-09-02 DIAGNOSIS — D1779 Benign lipomatous neoplasm of other sites: Secondary | ICD-10-CM | POA: Diagnosis present

## 2020-09-02 DIAGNOSIS — Z79899 Other long term (current) drug therapy: Secondary | ICD-10-CM | POA: Diagnosis not present

## 2020-09-02 DIAGNOSIS — Z882 Allergy status to sulfonamides status: Secondary | ICD-10-CM | POA: Insufficient documentation

## 2020-09-02 DIAGNOSIS — Z885 Allergy status to narcotic agent status: Secondary | ICD-10-CM | POA: Diagnosis not present

## 2020-09-02 DIAGNOSIS — K620 Anal polyp: Secondary | ICD-10-CM | POA: Diagnosis not present

## 2020-09-02 HISTORY — PX: ANAL FISSURE REPAIR: SHX2312

## 2020-09-02 HISTORY — PX: LIPOMA EXCISION: SHX5283

## 2020-09-02 SURGERY — EXCISION LIPOMA
Anesthesia: General | Site: Rectum | Laterality: Right

## 2020-09-02 MED ORDER — MIDAZOLAM HCL 2 MG/2ML IJ SOLN
INTRAMUSCULAR | Status: AC
  Filled 2020-09-02: qty 2

## 2020-09-02 MED ORDER — BUPIVACAINE HCL (PF) 0.25 % IJ SOLN
INTRAMUSCULAR | Status: DC | PRN
  Administered 2020-09-02: 9 mL

## 2020-09-02 MED ORDER — KETOROLAC TROMETHAMINE 15 MG/ML IJ SOLN
INTRAMUSCULAR | Status: AC
  Filled 2020-09-02: qty 1

## 2020-09-02 MED ORDER — LACTATED RINGERS IV SOLN: INTRAVENOUS | Status: DC

## 2020-09-02 MED ORDER — OXYCODONE HCL 5 MG/5ML PO SOLN: 5.0000 mg | Freq: Once | ORAL | Status: DC | PRN

## 2020-09-02 MED ORDER — ENSURE PRE-SURGERY PO LIQD: 296.0000 mL | Freq: Once | ORAL | Status: DC

## 2020-09-02 MED ORDER — LIDOCAINE HCL (PF) 2 % IJ SOLN
INTRAMUSCULAR | Status: AC
  Filled 2020-09-02: qty 5

## 2020-09-02 MED ORDER — FENTANYL CITRATE (PF) 100 MCG/2ML IJ SOLN
INTRAMUSCULAR | Status: DC | PRN
  Administered 2020-09-02: 100 ug via INTRAVENOUS

## 2020-09-02 MED ORDER — PROPOFOL 10 MG/ML IV BOLUS
INTRAVENOUS | Status: DC | PRN
  Administered 2020-09-02: 150 mg via INTRAVENOUS

## 2020-09-02 MED ORDER — MEPERIDINE HCL 25 MG/ML IJ SOLN: 6.2500 mg | INTRAMUSCULAR | Status: DC | PRN

## 2020-09-02 MED ORDER — LIDOCAINE HCL (CARDIAC) PF 100 MG/5ML IV SOSY
PREFILLED_SYRINGE | INTRAVENOUS | Status: DC | PRN
Start: 2020-09-02 — End: 2020-09-02
  Administered 2020-09-02: 60 mg via INTRAVENOUS

## 2020-09-02 MED ORDER — ONDANSETRON HCL 4 MG/2ML IJ SOLN
INTRAMUSCULAR | Status: DC | PRN
  Administered 2020-09-02: 4 mg via INTRAVENOUS

## 2020-09-02 MED ORDER — PHENYLEPHRINE 40 MCG/ML (10ML) SYRINGE FOR IV PUSH (FOR BLOOD PRESSURE SUPPORT)
PREFILLED_SYRINGE | INTRAVENOUS | Status: AC
  Filled 2020-09-02: qty 10

## 2020-09-02 MED ORDER — DEXAMETHASONE SODIUM PHOSPHATE 4 MG/ML IJ SOLN
INTRAMUSCULAR | Status: DC | PRN
  Administered 2020-09-02: 10 mg via INTRAVENOUS

## 2020-09-02 MED ORDER — DROPERIDOL 2.5 MG/ML IJ SOLN
INTRAMUSCULAR | Status: DC | PRN
  Administered 2020-09-02: .625 mg via INTRAVENOUS

## 2020-09-02 MED ORDER — OXYCODONE HCL 5 MG PO TABS: 5.0000 mg | ORAL_TABLET | Freq: Once | ORAL | Status: DC | PRN

## 2020-09-02 MED ORDER — CHLORHEXIDINE GLUCONATE CLOTH 2 % EX PADS: 6.0000 | MEDICATED_PAD | Freq: Once | CUTANEOUS | Status: DC

## 2020-09-02 MED ORDER — DROPERIDOL 2.5 MG/ML IJ SOLN
INTRAMUSCULAR | Status: AC
  Filled 2020-09-02: qty 2

## 2020-09-02 MED ORDER — KETOROLAC TROMETHAMINE 15 MG/ML IJ SOLN
15.0000 mg | INTRAMUSCULAR | Status: AC
  Administered 2020-09-02: 15 mg via INTRAVENOUS

## 2020-09-02 MED ORDER — ONDANSETRON HCL 4 MG/2ML IJ SOLN
INTRAMUSCULAR | Status: AC
  Filled 2020-09-02: qty 2

## 2020-09-02 MED ORDER — PROMETHAZINE HCL 25 MG/ML IJ SOLN: 6.2500 mg | INTRAMUSCULAR | Status: DC | PRN

## 2020-09-02 MED ORDER — HYDROMORPHONE HCL 1 MG/ML IJ SOLN: 0.2500 mg | INTRAMUSCULAR | Status: DC | PRN

## 2020-09-02 MED ORDER — DEXAMETHASONE SODIUM PHOSPHATE 10 MG/ML IJ SOLN
INTRAMUSCULAR | Status: AC
  Filled 2020-09-02: qty 1

## 2020-09-02 MED ORDER — ACETAMINOPHEN 500 MG PO TABS
ORAL_TABLET | ORAL | Status: AC
  Filled 2020-09-02: qty 2

## 2020-09-02 MED ORDER — CEFAZOLIN SODIUM-DEXTROSE 2-4 GM/100ML-% IV SOLN
2.0000 g | INTRAVENOUS | Status: AC
  Administered 2020-09-02: 2 g via INTRAVENOUS

## 2020-09-02 MED ORDER — GLYCOPYRROLATE PF 0.2 MG/ML IJ SOSY
PREFILLED_SYRINGE | INTRAMUSCULAR | Status: AC
  Filled 2020-09-02: qty 1

## 2020-09-02 MED ORDER — CEFAZOLIN SODIUM-DEXTROSE 2-4 GM/100ML-% IV SOLN
INTRAVENOUS | Status: AC
  Filled 2020-09-02: qty 100

## 2020-09-02 MED ORDER — MIDAZOLAM HCL 5 MG/5ML IJ SOLN
INTRAMUSCULAR | Status: DC | PRN
  Administered 2020-09-02: 2 mg via INTRAVENOUS

## 2020-09-02 MED ORDER — ACETAMINOPHEN 500 MG PO TABS
1000.0000 mg | ORAL_TABLET | ORAL | Status: AC
  Administered 2020-09-02: 1000 mg via ORAL

## 2020-09-02 MED ORDER — ROCURONIUM BROMIDE 100 MG/10ML IV SOLN
INTRAVENOUS | Status: DC | PRN
  Administered 2020-09-02: 80 mg via INTRAVENOUS

## 2020-09-02 MED ORDER — FENTANYL CITRATE (PF) 100 MCG/2ML IJ SOLN
INTRAMUSCULAR | Status: AC
  Filled 2020-09-02: qty 2

## 2020-09-02 MED ORDER — SUGAMMADEX SODIUM 200 MG/2ML IV SOLN
INTRAVENOUS | Status: DC | PRN
  Administered 2020-09-02: 200 mg via INTRAVENOUS

## 2020-09-02 SURGICAL SUPPLY — 41 items
ADH SKN CLS APL DERMABOND .7 (GAUZE/BANDAGES/DRESSINGS) ×2
APL SKNCLS STERI-STRIP NONHPOA (GAUZE/BANDAGES/DRESSINGS) ×2
BENZOIN TINCTURE PRP APPL 2/3 (GAUZE/BANDAGES/DRESSINGS) ×3 IMPLANT
BLADE CLIPPER SURG (BLADE) ×1 IMPLANT
BLADE SURG 15 STRL LF DISP TIS (BLADE) ×2 IMPLANT
BLADE SURG 15 STRL SS (BLADE) ×3
BRIEF STRETCH MATERNITY 2XLG (MISCELLANEOUS) ×1 IMPLANT
COVER BACK TABLE 60X90IN (DRAPES) ×3 IMPLANT
COVER MAYO STAND STRL (DRAPES) ×3 IMPLANT
DERMABOND ADVANCED (GAUZE/BANDAGES/DRESSINGS) ×1
DERMABOND ADVANCED .7 DNX12 (GAUZE/BANDAGES/DRESSINGS) ×2 IMPLANT
DRAPE LAPAROTOMY 100X72 PEDS (DRAPES) ×3 IMPLANT
DRSG PAD ABDOMINAL 8X10 ST (GAUZE/BANDAGES/DRESSINGS) ×3 IMPLANT
ELECT COATED BLADE 2.86 ST (ELECTRODE) ×3 IMPLANT
ELECT REM PT RETURN 9FT ADLT (ELECTROSURGICAL) ×3
ELECTRODE REM PT RTRN 9FT ADLT (ELECTROSURGICAL) ×2 IMPLANT
GAUZE SPONGE 4X4 12PLY STRL LF (GAUZE/BANDAGES/DRESSINGS) ×3 IMPLANT
GLOVE SURG ENC MOIS LTX SZ7 (GLOVE) ×3 IMPLANT
GLOVE SURG UNDER POLY LF SZ7 (GLOVE) ×1 IMPLANT
GLOVE SURG UNDER POLY LF SZ7.5 (GLOVE) ×3 IMPLANT
GOWN STRL REUS W/ TWL LRG LVL3 (GOWN DISPOSABLE) ×6 IMPLANT
GOWN STRL REUS W/TWL LRG LVL3 (GOWN DISPOSABLE) ×9
NDL HYPO 25X1 1.5 SAFETY (NEEDLE) ×2 IMPLANT
NEEDLE HYPO 25X1 1.5 SAFETY (NEEDLE) ×3 IMPLANT
NS IRRIG 1000ML POUR BTL (IV SOLUTION) ×1 IMPLANT
PACK BASIN DAY SURGERY FS (CUSTOM PROCEDURE TRAY) ×3 IMPLANT
PENCIL SMOKE EVACUATOR (MISCELLANEOUS) ×3 IMPLANT
SLEEVE SCD COMPRESS KNEE MED (STOCKING) ×3 IMPLANT
SPONGE SURGIFOAM ABS GEL 100 (HEMOSTASIS) ×1 IMPLANT
SURGILUBE 2OZ TUBE FLIPTOP (MISCELLANEOUS) ×3 IMPLANT
SUT CHROMIC 3 0 SH 27 (SUTURE) ×1 IMPLANT
SUT ETHILON 3 0 PS 1 (SUTURE) ×1 IMPLANT
SUT MNCRL AB 4-0 PS2 18 (SUTURE) ×3 IMPLANT
SUT VIC AB 3-0 SH 27 (SUTURE) ×6
SUT VIC AB 3-0 SH 27X BRD (SUTURE) IMPLANT
SYR CONTROL 10ML LL (SYRINGE) ×3 IMPLANT
TAPE CLOTH 3X10 TAN LF (GAUZE/BANDAGES/DRESSINGS) ×3 IMPLANT
TAPE CLOTH SURG 6X10 WHT LF (GAUZE/BANDAGES/DRESSINGS) ×2 IMPLANT
TOWEL GREEN STERILE FF (TOWEL DISPOSABLE) ×5 IMPLANT
TRAY DSU PREP LF (CUSTOM PROCEDURE TRAY) ×4 IMPLANT
UNDERPAD 30X36 HEAVY ABSORB (UNDERPADS AND DIAPERS) ×3 IMPLANT

## 2020-09-02 NOTE — Anesthesia Postprocedure Evaluation (Signed)
Anesthesia Post Note  Patient: Sally Brown  Procedure(s) Performed: EXCISION LIPOMA RIGHT BUTTOCK (Right: Buttocks) EXCISION ANAL POLYP (Rectum)     Patient location during evaluation: PACU Anesthesia Type: General Level of consciousness: awake and alert Pain management: pain level controlled Vital Signs Assessment: post-procedure vital signs reviewed and stable Respiratory status: spontaneous breathing, nonlabored ventilation, respiratory function stable and patient connected to nasal cannula oxygen Cardiovascular status: blood pressure returned to baseline and stable Postop Assessment: no apparent nausea or vomiting Anesthetic complications: no   No notable events documented.  Last Vitals:  Vitals:   09/02/20 1315 09/02/20 1327  BP:  114/73  Pulse: 61 60  Resp: 18 16  Temp:  (!) 35.9 C  SpO2: 96% 97%    Last Pain:  Vitals:   09/02/20 1327  TempSrc:   PainSc: 0-No pain                 Belenda Cruise P Emilian Stawicki

## 2020-09-02 NOTE — Discharge Instructions (Addendum)
[facility] _______[facility]   Always review your discharge instruction sheet given to you by the facility where your surgery was performed. IF YOU HAVE DISABILITY OR FAMILY LEAVE FORMS, YOU MUST BRING THEM TO THE OFFICE FOR PROCESSING.   DO NOT GIVE THEM TO YOUR DOCTOR.  A  prescription for pain medication may be given to you upon discharge.  Take your pain medication as prescribed, if needed.  If narcotic pain medicine is not needed, then you may take acetaminophen (Tylenol) or ibuprofen (Advil) as needed. Take your usually prescribed medications unless otherwise directed. If you need a refill on your pain medication, please contact your pharmacy.  They will contact our office to request authorization. Prescriptions will not be filled after 5 pm or on week-ends. You should follow a light diet the first 24 hours after arrival home, such as soup and crackers, etc.  Swelling and discomfort can take several days to resolve.  It is common to experience some constipation if taking pain medication after surgery.  Increasing fluid intake and taking a stool softener (such as Colace) will usually help or prevent this problem from occurring.  A mild laxative (Milk of Magnesia or Miralax) should be taken according to package directions if there are no bowel movements after 48 hours. Unless discharge instructions indicate otherwise, leave your bandage dry and in place for 24 hours, or remove the bandage if you have a bowel movement. You may notice a small amount of bleeding with bowel movements for the first few days. You may have some packing in the rectum which will come out over the first day or two. You will need to wear an absorbent pad or soft cotton gauze in your underwear until the drainage stops.it. ACTIVITIES:  You may resume regular (light) daily activities beginning the next day--such as daily self-care, walking, climbing stairs--gradually increasing activities as tolerated.  You may drive  when you are no longer taking prescription pain medication, you can comfortably wear a seatbelt, and you can safely maneuver your car and apply brakes. RETURN TO WORK: : ____________________  Sally Brown should see your doctor in the office for a follow-up appointment approximately 2-3 weeks after your surgery.  Make sure that you call for this appointment within a day or two after you arrive home to insure a convenient appointment time. OTHER INSTRUCTIONS:  __________________________________________________________________________________________________________________________________________________________________________________________  WHEN TO CALL YOUR DOCTOR: Fever over 101.0 Inability to urinate Nausea and/or vomiting Extreme swelling or bruising Continued bleeding from rectum. Increased pain, redness, or drainage from the incision Constipation  The clinic staff is available to answer your questions during regular business hours.  Please don't hesitate to call and ask to speak to one of the nurses for clinical concerns.  If you have a medical emergency, go to the nearest emergency room or call 911.  A surgeon from [facility] is always on call at the hospital   [address] [place], [place], [place]  [address] ?  [address], [place], [place]   [address] [phone] ? [phone] ? FAX [phone] Web site: [email or web address]    Post Anesthesia Home Care Instructions  Activity: Get plenty of rest for the remainder of the day. A responsible individual must stay with you for 24 hours following the procedure.  For the next 24 hours, DO NOT: -Drive a car -Paediatric nurse -Drink alcoholic beverages -Take any medication unless instructed by your physician -Make any legal decisions or sign important papers.  Meals: Start  with liquid foods such as gelatin or soup. Progress to regular foods as tolerated. Avoid greasy, spicy, heavy foods. If nausea  and/or vomiting occur, drink only clear liquids until the nausea and/or vomiting subsides. Call your physician if vomiting continues.  Special Instructions/Symptoms: Your throat may feel dry or sore from the anesthesia or the breathing tube placed in your throat during surgery. If this causes discomfort, gargle with warm salt water. The discomfort should disappear within 24 hours.  If you had a scopolamine patch placed behind your ear for the management of post- operative nausea and/or vomiting:  1. The medication in the patch is effective for 72 hours, after which it should be removed.  Wrap patch in a tissue and discard in the trash. Wash hands thoroughly with soap and water. 2. You may remove the patch earlier than 72 hours if you experience unpleasant side effects which may include dry mouth, dizziness or visual disturbances. 3. Avoid touching the patch. Wash your hands with soap and water after contact with the patch.      Can take Tylenol after 5:18 for pain as needed.

## 2020-09-02 NOTE — Interval H&P Note (Signed)
History and Physical Interval Note:  09/02/2020 11:14 AM  Sally Brown  has presented today for surgery, with the diagnosis of RIGHT BUTTOCK LIPOMA AND ANAL POLYP.  The various methods of treatment have been discussed with the patient and family. After consideration of risks, benefits and other options for treatment, the patient has consented to  Procedure(s): EXCISION LIPOMA RIGHT BUTTOCK (Right) EXCISION ANAL POLYP (N/A) as a surgical intervention.  The patient's history has been reviewed, patient examined, no change in status, stable for surgery.  I have reviewed the patient's chart and labs.  Questions were answered to the patient's satisfaction.     Rolm Bookbinder

## 2020-09-02 NOTE — Anesthesia Preprocedure Evaluation (Signed)
Anesthesia Evaluation  Patient identified by MRN, date of birth, ID band Patient awake    Reviewed: Allergy & Precautions, NPO status , Patient's Chart, lab work & pertinent test results  History of Anesthesia Complications (+) PONV  Airway Mallampati: II  TM Distance: >3 FB Neck ROM: Full    Dental  (+) Dental Advisory Given   Pulmonary neg pulmonary ROS,    breath sounds clear to auscultation       Cardiovascular negative cardio ROS   Rhythm:Regular Rate:Normal     Neuro/Psych negative neurological ROS     GI/Hepatic negative GI ROS, Neg liver ROS,   Endo/Other  negative endocrine ROS  Renal/GU negative Renal ROS     Musculoskeletal   Abdominal (+) + obese,   Peds  Hematology negative hematology ROS (+)   Anesthesia Other Findings Carcinoid Tumor  Reproductive/Obstetrics                             Lab Results  Component Value Date   WBC 5.5 07/15/2020   HGB 12.1 07/15/2020   HCT 36.0 07/15/2020   MCV 94.7 07/15/2020   PLT 193 07/15/2020   Lab Results  Component Value Date   CREATININE 0.71 07/15/2020   BUN 12 07/15/2020   NA 142 07/15/2020   K 4.3 07/15/2020   CL 105 07/15/2020   CO2 28 07/15/2020    Anesthesia Physical  Anesthesia Plan  ASA: 3  Anesthesia Plan: General   Post-op Pain Management:    Induction: Intravenous  PONV Risk Score and Plan: 4 or greater and Midazolam, Dexamethasone, Ondansetron, Treatment may vary due to age or medical condition and Droperidol  Airway Management Planned: Oral ETT  Additional Equipment:   Intra-op Plan:   Post-operative Plan: Extubation in OR  Informed Consent: I have reviewed the patients History and Physical, chart, labs and discussed the procedure including the risks, benefits and alternatives for the proposed anesthesia with the patient or authorized representative who has indicated his/her understanding and  acceptance.     Dental advisory given  Plan Discussed with: CRNA  Anesthesia Plan Comments:         Anesthesia Quick Evaluation

## 2020-09-02 NOTE — Transfer of Care (Signed)
Immediate Anesthesia Transfer of Care Note  Patient: Sally Brown  Procedure(s) Performed: EXCISION LIPOMA RIGHT BUTTOCK (Right: Buttocks) EXCISION ANAL POLYP (Rectum)  Patient Location: PACU  Anesthesia Type:General  Level of Consciousness: drowsy  Airway & Oxygen Therapy: Patient Spontanous Breathing and Patient connected to face mask oxygen  Post-op Assessment: Report given to RN, Post -op Vital signs reviewed and stable and Patient moving all extremities X 4  Post vital signs: Reviewed and stable  Last Vitals:  Vitals Value Taken Time  BP 110/65 09/02/20 1238  Temp    Pulse 62 09/02/20 1241  Resp 13 09/02/20 1241  SpO2 100 % 09/02/20 1241  Vitals shown include unvalidated device data.  Last Pain:  Vitals:   09/02/20 1059  TempSrc: Oral  PainSc: 0-No pain         Complications: No notable events documented.

## 2020-09-02 NOTE — H&P (Signed)
  55 y.o. female who is here today for lipoma in buttock that is increasing in size and causing issues. She also has history of rectal polyp as well that has been biopsied. She does note some tissue at anus that is bothering her as well and difficult with hygiene. She has undergone a colonoscopy previously and this was biopsied and was benign.  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  Review of Systems: A complete review of systems was obtained from the patient. I have reviewed this information and discussed as appropriate with the patient. See HPI as well for other ROS.  Review of Systems  Gastrointestinal: Negative for blood in stool.  All other systems reviewed and are negative.   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   No family history on file.   Social History   Tobacco Use  Smoking Status Never Smoker  Smokeless Tobacco Never Used    Social History   Socioeconomic History   Marital status: Single  Occupational History   Occupation: CMA  Tobacco Use   Smoking status: Never Smoker   Smokeless tobacco: Never Used  Scientific laboratory technician Use: Never used  Substance and Sexual Activity    Alcohol use: Not Currently   Drug use: Never   Sexual activity: Yes   Objective:  Physical Exam Constitutional:  Appearance: Normal appearance.  Genitourinary: Comments: On the right buttock near the top of the gluteal cleft there is a 4 x 4 centimeter subcutaneous mass consistent with a lipoma On anorectal exam anteriorly there is a 4 mm raised polyp that it looks irritated Neurological:  Mental Status: She is alert.     Assessment and Plan:   Lipoma of other specified sites  Anal polyp  We discussed excision of the lipoma under anesthesia.  Discussed risk of a seroma. We also discussed a local excision of the anal polyp that is visible. This is symptomatic to her and by appearance it looks like it should be excised as well.

## 2020-09-02 NOTE — Op Note (Signed)
Preoperative diagnosis: 1.  Anal polyp 2.  Right buttocks lipoma, 4 x 3 cm, subcutaneous Postoperative diagnosis: Same as above Procedure: 1.  Excision of anal polyp 2.  Excision of right buttocks lipoma Surgeon: Dr. Serita Grammes Anesthesia: General with a perianal block Estimated blood loss: Minimal Complications: None Drains: None Specimens: 1.  Right buttocks lipoma and 2 pieces 2.  Anal polyp Sponge and count was correct completion Disposition to recovery stable condition  Indications: 55 y.o. female who is here today for lipoma in buttock that is increasing in size and causing issues. She also has history of rectal polyp as well that has been biopsied. She does note some tissue at anus that is bothering her as well and difficult with hygiene. She has undergone a colonoscopy previously and this was biopsied and was benign.  We discussed excision of both the anal polyp that was visible externally as well as the lipoma.  Procedure: After informed consent was obtained, she was given antibiotics. SCDs were in place.  She was placed under general anesthesia without complication.  She was rolled into the prone position and appropriately padded.  I then taped her buttocks apart.  She was prepped and draped in a standard sterile surgical fashion.  A surgical timeout was then performed.  I made an elliptical incision after infiltrating Marcaine above the lipoma on her right buttock near the gluteal cleft.  I remove the paddle of skin that was stretched out.  I then proceeded to remove the lipoma in its entirety.  This was sent as a specimen.  I then closed the deep tissue with 3-0 Vicryl.  The skin was closed with 3-0 Vicryl for Monocryl.  She does have a couple of external 3-0 nylon sutures that would be need to be removed in about 10 to 14 days.  I then inserted a anoscope.  The anal polyp was really at visible externally.  I then used a combination of some cautery and then just remove this  sharply with some scissors.  I placed a couple of 3-0 chromic sutures.  I then placed some Gelfoam overlying this area.  She tolerated this well was rolled supine and extubated.  She was then transferred to recovery in stable condition.

## 2020-09-02 NOTE — Anesthesia Procedure Notes (Signed)
Procedure Name: Intubation Date/Time: 09/02/2020 11:45 AM Performed by: Ezequiel Kayser, CRNA Pre-anesthesia Checklist: Patient identified, Emergency Drugs available, Suction available and Patient being monitored Patient Re-evaluated:Patient Re-evaluated prior to induction Oxygen Delivery Method: Circle System Utilized Preoxygenation: Pre-oxygenation with 100% oxygen Induction Type: IV induction Ventilation: Mask ventilation without difficulty Laryngoscope Size: Mac and 3 Grade View: Grade I Tube type: Oral Tube size: 7.0 mm Number of attempts: 1 Airway Equipment and Method: Stylet and Oral airway Placement Confirmation: ETT inserted through vocal cords under direct vision, positive ETCO2 and breath sounds checked- equal and bilateral Secured at: 21 cm Tube secured with: Tape Dental Injury: Teeth and Oropharynx as per pre-operative assessment

## 2020-09-03 ENCOUNTER — Encounter (HOSPITAL_BASED_OUTPATIENT_CLINIC_OR_DEPARTMENT_OTHER): Payer: Self-pay | Admitting: General Surgery

## 2020-09-03 LAB — SURGICAL PATHOLOGY

## 2020-09-08 ENCOUNTER — Encounter: Payer: Self-pay | Admitting: Oncology

## 2020-09-23 ENCOUNTER — Other Ambulatory Visit: Payer: Self-pay

## 2020-09-23 ENCOUNTER — Inpatient Hospital Stay: Payer: No Typology Code available for payment source | Attending: Nurse Practitioner

## 2020-09-23 VITALS — BP 113/71 | HR 61 | Temp 98.1°F | Resp 18

## 2020-09-23 DIAGNOSIS — C7A098 Malignant carcinoid tumors of other sites: Secondary | ICD-10-CM | POA: Insufficient documentation

## 2020-09-23 DIAGNOSIS — C7B8 Other secondary neuroendocrine tumors: Secondary | ICD-10-CM

## 2020-09-23 DIAGNOSIS — Z79899 Other long term (current) drug therapy: Secondary | ICD-10-CM | POA: Insufficient documentation

## 2020-09-23 DIAGNOSIS — C7A8 Other malignant neuroendocrine tumors: Secondary | ICD-10-CM

## 2020-09-23 MED ORDER — LANREOTIDE ACETATE 120 MG/0.5ML ~~LOC~~ SOLN
120.0000 mg | Freq: Once | SUBCUTANEOUS | Status: AC
  Administered 2020-09-23: 120 mg via SUBCUTANEOUS

## 2020-09-23 NOTE — Patient Instructions (Signed)
[facility]  Discharge Instructions: Thank you for choosing [facility] to provide your oncology and hematology care.   If you have a lab appointment with the [facility], please go directly to the [facility] and check in at the registration area.   Wear comfortable clothing and clothing appropriate for easy access to any Portacath or PICC line.   We strive to give you quality time with your provider. You may need to reschedule your appointment if you arrive late (15 or more minutes).  Arriving late affects you and other patients whose appointments are after yours.  Also, if you miss three or more appointments without notifying the office, you may be dismissed from the clinic at the provider's discretion.      For prescription refill requests, have your pharmacy contact our office and allow 72 hours for refills to be completed.    Today you received the following chemotherapy and/or immunotherapy agents lanreotide        To help prevent nausea and vomiting after your treatment, we encourage you to take your nausea medication as directed.  BELOW ARE SYMPTOMS THAT SHOULD BE REPORTED IMMEDIATELY: *FEVER GREATER THAN 100.4 F (38 C) OR HIGHER *CHILLS OR SWEATING *NAUSEA AND VOMITING THAT IS NOT CONTROLLED WITH YOUR NAUSEA MEDICATION *UNUSUAL SHORTNESS OF BREATH *UNUSUAL BRUISING OR BLEEDING *URINARY PROBLEMS (pain or burning when urinating, or frequent urination) *BOWEL PROBLEMS (unusual diarrhea, constipation, pain near the anus) TENDERNESS IN MOUTH AND THROAT WITH OR WITHOUT PRESENCE OF ULCERS (sore throat, sores in mouth, or a toothache) UNUSUAL RASH, SWELLING OR PAIN  UNUSUAL VAGINAL DISCHARGE OR ITCHING   Items with * indicate a potential emergency and should be followed up as soon as possible or go to the Emergency Department if any problems should occur.  Please show the CHEMOTHERAPY ALERT CARD or IMMUNOTHERAPY ALERT CARD at check-in to  the Emergency Department and triage nurse.  Should you have questions after your visit or need to cancel or reschedule your appointment, please contact [facility]  Dept: [phone]  and follow the prompts.  Office hours are 8:00 a.m. to 4:30 p.m. Monday - Friday. Please note that voicemails left after 4:00 p.m. may not be returned until the following business day.  We are closed weekends and major holidays. You have access to a nurse at all times for urgent questions. Please call the main number to the clinic Dept: [phone] and follow the prompts.   For any non-urgent questions, you may also contact your provider using MyChart. We now offer e-Visits for anyone 26 and older to request care online for non-urgent symptoms. For details visit [email or web address].   Also download the MyChart app! Go to the app store, search "MyChart", open the app, select [facility], and log in with your MyChart username and password.  Due to Covid, a mask is required upon entering the hospital/clinic. If you do not have a mask, one will be given to you upon arrival. For doctor visits, patients may have 1 support person aged 42 or older with them. For treatment visits, patients cannot have anyone with them due to current Covid guidelines and our immunocompromised population.   Lanreotide injection What is this medication? LANREOTIDE (lan REE oh tide) is used to reduce blood levels of growth hormone in patients with a condition called acromegaly. It also works to slow or stop tumor growth in patients with neuroendocrine tumors and treat carcinoid syndrome. This medicine  may be used for other purposes; ask your health care provider or pharmacist if you have questions. COMMON BRAND NAME(S): Somatuline Depot What should I tell my care team before I take this medication? They need to know if you have any of these conditions: diabetes gallbladder disease heart disease kidney  disease liver disease thyroid disease an unusual or allergic reaction to lanreotide, other medicines, foods, dyes, or preservatives pregnant or trying to get pregnant breast-feeding How should I use this medication? This medicine is for injection under the skin. It is given by a health care professional in a hospital or clinic setting. Contact your pediatrician or health care professional regarding the use of this medicine in children. Special care may be needed. Overdosage: If you think you have taken too much of this medicine contact a poison control center or emergency room at once. NOTE: This medicine is only for you. Do not share this medicine with others. What if I miss a dose? It is important not to miss your dose. Call your doctor or health care professional if you are unable to keep an appointment. What may interact with this medication? This medicine may interact with the following medications: bromocriptine cyclosporine certain medicines for blood pressure, heart disease, irregular heart beat certain medicines for diabetes quinidine terfenadine This list may not describe all possible interactions. Give your health care provider a list of all the medicines, herbs, non-prescription drugs, or dietary supplements you use. Also tell them if you smoke, drink alcohol, or use illegal drugs. Some items may interact with your medicine. What should I watch for while using this medication? Tell your doctor or healthcare professional if your symptoms do not start to get better or if they get worse. Visit your doctor or health care professional for regular checks on your progress. Your condition will be monitored carefully while you are receiving this medicine. This medicine may increase blood sugar. Ask your healthcare provider if changes in diet or medicines are needed if you have diabetes. You may need blood work done while you are taking this medicine. Women should inform their doctor if  they wish to become pregnant or think they might be pregnant. There is a potential for serious side effects to an unborn child. Talk to your health care professional or pharmacist for more information. Do not breast-feed an infant while taking this medicine or for 6 months after stopping it. This medicine has caused ovarian failure in some women. This medicine may interfere with the ability to have a child. Talk with your doctor or health care professional if you are concerned about your fertility. What side effects may I notice from receiving this medication? Side effects that you should report to your doctor or health care professional as soon as possible: allergic reactions like skin rash, itching or hives, swelling of the face, lips, or tongue increased blood pressure severe stomach pain signs and symptoms of hgh blood sugar such as being more thirsty or hungry or having to urinate more than normal. You may also feel very tired or have blurry vision. signs and symptoms of low blood sugar such as feeling anxious; confusion; dizziness; increased hunger; unusually weak or tired; sweating; shakiness; cold; irritable; headache; blurred vision; fast heartbeat; loss of consciousness unusually slow heartbeat Side effects that usually do not require medical attention (report to your doctor or health care professional if they continue or are bothersome): constipation diarrhea dizziness headache muscle pain muscle spasms nausea pain, redness, or irritation at site where  injected This list may not describe all possible side effects. Call your doctor for medical advice about side effects. You may report side effects to FDA at 1-800-FDA-1088. Where should I keep my medication? This drug is given in a hospital or clinic and will not be stored at home. NOTE: This sheet is a summary. It may not cover all possible information. If you have questions about this medicine, talk to your doctor, pharmacist, or  health care provider.  2022 Elsevier/Gold Standard (2017-10-06 09:13:08)

## 2020-10-28 ENCOUNTER — Inpatient Hospital Stay: Payer: No Typology Code available for payment source

## 2020-10-28 ENCOUNTER — Inpatient Hospital Stay (HOSPITAL_BASED_OUTPATIENT_CLINIC_OR_DEPARTMENT_OTHER): Payer: No Typology Code available for payment source | Admitting: Oncology

## 2020-10-28 ENCOUNTER — Inpatient Hospital Stay: Payer: No Typology Code available for payment source | Attending: Nurse Practitioner

## 2020-10-28 ENCOUNTER — Other Ambulatory Visit: Payer: Self-pay

## 2020-10-28 VITALS — BP 110/80 | HR 61 | Temp 98.7°F | Resp 18 | Ht 62.0 in | Wt 171.2 lb

## 2020-10-28 DIAGNOSIS — C7A098 Malignant carcinoid tumors of other sites: Secondary | ICD-10-CM | POA: Insufficient documentation

## 2020-10-28 DIAGNOSIS — D0501 Lobular carcinoma in situ of right breast: Secondary | ICD-10-CM | POA: Diagnosis not present

## 2020-10-28 DIAGNOSIS — C7A8 Other malignant neuroendocrine tumors: Secondary | ICD-10-CM

## 2020-10-28 DIAGNOSIS — Z79899 Other long term (current) drug therapy: Secondary | ICD-10-CM | POA: Insufficient documentation

## 2020-10-28 DIAGNOSIS — C7B8 Other secondary neuroendocrine tumors: Secondary | ICD-10-CM

## 2020-10-28 LAB — CMP (CANCER CENTER ONLY)
ALT: 22 U/L (ref 0–44)
AST: 20 U/L (ref 15–41)
Albumin: 4.2 g/dL (ref 3.5–5.0)
Alkaline Phosphatase: 57 U/L (ref 38–126)
Anion gap: 10 (ref 5–15)
BUN: 20 mg/dL (ref 6–20)
CO2: 25 mmol/L (ref 22–32)
Calcium: 9.7 mg/dL (ref 8.9–10.3)
Chloride: 105 mmol/L (ref 98–111)
Creatinine: 0.74 mg/dL (ref 0.44–1.00)
GFR, Estimated: >60 mL/min (ref >60)
Glucose, Bld: 118 mg/dL — ABNORMAL HIGH (ref 70–99)
Potassium: 4.4 mmol/L (ref 3.5–5.1)
Sodium: 140 mmol/L (ref 135–145)
Total Bilirubin: 0.5 mg/dL (ref 0.3–1.2)
Total Protein: 7 g/dL (ref 6.5–8.1)

## 2020-10-28 LAB — CBC WITH DIFFERENTIAL (CANCER CENTER ONLY)
Abs Immature Granulocytes: 0.02 10*3/uL (ref 0.00–0.07)
Basophils Absolute: 0 10*3/uL (ref 0.0–0.1)
Basophils Relative: 1 %
Eosinophils Absolute: 0.1 10*3/uL (ref 0.0–0.5)
Eosinophils Relative: 2 %
HCT: 35.8 % — ABNORMAL LOW (ref 36.0–46.0)
Hemoglobin: 12.2 g/dL (ref 12.0–15.0)
Immature Granulocytes: 0 %
Lymphocytes Relative: 29 %
Lymphs Abs: 1.6 10*3/uL (ref 0.7–4.0)
MCH: 31.5 pg (ref 26.0–34.0)
MCHC: 34.1 g/dL (ref 30.0–36.0)
MCV: 92.5 fL (ref 80.0–100.0)
Monocytes Absolute: 0.5 10*3/uL (ref 0.1–1.0)
Monocytes Relative: 9 %
Neutro Abs: 3.3 10*3/uL (ref 1.7–7.7)
Neutrophils Relative %: 59 %
Platelet Count: 230 10*3/uL (ref 150–400)
RBC: 3.87 MIL/uL (ref 3.87–5.11)
RDW: 13 % (ref 11.5–15.5)
WBC Count: 5.6 10*3/uL (ref 4.0–10.5)
nRBC: 0 % (ref 0.0–0.2)

## 2020-10-28 MED ORDER — LANREOTIDE ACETATE 120 MG/0.5ML ~~LOC~~ SOLN
120.0000 mg | Freq: Once | SUBCUTANEOUS | Status: AC
  Administered 2020-10-28: 120 mg via SUBCUTANEOUS

## 2020-10-28 NOTE — Progress Notes (Signed)
[facility] OFFICE PROGRESS NOTE   Diagnosis: Carcinoid tumor  INTERVAL HISTORY:   Ms. Brandstetter returns as scheduled.  She continues monthly lanreotide.  No flushing or diarrhea.  She is working.  No complaint.  Objective:  Vital signs in last 24 hours:  Blood pressure 110/80, pulse 61, temperature 98.7 F (37.1 C), temperature source Oral, resp. rate 18, height 5\' 2"  (1.575 m), weight 171 lb 3.2 oz (77.7 kg), last menstrual period 05/17/2017, SpO2 99 %.   Lymphatics: No cervical, supraclavicular, or axillary nodes Resp: Lungs clear bilaterally Cardio: Regular rate and rhythm GI: No hepatosplenomegaly, no mass, nontender Vascular: No leg edema   Lab Results:  Lab Results  Component Value Date   WBC 5.6 10/28/2020   HGB 12.2 10/28/2020   HCT 35.8 (L) 10/28/2020   MCV 92.5 10/28/2020   PLT 230 10/28/2020   NEUTROABS 3.3 10/28/2020    CMP  Lab Results  Component Value Date   NA 140 10/28/2020   K 4.4 10/28/2020   CL 105 10/28/2020   CO2 25 10/28/2020   GLUCOSE 118 (H) 10/28/2020   BUN 20 10/28/2020   CREATININE 0.74 10/28/2020   CALCIUM 9.7 10/28/2020   PROT 7.0 10/28/2020   ALBUMIN 4.2 10/28/2020   AST 20 10/28/2020   ALT 22 10/28/2020   ALKPHOS 57 10/28/2020   BILITOT 0.5 10/28/2020   GFRNONAA >60 10/28/2020   GFRAA >60 10/08/2019     Medications: I have reviewed the patient's current medications.   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   06/07/2018 MRI of the liver-numerous hepatic lesions scattered throughout all aspects of the liver.  06/14/2018 PET scan-enlarged and hypermetabolic perihepatic and portacaval lymph nodes; diffuse heterogeneous uptake within the liver with subtle areas of mildly increased FDG uptake corresponding to suspicious lesions identified on MRI.   06/21/2018 biopsy of a right liver lobe mass-low-grade neuroendocrine tumor consistent  with metastasis, positive CD56, chromogranin, synaptophysin, CDX-2 and cytokeratin AE1/AE3. 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  Single focus of uptake in the proximal right humerus.  No clear primary tumor identified. Normal chromogranin A 07/05/2018 and normal 24-hour urine 5 HIAA 08/03/2018 Monthly lanreotide beginning 08/03/2018 CT 12/15/2018-small hepatic metastases, mildly improved, slight increase in size of a porta hepatis node CT abdomen/pelvis 07/02/2019-some of the liver lesions are slightly larger, others stable or smaller, slight increase in splenic lesions, minimal increase in abdominal adenopathy Cycle 1 Lutathera 08/14/2019 Cycle 2 Lutathera 10/08/2019 Cycle 3 Lutathera 12/03/2019 Cycle 4 Lutathera 01/29/2020 Netspot 03/11/2020-marked reduction in activity within hepatic metastases with only 1 remaining measurable lesion, decrease in size and radiotracer activity involving periportal and peripancreatic lymph nodes, decreased radiotracer activity at a subcarinal node and resolution of activity at a left supraclavicular node, resolution of focal metabolic activity in the right humerus, near complete resolution of activity in the left T3 transverse process, decreased activity involving a right acetabular lesion, no evidence of new or progressive disease Monthly lanreotide continued 3.   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 Lindi Adie 4.   Hypercholesterolemia 5.   Nausea and vomiting following cycle 1 Lutathera     Disposition: Ms. Gerard appears stable.  She is asymptomatic from the carcinoid tumor.  We will continue monthly lanreotide.  We will follow-up on the chromogranin level from today.  She will be scheduled for a screening breast MRI next month.  She will return for  an office visit in 3 months.  We will plan for a restaging Netspot at a 1 year interval.  Betsy Coder, MD  10/28/2020  10:56 AM

## 2020-10-28 NOTE — Patient Instructions (Signed)
[facility]  Discharge Instructions: Thank you for choosing [facility] to provide your oncology and hematology care.   If you have a lab appointment with the [facility], please go directly to the [facility] and check in at the registration area.   Wear comfortable clothing and clothing appropriate for easy access to any Portacath or PICC line.   We strive to give you quality time with your provider. You may need to reschedule your appointment if you arrive late (15 or more minutes).  Arriving late affects you and other patients whose appointments are after yours.  Also, if you miss three or more appointments without notifying the office, you may be dismissed from the clinic at the provider's discretion.      For prescription refill requests, have your pharmacy contact our office and allow 72 hours for refills to be completed.    Today you received the following medication: Lanreotide   To help prevent nausea and vomiting after your treatment, we encourage you to take your nausea medication as directed.  BELOW ARE SYMPTOMS THAT SHOULD BE REPORTED IMMEDIATELY: *FEVER GREATER THAN 100.4 F (38 C) OR HIGHER *CHILLS OR SWEATING *NAUSEA AND VOMITING THAT IS NOT CONTROLLED WITH YOUR NAUSEA MEDICATION *UNUSUAL SHORTNESS OF BREATH *UNUSUAL BRUISING OR BLEEDING *URINARY PROBLEMS (pain or burning when urinating, or frequent urination) *BOWEL PROBLEMS (unusual diarrhea, constipation, pain near the anus) TENDERNESS IN MOUTH AND THROAT WITH OR WITHOUT PRESENCE OF ULCERS (sore throat, sores in mouth, or a toothache) UNUSUAL RASH, SWELLING OR PAIN  UNUSUAL VAGINAL DISCHARGE OR ITCHING   Items with * indicate a potential emergency and should be followed up as soon as possible or go to the Emergency Department if any problems should occur.  Please show the CHEMOTHERAPY ALERT CARD or IMMUNOTHERAPY ALERT CARD at check-in to the Emergency Department and triage  nurse.  Should you have questions after your visit or need to cancel or reschedule your appointment, please contact [facility]  Dept: [phone]  and follow the prompts.  Office hours are 8:00 a.m. to 4:30 p.m. Monday - Friday. Please note that voicemails left after 4:00 p.m. may not be returned until the following business day.  We are closed weekends and major holidays. You have access to a nurse at all times for urgent questions. Please call the main number to the clinic Dept: [phone] and follow the prompts.   For any non-urgent questions, you may also contact your provider using MyChart. We now offer e-Visits for anyone 77 and older to request care online for non-urgent symptoms. For details visit [email or web address].   Also download the MyChart app! Go to the app store, search "MyChart", open the app, select [facility], and log in with your MyChart username and password.  Due to Covid, a mask is required upon entering the hospital/clinic. If you do not have a mask, one will be given to you upon arrival. For doctor visits, patients may have 1 support person aged 41 or older with them. For treatment visits, patients cannot have anyone with them due to current Covid guidelines and our immunocompromised population.   Lanreotide injection What is this medication? LANREOTIDE (lan REE oh tide) is used to reduce blood levels of growth hormone in patients with a condition called acromegaly. It also works to slow or stop tumor growth in patients with neuroendocrine tumors and treat carcinoidsyndrome. This medicine may be used for other purposes; ask your health  care provider orpharmacist if you have questions. COMMON BRAND NAME(S): Somatuline Depot What should I tell my care team before I take this medication? They need to know if you have any of these conditions: diabetes gallbladder disease heart disease kidney disease liver disease thyroid disease an  unusual or allergic reaction to lanreotide, other medicines, foods, dyes, or preservatives pregnant or trying to get pregnant breast-feeding How should I use this medication? This medicine is for injection under the skin. It is given by a health careprofessional in a hospital or clinic setting. Contact your pediatrician or health care professional regarding the use of thismedicine in children. Special care may be needed. Overdosage: If you think you have taken too much of this medicine contact apoison control center or emergency room at once. NOTE: This medicine is only for you. Do not share this medicine with others. What if I miss a dose? It is important not to miss your dose. Call your doctor or health careprofessional if you are unable to keep an appointment. What may interact with this medication? This medicine may interact with the following medications: bromocriptine cyclosporine certain medicines for blood pressure, heart disease, irregular heart beat certain medicines for diabetes quinidine terfenadine This list may not describe all possible interactions. Give your health care provider a list of all the medicines, herbs, non-prescription drugs, or dietary supplements you use. Also tell them if you smoke, drink alcohol, or use illegaldrugs. Some items may interact with your medicine. What should I watch for while using this medication? Tell your doctor or healthcare professional if your symptoms do not start toget better or if they get worse. Visit your doctor or health care professional for regular checks on your progress. Your condition will be monitored carefully while you are receivingthis medicine. This medicine may increase blood sugar. Ask your healthcare provider if changesin diet or medicines are needed if you have diabetes. You may need blood work done while you are taking this medicine. Women should inform their doctor if they wish to become pregnant or think they might be  pregnant. There is a potential for serious side effects to an unborn child. Talk to your health care professional or pharmacist for more information. Do not breast-feed an infant while taking this medicine or for 46months after stopping it. This medicine has caused ovarian failure in some women. This medicine may interfere with the ability to have a child. Talk with your doctor or healthcare professional if you are concerned about your fertility. What side effects may I notice from receiving this medication? Side effects that you should report to your doctor or health care professionalas soon as possible: allergic reactions like skin rash, itching or hives, swelling of the face, lips, or tongue increased blood pressure severe stomach pain signs and symptoms of hgh blood sugar such as being more thirsty or hungry or having to urinate more than normal. You may also feel very tired or have blurry vision. signs and symptoms of low blood sugar such as feeling anxious; confusion; dizziness; increased hunger; unusually weak or tired; sweating; shakiness; cold; irritable; headache; blurred vision; fast heartbeat; loss of consciousness unusually slow heartbeat Side effects that usually do not require medical attention (report to yourdoctor or health care professional if they continue or are bothersome): constipation diarrhea dizziness headache muscle pain muscle spasms nausea pain, redness, or irritation at site where injected This list may not describe all possible side effects. Call your doctor for medical advice about side effects. You may report  side effects to FDA at1-800-FDA-1088. Where should I keep my medication? This drug is given in a hospital or clinic and will not be stored at home. NOTE: This sheet is a summary. It may not cover all possible information. If you have questions about this medicine, talk to your doctor, pharmacist, orhealth care provider.  2022 Elsevier/Gold Standard  (2017-10-06 09:13:08)

## 2020-10-29 LAB — CHROMOGRANIN A: Chromogranin A (ng/mL): 34.1 ng/mL (ref 0.0–101.8)

## 2020-11-11 ENCOUNTER — Other Ambulatory Visit: Payer: Self-pay

## 2020-11-11 ENCOUNTER — Ambulatory Visit
Admission: RE | Admit: 2020-11-11 | Discharge: 2020-11-11 | Disposition: A | Discharge status: Home / Self Care | Payer: No Typology Code available for payment source | Source: Ambulatory Visit | Attending: Oncology | Admitting: Oncology

## 2020-11-11 DIAGNOSIS — D0501 Lobular carcinoma in situ of right breast: Secondary | ICD-10-CM

## 2020-11-11 MED ORDER — GADOBUTROL 1 MMOL/ML IV SOLN
8.0000 mL | Freq: Once | INTRAVENOUS | Status: AC | PRN
  Administered 2020-11-11: 8 mL via INTRAVENOUS

## 2020-11-25 ENCOUNTER — Other Ambulatory Visit: Payer: Self-pay

## 2020-11-25 ENCOUNTER — Inpatient Hospital Stay: Payer: No Typology Code available for payment source | Attending: Nurse Practitioner

## 2020-11-25 VITALS — BP 110/73 | HR 67 | Temp 97.8°F | Resp 18

## 2020-11-25 DIAGNOSIS — Z79899 Other long term (current) drug therapy: Secondary | ICD-10-CM | POA: Insufficient documentation

## 2020-11-25 DIAGNOSIS — C7A098 Malignant carcinoid tumors of other sites: Secondary | ICD-10-CM | POA: Diagnosis not present

## 2020-11-25 DIAGNOSIS — C7A8 Other malignant neuroendocrine tumors: Secondary | ICD-10-CM

## 2020-11-25 DIAGNOSIS — C7B8 Other secondary neuroendocrine tumors: Secondary | ICD-10-CM

## 2020-11-25 MED ORDER — LANREOTIDE ACETATE 120 MG/0.5ML ~~LOC~~ SOLN
120.0000 mg | Freq: Once | SUBCUTANEOUS | Status: AC
  Administered 2020-11-25: 120 mg via SUBCUTANEOUS

## 2020-11-25 NOTE — Patient Instructions (Signed)
[facility]  Discharge Instructions: Thank you for choosing [facility] to provide your oncology and hematology care.   If you have a lab appointment with the [facility], please go directly to the [facility] and check in at the registration area.   Wear comfortable clothing and clothing appropriate for easy access to any Portacath or PICC line.   We strive to give you quality time with your provider. You may need to reschedule your appointment if you arrive late (15 or more minutes).  Arriving late affects you and other patients whose appointments are after yours.  Also, if you miss three or more appointments without notifying the office, you may be dismissed from the clinic at the provider's discretion.      For prescription refill requests, have your pharmacy contact our office and allow 72 hours for refills to be completed.    Today you received the following chemotherapy and/or immunotherapy agents Lanreotide   To help prevent nausea and vomiting after your treatment, we encourage you to take your nausea medication as directed.  BELOW ARE SYMPTOMS THAT SHOULD BE REPORTED IMMEDIATELY: *FEVER GREATER THAN 100.4 F (38 C) OR HIGHER *CHILLS OR SWEATING *NAUSEA AND VOMITING THAT IS NOT CONTROLLED WITH YOUR NAUSEA MEDICATION *UNUSUAL SHORTNESS OF BREATH *UNUSUAL BRUISING OR BLEEDING *URINARY PROBLEMS (pain or burning when urinating, or frequent urination) *BOWEL PROBLEMS (unusual diarrhea, constipation, pain near the anus) TENDERNESS IN MOUTH AND THROAT WITH OR WITHOUT PRESENCE OF ULCERS (sore throat, sores in mouth, or a toothache) UNUSUAL RASH, SWELLING OR PAIN  UNUSUAL VAGINAL DISCHARGE OR ITCHING   Items with * indicate a potential emergency and should be followed up as soon as possible or go to the Emergency Department if any problems should occur.  Please show the CHEMOTHERAPY ALERT CARD or IMMUNOTHERAPY ALERT CARD at check-in to the  Emergency Department and triage nurse.  Should you have questions after your visit or need to cancel or reschedule your appointment, please contact [facility]  Dept: [phone]  and follow the prompts.  Office hours are 8:00 a.m. to 4:30 p.m. Monday - Friday. Please note that voicemails left after 4:00 p.m. may not be returned until the following business day.  We are closed weekends and major holidays. You have access to a nurse at all times for urgent questions. Please call the main number to the clinic Dept: [phone] and follow the prompts.   For any non-urgent questions, you may also contact your provider using MyChart. We now offer e-Visits for anyone 50 and older to request care online for non-urgent symptoms. For details visit [email or web address].   Also download the MyChart app! Go to the app store, search "MyChart", open the app, select [facility], and log in with your MyChart username and password.  Due to Covid, a mask is required upon entering the hospital/clinic. If you do not have a mask, one will be given to you upon arrival. For doctor visits, patients may have 1 support person aged 60 or older with them. For treatment visits, patients cannot have anyone with them due to current Covid guidelines and our immunocompromised population.   Lanreotide injection What is this medication? LANREOTIDE (lan REE oh tide) is used to reduce blood levels of growth hormone in patients with a condition called acromegaly. It also works to slow or stop tumor growth in patients with neuroendocrine tumors and treat carcinoid syndrome. This medicine may be used for other  purposes; ask your health care provider or pharmacist if you have questions. COMMON BRAND NAME(S): Somatuline Depot What should I tell my care team before I take this medication? They need to know if you have any of these conditions: diabetes gallbladder disease heart disease kidney disease liver  disease thyroid disease an unusual or allergic reaction to lanreotide, other medicines, foods, dyes, or preservatives pregnant or trying to get pregnant breast-feeding How should I use this medication? This medicine is for injection under the skin. It is given by a health care professional in a hospital or clinic setting. Contact your pediatrician or health care professional regarding the use of this medicine in children. Special care may be needed. Overdosage: If you think you have taken too much of this medicine contact a poison control center or emergency room at once. NOTE: This medicine is only for you. Do not share this medicine with others. What if I miss a dose? It is important not to miss your dose. Call your doctor or health care professional if you are unable to keep an appointment. What may interact with this medication? This medicine may interact with the following medications: bromocriptine cyclosporine certain medicines for blood pressure, heart disease, irregular heart beat certain medicines for diabetes quinidine terfenadine This list may not describe all possible interactions. Give your health care provider a list of all the medicines, herbs, non-prescription drugs, or dietary supplements you use. Also tell them if you smoke, drink alcohol, or use illegal drugs. Some items may interact with your medicine. What should I watch for while using this medication? Tell your doctor or healthcare professional if your symptoms do not start to get better or if they get worse. Visit your doctor or health care professional for regular checks on your progress. Your condition will be monitored carefully while you are receiving this medicine. This medicine may increase blood sugar. Ask your healthcare provider if changes in diet or medicines are needed if you have diabetes. You may need blood work done while you are taking this medicine. Women should inform their doctor if they wish to  become pregnant or think they might be pregnant. There is a potential for serious side effects to an unborn child. Talk to your health care professional or pharmacist for more information. Do not breast-feed an infant while taking this medicine or for 6 months after stopping it. This medicine has caused ovarian failure in some women. This medicine may interfere with the ability to have a child. Talk with your doctor or health care professional if you are concerned about your fertility. What side effects may I notice from receiving this medication? Side effects that you should report to your doctor or health care professional as soon as possible: allergic reactions like skin rash, itching or hives, swelling of the face, lips, or tongue increased blood pressure severe stomach pain signs and symptoms of hgh blood sugar such as being more thirsty or hungry or having to urinate more than normal. You may also feel very tired or have blurry vision. signs and symptoms of low blood sugar such as feeling anxious; confusion; dizziness; increased hunger; unusually weak or tired; sweating; shakiness; cold; irritable; headache; blurred vision; fast heartbeat; loss of consciousness unusually slow heartbeat Side effects that usually do not require medical attention (report to your doctor or health care professional if they continue or are bothersome): constipation diarrhea dizziness headache muscle pain muscle spasms nausea pain, redness, or irritation at site where injected This list may not  describe all possible side effects. Call your doctor for medical advice about side effects. You may report side effects to FDA at 1-800-FDA-1088. Where should I keep my medication? This drug is given in a hospital or clinic and will not be stored at home. NOTE: This sheet is a summary. It may not cover all possible information. If you have questions about this medicine, talk to your doctor, pharmacist, or health care  provider.  2022 Elsevier/Gold Standard (2017-11-16 00:00:00)

## 2020-12-23 ENCOUNTER — Ambulatory Visit: Payer: No Typology Code available for payment source

## 2020-12-24 ENCOUNTER — Inpatient Hospital Stay: Payer: No Typology Code available for payment source | Attending: Nurse Practitioner

## 2020-12-24 ENCOUNTER — Other Ambulatory Visit: Payer: Self-pay

## 2020-12-24 ENCOUNTER — Telehealth: Payer: Self-pay | Admitting: Gastroenterology

## 2020-12-24 VITALS — BP 110/73 | HR 60 | Temp 97.9°F | Resp 17

## 2020-12-24 DIAGNOSIS — C7A8 Other malignant neuroendocrine tumors: Secondary | ICD-10-CM

## 2020-12-24 DIAGNOSIS — C7B8 Other secondary neuroendocrine tumors: Secondary | ICD-10-CM

## 2020-12-24 DIAGNOSIS — C7A098 Malignant carcinoid tumors of other sites: Secondary | ICD-10-CM | POA: Diagnosis not present

## 2020-12-24 DIAGNOSIS — C7B02 Secondary carcinoid tumors of liver: Secondary | ICD-10-CM | POA: Diagnosis present

## 2020-12-24 DIAGNOSIS — C7B03 Secondary carcinoid tumors of bone: Secondary | ICD-10-CM | POA: Insufficient documentation

## 2020-12-24 MED ORDER — LANREOTIDE ACETATE 120 MG/0.5ML ~~LOC~~ SOLN
120.0000 mg | Freq: Once | SUBCUTANEOUS | Status: AC
  Administered 2020-12-24: 10:00:00 120 mg via SUBCUTANEOUS

## 2020-12-24 NOTE — Telephone Encounter (Signed)
Case discussed with Dr. Watt Climes briefly. Case reviewed. Patient with history of metastatic neuroendocrine tumor. Recent consultation outside of the area that suggested the consideration of evaluation to rule out a primary source for the neuroendocrine tumor.  Consideration of enteroscopy and endoscopic ultrasound to evaluate the pancreas and the small bowel to see if a lesion can be noted that may have been the primary. There could be implications for further therapy if a primary site can be found. Review of the imaging up to this point has shown periportal lymphadenopathy and previously a peripancreatic lymph node but more recent imaging has not.  We will plan to proceed with scheduling an enteroscopy/EUS.  Justice Britain, MD [facility] Office # [phone]

## 2020-12-24 NOTE — Telephone Encounter (Signed)
EUS enteroscopy has been scheduled for 12/29 at 845 am at [facility] with [facility].

## 2020-12-24 NOTE — Patient Instructions (Signed)
Lanreotide injection °What is this medication? °LANREOTIDE (lan REE oh tide) is used to reduce blood levels of growth hormone in patients with a condition called acromegaly. It also works to slow or stop tumor growth in patients with neuroendocrine tumors and treat carcinoid syndrome. °This medicine may be used for other purposes; ask your health care provider or pharmacist if you have questions. °COMMON BRAND NAME(S): Somatuline Depot °What should I tell my care team before I take this medication? °They need to know if you have any of these conditions: °diabetes °gallbladder disease °heart disease °kidney disease °liver disease °thyroid disease °an unusual or allergic reaction to lanreotide, other medicines, foods, dyes, or preservatives °pregnant or trying to get pregnant °breast-feeding °How should I use this medication? °This medicine is for injection under the skin. It is given by a health care professional in a hospital or clinic setting. °Contact your pediatrician or health care professional regarding the use of this medicine in children. Special care may be needed. °Overdosage: If you think you have taken too much of this medicine contact a poison control center or emergency room at once. °NOTE: This medicine is only for you. Do not share this medicine with others. °What if I miss a dose? °It is important not to miss your dose. Call your doctor or health care professional if you are unable to keep an appointment. °What may interact with this medication? °This medicine may interact with the following medications: °bromocriptine °cyclosporine °certain medicines for blood pressure, heart disease, irregular heart beat °certain medicines for diabetes °quinidine °terfenadine °This list may not describe all possible interactions. Give your health care provider a list of all the medicines, herbs, non-prescription drugs, or dietary supplements you use. Also tell them if you smoke, drink alcohol, or use illegal drugs.  Some items may interact with your medicine. °What should I watch for while using this medication? °Tell your doctor or healthcare professional if your symptoms do not start to get better or if they get worse. °Visit your doctor or health care professional for regular checks on your progress. Your condition will be monitored carefully while you are receiving this medicine. °This medicine may increase blood sugar. Ask your healthcare provider if changes in diet or medicines are needed if you have diabetes. °You may need blood work done while you are taking this medicine. °Women should inform their doctor if they wish to become pregnant or think they might be pregnant. There is a potential for serious side effects to an unborn child. Talk to your health care professional or pharmacist for more information. Do not breast-feed an infant while taking this medicine or for 6 months after stopping it. °This medicine has caused ovarian failure in some women. This medicine may interfere with the ability to have a child. Talk with your doctor or health care professional if you are concerned about your fertility. °What side effects may I notice from receiving this medication? °Side effects that you should report to your doctor or health care professional as soon as possible: °allergic reactions like skin rash, itching or hives, swelling of the face, lips, or tongue °increased blood pressure °severe stomach pain °signs and symptoms of hgh blood sugar such as being more thirsty or hungry or having to urinate more than normal. You may also feel very tired or have blurry vision. °signs and symptoms of low blood sugar such as feeling anxious; confusion; dizziness; increased hunger; unusually weak or tired; sweating; shakiness; cold; irritable; headache; blurred vision; fast   heartbeat; loss of consciousness °unusually slow heartbeat °Side effects that usually do not require medical attention (report to your doctor or health care  professional if they continue or are bothersome): °constipation °diarrhea °dizziness °headache °muscle pain °muscle spasms °nausea °pain, redness, or irritation at site where injected °This list may not describe all possible side effects. Call your doctor for medical advice about side effects. You may report side effects to FDA at 1-800-FDA-1088. °Where should I keep my medication? °This drug is given in a hospital or clinic and will not be stored at home. °NOTE: This sheet is a summary. It may not cover all possible information. If you have questions about this medicine, talk to your doctor, pharmacist, or health care provider. °© 2022 Elsevier/Gold Standard (2017-11-16 00:00:00) ° °

## 2020-12-24 NOTE — Telephone Encounter (Signed)
I spoke with the pt and she has been advised of the appt for EUS/enteroscopy on 12/29 at 845 am.  She states she has spoken with the office she saw recently and they will have her records faxed to our office for Dr  Rush Landmark to review.  All information for EUS has been sent to My Chart and mailed to the pt home address.

## 2020-12-26 ENCOUNTER — Encounter (HOSPITAL_COMMUNITY): Payer: Self-pay | Admitting: Gastroenterology

## 2020-12-29 ENCOUNTER — Other Ambulatory Visit: Payer: Self-pay

## 2021-01-08 ENCOUNTER — Ambulatory Visit (HOSPITAL_COMMUNITY): Payer: No Typology Code available for payment source | Admitting: Anesthesiology

## 2021-01-08 ENCOUNTER — Ambulatory Visit (HOSPITAL_COMMUNITY)
Admission: RE | Admit: 2021-01-08 | Discharge: 2021-01-08 | Disposition: A | Discharge status: Home / Self Care | Payer: No Typology Code available for payment source | Attending: Gastroenterology | Admitting: Gastroenterology

## 2021-01-08 ENCOUNTER — Encounter (HOSPITAL_COMMUNITY)
Admission: RE | Disposition: A | Discharge status: Home / Self Care | Payer: Self-pay | Source: Home / Self Care | Attending: Gastroenterology

## 2021-01-08 ENCOUNTER — Other Ambulatory Visit: Payer: Self-pay

## 2021-01-08 ENCOUNTER — Encounter (HOSPITAL_COMMUNITY): Payer: Self-pay | Admitting: Gastroenterology

## 2021-01-08 DIAGNOSIS — C801 Malignant (primary) neoplasm, unspecified: Secondary | ICD-10-CM | POA: Insufficient documentation

## 2021-01-08 DIAGNOSIS — C787 Secondary malignant neoplasm of liver and intrahepatic bile duct: Secondary | ICD-10-CM | POA: Insufficient documentation

## 2021-01-08 DIAGNOSIS — K449 Diaphragmatic hernia without obstruction or gangrene: Secondary | ICD-10-CM

## 2021-01-08 DIAGNOSIS — K209 Esophagitis, unspecified without bleeding: Secondary | ICD-10-CM | POA: Diagnosis not present

## 2021-01-08 DIAGNOSIS — K2289 Other specified disease of esophagus: Secondary | ICD-10-CM | POA: Diagnosis not present

## 2021-01-08 DIAGNOSIS — Z08 Encounter for follow-up examination after completed treatment for malignant neoplasm: Secondary | ICD-10-CM | POA: Insufficient documentation

## 2021-01-08 DIAGNOSIS — C7B8 Other secondary neuroendocrine tumors: Secondary | ICD-10-CM

## 2021-01-08 HISTORY — PX: BIOPSY: SHX5522

## 2021-01-08 HISTORY — PX: ENTEROSCOPY: SHX5533

## 2021-01-08 HISTORY — PX: SUBMUCOSAL TATTOO INJECTION: SHX6856

## 2021-01-08 HISTORY — PX: EUS: SHX5427

## 2021-01-08 SURGERY — ENTEROSCOPY
Anesthesia: Monitor Anesthesia Care

## 2021-01-08 MED ORDER — PROPOFOL 10 MG/ML IV BOLUS
INTRAVENOUS | Status: DC | PRN
  Administered 2021-01-08 (×9): 20 mg via INTRAVENOUS

## 2021-01-08 MED ORDER — PROPOFOL 500 MG/50ML IV EMUL
INTRAVENOUS | Status: DC | PRN
  Administered 2021-01-08: 150 ug/kg/min via INTRAVENOUS

## 2021-01-08 MED ORDER — LACTATED RINGERS IV SOLN: INTRAVENOUS | Status: DC

## 2021-01-08 MED ORDER — SPOT INK MARKER SYRINGE KIT
PACK | SUBMUCOSAL | Status: AC
  Filled 2021-01-08: qty 5

## 2021-01-08 MED ORDER — SODIUM CHLORIDE 0.9 % IV SOLN: INTRAVENOUS | Status: DC

## 2021-01-08 NOTE — H&P (Signed)
GASTROENTEROLOGY PROCEDURE H&P NOTE   Primary Care Physician: Mayra Neer, MD  HPI: Sally Brown is a 55 y.o. 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   Family history of breast cancer    Hyperlipidemia    PONV (postoperative nausea and vomiting)    SVD (spontaneous vaginal delivery)    x 3   Past Surgical History:  Procedure Laterality Date   ANAL FISSURE REPAIR N/A 09/02/2020   Procedure: EXCISION ANAL POLYP;  Surgeon: Rolm Bookbinder, MD;  Location: [facility];  Service: General;  Laterality: N/A;   BILATERAL SALPINGECTOMY Bilateral 06/01/2017   Procedure: BILATERAL SALPINGECTOMY;  Surgeon: Christophe Louis, MD;  Location: [facility];  Service: Gynecology;  Laterality: Bilateral;   BREAST BIOPSY Left 2009   BREAST BIOPSY Right 2020   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   CHOLECYSTECTOMY  2002   COLONOSCOPY     polyp   CYSTOSCOPY N/A 06/01/2017   Procedure: CYSTOSCOPY;  Surgeon: Bjorn Loser, MD;  Location: [facility];  Service: Urology;  Laterality: N/A;   DILATION AND CURETTAGE OF UTERUS  2005   left arm surgery     nerve relaese   LIPOMA EXCISION Right 09/02/2020   Procedure: EXCISION LIPOMA RIGHT BUTTOCK;  Surgeon: Rolm Bookbinder, MD;  Location: [facility];  Service: General;  Laterality: Right;   NovaSure Ablation  2006   PUBOVAGINAL SLING N/A 06/01/2017   Procedure: Gaynelle Arabian;  Surgeon: Bjorn Loser, MD;  Location: [facility];  Service: Urology;  Laterality: N/A;   TUBAL LIGATION     VAGINAL HYSTERECTOMY N/A 06/01/2017   Procedure: HYSTERECTOMY VAGINAL;  Surgeon: Christophe Louis, MD;  Location: [facility];  Service: Gynecology;  Laterality: N/A;   No current facility-administered medications for this encounter.   No current  facility-administered medications for this encounter. Allergies  Allergen Reactions   Codeine Hives   Sulfa Antibiotics Hives   Sulfamethoxazole-Trimethoprim Rash   Family History  Problem Relation Age of Onset   Breast cancer Mother 64       again at age 10   Heart attack Mother    Lung cancer Mother    Hyperlipidemia Mother    Dementia Father    Other Maternal Grandmother        died in childbirth   Colon cancer Paternal Grandmother    Breast cancer Other        3 of MGMs sisters   Social History   Socioeconomic History   Marital status: Single    Spouse name: Not on file   Number of children: 3   Years of education: Not on file   Highest education level: Not on file  Occupational History   Not on file  Tobacco Use   Smoking status: Never   Smokeless tobacco: Never  Vaping Use   Vaping Use: Never used  Substance and Sexual Activity   Alcohol use: Yes    Alcohol/week: 2.0 - 3.0 standard drinks    Types: 2 - 3 Glasses of wine per week   Drug use: Never   Sexual activity: Yes    Birth control/protection: Surgical  Other Topics Concern   Not on file  Social History Narrative   Not on file   Social Determinants of Health   Financial  Resource Strain: Not on file  Food Insecurity: Not on file  Transportation Needs: Not on file  Physical Activity: Not on file  Stress: Not on file  Social Connections: Not on file  Intimate Partner Violence: Not on file    Physical Exam: Today's Vitals   01/08/21 0723 01/08/21 0725  BP: (!) 135/59   Pulse: 60   Temp: (!) 97 F (36.1 C)   TempSrc: Oral   SpO2: 98%   Weight:  77.1 kg  Height:  5\' 2"  (1.575 m)  PainSc: 0-No pain 0-No pain   Body mass index is 31.09 kg/m. GEN: NAD EYE: Sclerae anicteric ENT: MMM CV: Non-tachycardic GI: Soft, NT/ND NEURO:  Alert & Oriented x 3  Lab Results: No results for input(s): WBC, HGB, HCT, PLT in the last 72 hours. BMET No results for input(s): NA, K, CL, CO2, GLUCOSE,  BUN, CREATININE, CALCIUM in the last 72 hours. LFT No results for input(s): PROT, ALBUMIN, AST, ALT, ALKPHOS, BILITOT, BILIDIR, IBILI in the last 72 hours. PT/INR No results for input(s): LABPROT, INR in the last 72 hours.   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  The risks of an EUS including intestinal perforation, bleeding, infection, aspiration, and medication effects were discussed as was the possibility it may not give a definitive diagnosis if a biopsy is performed.  When a biopsy of the pancreas is done as part of the EUS, there is an additional risk of pancreatitis at the rate of about 1-2%.  It was explained that procedure related pancreatitis is typically mild, although it can be severe and even life threatening, which is why we do not perform random pancreatic biopsies and only biopsy a lesion/area we feel is concerning enough to warrant the risk.   The risks and benefits of endoscopic evaluation/treatment were discussed with the patient and/or family; these include but are not limited to the risk of perforation, infection, bleeding, missed lesions, lack of diagnosis, severe illness requiring hospitalization, as well as anesthesia and sedation related illnesses.  The patient's history has been reviewed, patient examined, no change in status, and deemed stable for procedure.  The patient and/or family is agreeable to proceed.    Justice Britain, MD [facility] Office # [phone]

## 2021-01-08 NOTE — Transfer of Care (Signed)
Immediate Anesthesia Transfer of Care Note  Patient: Sally Brown  Procedure(s) Performed: ENTEROSCOPY UPPER ENDOSCOPIC ULTRASOUND (EUS) RADIAL SUBMUCOSAL TATTOO INJECTION BIOPSY  Patient Location: PACU  Anesthesia Type:MAC  Level of Consciousness: drowsy  Airway & Oxygen Therapy: Patient Spontanous Breathing and Patient connected to nasal cannula oxygen  Post-op Assessment: Report given to RN and Post -op Vital signs reviewed and stable  Post vital signs: Reviewed and stable  Last Vitals:  Vitals Value Taken Time  BP    Temp    Pulse    Resp    SpO2      Last Pain:  Vitals:   01/08/21 0725  TempSrc:   PainSc: 0-No pain         Complications: No notable events documented.

## 2021-01-08 NOTE — Anesthesia Postprocedure Evaluation (Addendum)
Anesthesia Post Note  Patient: SERA HITSMAN  Procedure(s) Performed: ENTEROSCOPY UPPER ENDOSCOPIC ULTRASOUND (EUS) RADIAL SUBMUCOSAL TATTOO INJECTION BIOPSY     Patient location during evaluation: PACU Anesthesia Type: MAC Level of consciousness: awake and alert, patient cooperative and oriented Pain management: pain level controlled Vital Signs Assessment: post-procedure vital signs reviewed and stable Respiratory status: spontaneous breathing, nonlabored ventilation and respiratory function stable Cardiovascular status: stable and blood pressure returned to baseline Postop Assessment: no apparent nausea or vomiting Anesthetic complications: no   No notable events documented.  Last Vitals:  Vitals:   01/08/21 0935 01/08/21 0950  BP: 122/66 122/73  Pulse: (!) 59 (!) 54  Resp: 13 20  Temp:  36.5 C  SpO2: 100% 100%    Last Pain:  Vitals:   01/08/21 0950  TempSrc:   PainSc: 0-No pain                 Kateline Kinkade,E. Toni Demo

## 2021-01-08 NOTE — Op Note (Addendum)
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 Estimated Blood Loss:     Estimated blood loss was minimal. Procedure:                Pre-Anesthesia Assessment:                           - Prior to the procedure, a History and Physical                            was performed, and patient medications and                            allergies were reviewed. The patient's tolerance of                            previous anesthesia was also reviewed. The risks                            and benefits of the procedure and the sedation                            options and risks were discussed with the patient.                            All questions were answered, and informed consent                            was obtained. Prior Anticoagulants: The patient has  taken no previous anticoagulant or antiplatelet                            agents. ASA Grade Assessment: II - A patient with                            mild systemic disease. After reviewing the risks                            and benefits, the patient was deemed in                            satisfactory condition to undergo the procedure.                            After obtaining informed consent, the endoscope was                            passed under direct vision. Throughout the                            procedure, the patient's blood pressure, pulse, and                            oxygen saturations were monitored continuously. The                            [id number]-HQ190L ([id number]) Olympus colonoscope was                            introduced through the mouth, and advanced to the                            jejunum. The [id number]-[id number] ([id number]) Olympus                            duodenoscope was introduced through the mouth, and                            advanced to the area of papilla. The [id number]                            ([id number]) Olympus linear ultrasound scope was                            introduced through the mouth, and advanced to the                            duodenum for ultrasound examination from the                            stomach and duodenum. The upper EUS was  accomplished without difficulty. The patient                            tolerated the procedure. Scope In: Scope Out: Findings:      ENDOSCOPIC FINDING: :      No gross lesions were noted in the proximal esophagus and in the mid       esophagus. When the procedure was completed, I notice a slight mucosal       wrent in the upper EUS region but no evidence of perforation.      One tongue of salmon-colored mucosa was present from 34 to 35 cm.       Biopsies were taken with a cold forceps for histology.      The Z-line was irregular and was found 35 cm from the incisors.      A 3 cm hiatal hernia was present.      Normal mucosa was found in the entire examined stomach.      No gross lesions were noted in the entire examined duodenum.      The major papilla was normal.      Normal mucosa was found in the proximal jejunum. Area was tattooed with       an injection of Spot (carbon black) to demarcate the distal extent of       today's  procedure.      ENDOSONOGRAPHIC FINDING: :      There was no sign of significant endosonographic abnormality in the       pancreatic head (PD - 1.4 mm), genu of the pancreas (PD - 1.2 mm),       pancreatic body (PD - 1.0 mm-> 0.7 mm) and pancreatic tail (PD - 1.1       mm). No masses, no cysts, no calcifications, the pancreatic duct was       regular in contour.      There was no sign of significant endosonographic abnormality in the       common bile duct (3.8 mm) and in the common hepatic duct (3.7 mm). No       stones, no biliary sludge and ducts with regular contour were identified.      Endosonographic imaging of the ampulla showed no extrinsic compression,       intramural (subepithelial) lesion, mass, varices or wall thickening.      Endosonographic imaging in the visualized portion of the liver showed no       mass.      No malignant-appearing lymph nodes were visualized in the celiac region       (level 20), peripancreatic region and porta hepatis region.      The celiac region was visualized. Impression:               SBE Impression:                           - No gross lesions in esophagus proximally - but                            after procedure was complete, did notice a slight                            mucosal wrent in the proximal esophagus near UES).  Salmon-colored mucosa tongue suspicious for                            short-segment Barrett's esophagus - biopsied.                            Z-line irregular, 35 cm from the incisors.                           - 3 cm hiatal hernia.                           - Normal mucosa was found in the entire stomach.                           - No gross lesions in the entire examined duodenum.                           - Normal major papilla.                           - Normal mucosa was found in the visualized                            proximal jejunum. Tattooed distal extent.                            EUS Impression:                           - There was no sign of significant pathology in the                            pancreatic head, genu of the pancreas, pancreatic                            body and pancreatic tail. Normal pancreatic duct.                           - There was no sign of significant pathology in the                            common bile duct and in the common hepatic duct.                           - No malignant-appearing lymph nodes were                            visualized in the celiac region (level 20),                            peripancreatic region and porta hepatis region. Recommendation:           - The patient will be observed post-procedure,  until all discharge criteria are met.                           - Discharge patient to home.                           - Patient has a contact number available for                            emergencies. The signs and symptoms of potential                            delayed complications were discussed with the                            patient. Return to normal activities tomorrow.                            Written discharge instructions were provided to the                            patient.                           - Please use Cepacol or Halls Lozenges +/-                            Chloraseptic spray for next 72-96 hours to aid in                            sore thoat should you experience this.                           - Resume previous diet.                           - Observe patient's clinical course.                           - Await path results.                           - Return to referring physician as previously                            scheduled.                           - If evidence of Barrett's esophagus is found, then                            consider initiation of PPI therapy as there is data                            this may decrease risk of  progression. If no  evidence of Barrett's and just acid-related                            changes, may still consider PPI therapy and go                            based on patient's symptoms.                           - The findings and recommendations were discussed                            with the patient.                           - The findings and recommendations were discussed                            with the referring physician. Procedure Code(s):        --- Professional ---                           [phone], Esophagogastroduodenoscopy, flexible,                            transoral; with endoscopic ultrasound examination                            limited to the esophagus, stomach or duodenum, and                            adjacent structures                           43239, Esophagogastroduodenoscopy, flexible,                            transoral; with biopsy, single or multiple Diagnosis Code(s):        --- Professional ---                           K22.8, Other specified diseases of esophagus                           K44.9, Diaphragmatic hernia without obstruction or                            gangrene                           I89.9, Noninfective disorder of lymphatic vessels                            and lymph nodes, unspecified CPT copyright 2019 American Medical Association. All rights reserved. The codes documented in this report are preliminary and upon coder review may  be revised to meet current compliance requirements. Justice Britain, MD 01/08/2021 9:43:41  AM Number of Addenda: 0

## 2021-01-08 NOTE — Anesthesia Preprocedure Evaluation (Addendum)
Anesthesia Evaluation  Patient identified by MRN, date of birth, ID band Patient awake    Reviewed: Allergy & Precautions, NPO status , Patient's Chart, lab work & pertinent test results  History of Anesthesia Complications (+) PONV  Airway Mallampati: II  TM Distance: >3 FB Neck ROM: Full    Dental  (+) Dental Advisory Given   Pulmonary neg pulmonary ROS,    breath sounds clear to auscultation       Cardiovascular (-) angina Rhythm:Regular Rate:Normal  '21 ECHO: Normal LV systolic function with EF 55%. Normal diastolic filling pattern. 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, mets to liver   Endo/Other  obesity  Renal/GU negative Renal ROS     Musculoskeletal   Abdominal (+) + obese,   Peds  Hematology negative hematology ROS (+)   Anesthesia Other Findings Breast cancer  Reproductive/Obstetrics                            Anesthesia Physical Anesthesia Plan  ASA: 3  Anesthesia Plan: MAC   Post-op Pain Management: Minimal or no pain anticipated   Induction:   PONV Risk Score and Plan: 3 and Ondansetron and Treatment may vary due to age or medical condition  Airway Management Planned: Natural Airway and Nasal Cannula  Additional Equipment: None  Intra-op Plan:   Post-operative Plan:   Informed Consent: I have reviewed the patients History and Physical, chart, labs and discussed the procedure including the risks, benefits and alternatives for the proposed anesthesia with the patient or authorized representative who has indicated his/her understanding and acceptance.     Dental advisory given  Plan Discussed with: CRNA and Surgeon  Anesthesia Plan Comments:        Anesthesia Quick Evaluation

## 2021-01-08 NOTE — Anesthesia Procedure Notes (Signed)
Procedure Name: MAC Date/Time: 01/08/2021 8:30 AM Performed by: Carolan Clines, CRNA Pre-anesthesia Checklist: Patient identified, Emergency Drugs available, Suction available, Patient being monitored and Timeout performed Patient Re-evaluated:Patient Re-evaluated prior to induction Oxygen Delivery Method: Nasal cannula Dental Injury: Teeth and Oropharynx as per pre-operative assessment

## 2021-01-11 ENCOUNTER — Encounter (HOSPITAL_COMMUNITY): Payer: Self-pay | Admitting: Gastroenterology

## 2021-01-14 ENCOUNTER — Other Ambulatory Visit: Payer: Self-pay

## 2021-01-14 DIAGNOSIS — C7A8 Other malignant neuroendocrine tumors: Secondary | ICD-10-CM

## 2021-01-14 DIAGNOSIS — C7B8 Other secondary neuroendocrine tumors: Secondary | ICD-10-CM

## 2021-01-19 LAB — SURGICAL PATHOLOGY

## 2021-01-20 ENCOUNTER — Inpatient Hospital Stay: Payer: No Typology Code available for payment source | Attending: Nurse Practitioner

## 2021-01-20 ENCOUNTER — Inpatient Hospital Stay (HOSPITAL_BASED_OUTPATIENT_CLINIC_OR_DEPARTMENT_OTHER): Payer: No Typology Code available for payment source | Admitting: Nurse Practitioner

## 2021-01-20 ENCOUNTER — Inpatient Hospital Stay: Payer: No Typology Code available for payment source

## 2021-01-20 ENCOUNTER — Encounter: Payer: Self-pay | Admitting: Nurse Practitioner

## 2021-01-20 ENCOUNTER — Other Ambulatory Visit: Payer: Self-pay

## 2021-01-20 VITALS — BP 128/69 | HR 58 | Temp 97.8°F | Resp 18 | Ht 62.0 in | Wt 173.8 lb

## 2021-01-20 DIAGNOSIS — C7B8 Other secondary neuroendocrine tumors: Secondary | ICD-10-CM

## 2021-01-20 DIAGNOSIS — C7A098 Malignant carcinoid tumors of other sites: Secondary | ICD-10-CM | POA: Diagnosis not present

## 2021-01-20 DIAGNOSIS — Z79810 Long term (current) use of selective estrogen receptor modulators (SERMs): Secondary | ICD-10-CM | POA: Insufficient documentation

## 2021-01-20 DIAGNOSIS — E7800 Pure hypercholesterolemia, unspecified: Secondary | ICD-10-CM | POA: Insufficient documentation

## 2021-01-20 DIAGNOSIS — C7B02 Secondary carcinoid tumors of liver: Secondary | ICD-10-CM | POA: Insufficient documentation

## 2021-01-20 DIAGNOSIS — C7A8 Other malignant neuroendocrine tumors: Secondary | ICD-10-CM | POA: Diagnosis not present

## 2021-01-20 DIAGNOSIS — C7B03 Secondary carcinoid tumors of bone: Secondary | ICD-10-CM | POA: Diagnosis present

## 2021-01-20 DIAGNOSIS — Z79899 Other long term (current) drug therapy: Secondary | ICD-10-CM | POA: Diagnosis not present

## 2021-01-20 DIAGNOSIS — D0501 Lobular carcinoma in situ of right breast: Secondary | ICD-10-CM

## 2021-01-20 LAB — CMP (CANCER CENTER ONLY)
ALT: 23 U/L (ref 0–44)
AST: 23 U/L (ref 15–41)
Albumin: 4.3 g/dL (ref 3.5–5.0)
Alkaline Phosphatase: 53 U/L (ref 38–126)
Anion gap: 7 (ref 5–15)
BUN: 11 mg/dL (ref 6–20)
CO2: 27 mmol/L (ref 22–32)
Calcium: 9.6 mg/dL (ref 8.9–10.3)
Chloride: 106 mmol/L (ref 98–111)
Creatinine: 0.77 mg/dL (ref 0.44–1.00)
GFR, Estimated: >60 mL/min (ref >60)
Glucose, Bld: 168 mg/dL — ABNORMAL HIGH (ref 70–99)
Potassium: 4.4 mmol/L (ref 3.5–5.1)
Sodium: 140 mmol/L (ref 135–145)
Total Bilirubin: 0.5 mg/dL (ref 0.3–1.2)
Total Protein: 7 g/dL (ref 6.5–8.1)

## 2021-01-20 LAB — CBC WITH DIFFERENTIAL (CANCER CENTER ONLY)
Abs Immature Granulocytes: 0.01 10*3/uL (ref 0.00–0.07)
Basophils Absolute: 0 10*3/uL (ref 0.0–0.1)
Basophils Relative: 1 %
Eosinophils Absolute: 0.2 10*3/uL (ref 0.0–0.5)
Eosinophils Relative: 3 %
HCT: 36.9 % (ref 36.0–46.0)
Hemoglobin: 12.2 g/dL (ref 12.0–15.0)
Immature Granulocytes: 0 %
Lymphocytes Relative: 27 %
Lymphs Abs: 1.5 10*3/uL (ref 0.7–4.0)
MCH: 31 pg (ref 26.0–34.0)
MCHC: 33.1 g/dL (ref 30.0–36.0)
MCV: 93.7 fL (ref 80.0–100.0)
Monocytes Absolute: 0.4 10*3/uL (ref 0.1–1.0)
Monocytes Relative: 8 %
Neutro Abs: 3.5 10*3/uL (ref 1.7–7.7)
Neutrophils Relative %: 61 %
Platelet Count: 233 10*3/uL (ref 150–400)
RBC: 3.94 MIL/uL (ref 3.87–5.11)
RDW: 13.2 % (ref 11.5–15.5)
WBC Count: 5.7 10*3/uL (ref 4.0–10.5)
nRBC: 0 % (ref 0.0–0.2)

## 2021-01-20 MED ORDER — LANREOTIDE ACETATE 120 MG/0.5ML ~~LOC~~ SOLN
120.0000 mg | Freq: Once | SUBCUTANEOUS | Status: AC
  Administered 2021-01-20: 120 mg via SUBCUTANEOUS

## 2021-01-20 NOTE — Progress Notes (Signed)
[facility] OFFICE PROGRESS NOTE   Diagnosis: Carcinoid tumor  INTERVAL HISTORY:   Sally Brown returns as scheduled.  She continues monthly lanreotide.  She feels well.  No diarrhea.  No flushing episodes.  No abdominal pain.  She continues tamoxifen.  Objective:  Vital signs in last 24 hours:  Blood pressure 128/69, pulse (!) 58, temperature 97.8 F (36.6 C), temperature source Oral, resp. rate 18, height 5\' 2"  (1.575 m), weight 173 lb 12.8 oz (78.8 kg), last menstrual period 05/17/2017, SpO2 100 %.    HEENT: No thrush or ulcers. Resp: Lungs clear bilaterally.   Cardio: Regular rate and rhythm. GI: Abdomen soft and nontender.  No hepatomegaly. Vascular: No leg edema.   Lab Results:  Lab Results  Component Value Date   WBC 5.7 01/20/2021   HGB 12.2 01/20/2021   HCT 36.9 01/20/2021   MCV 93.7 01/20/2021   PLT 233 01/20/2021   NEUTROABS 3.5 01/20/2021    Imaging:  No results found.  Medications: I have reviewed the patient's current medications.  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   06/07/2018 MRI of the liver-numerous hepatic lesions scattered throughout all aspects of the liver.  06/14/2018 PET scan-enlarged and hypermetabolic perihepatic and portacaval lymph nodes; diffuse heterogeneous uptake within the liver with subtle areas of mildly increased FDG uptake corresponding to suspicious lesions identified on MRI.   06/21/2018 biopsy of a right liver lobe mass-low-grade neuroendocrine tumor consistent with metastasis, positive CD56, chromogranin, synaptophysin, CDX-2 and cytokeratin AE1/AE3. 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  Single focus of uptake in the proximal right humerus.  No clear  primary tumor identified. Normal chromogranin A 07/05/2018 and normal 24-hour urine 5 HIAA 08/03/2018 Monthly lanreotide beginning 08/03/2018 CT 12/15/2018-small hepatic metastases, mildly improved, slight increase in size of a porta hepatis node CT abdomen/pelvis 07/02/2019-some of the liver lesions are slightly larger, others stable or smaller, slight increase in splenic lesions, minimal increase in abdominal adenopathy Cycle 1 Lutathera 08/14/2019 Cycle 2 Lutathera 10/08/2019 Cycle 3 Lutathera 12/03/2019 Cycle 4 Lutathera 01/29/2020 Netspot 03/11/2020-marked reduction in activity within hepatic metastases with only 1 remaining measurable lesion, decrease in size and radiotracer activity involving periportal and peripancreatic lymph nodes, decreased radiotracer activity at a subcarinal node and resolution of activity at a left supraclavicular node, resolution of focal metabolic activity in the right humerus, near complete resolution of activity in the left T3 transverse process, decreased activity involving a right acetabular lesion, no evidence of new or progressive disease Monthly lanreotide continued Netspot 12/22/2020 (outside facility)-subjective stability of disease-very small left hepatic lobe and caudate metastatic lesions.  Additional hepatic metastatic lesions not completely excluded.  Metastatic hepatic hilar/portacaval node.  Low suspicion subcarinal lymph node. EUS 01/08/2021-suspicion of Barrett's esophagus 35 cm from the incisors,, no gross lesion in the duodenum or visualized jejunum, no significant pathology in the pancreas, common bile duct, no malignant appearing lymph nodes in the celiac, peripancreatic, porta hepatis region 3.   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 Lindi Adie Bilateral breast MRI 11/11/2020-no MRI evidence for malignancy in either breast. 4.   Hypercholesterolemia 5.   Nausea and vomiting following cycle 1 Lutathera  Disposition:  [name] appears stable.  She remains asymptomatic from the carcinoid tumor.  She had a Netspot study at an outside facility in December, report  indicates stable disease.  Plan to continue monthly lanreotide.  We will follow-up on the chromogranin level from today.  She has scheduled an appointment with Dr. Donne Hazel to discuss breast surgery.  She continues tamoxifen.  Plan to continue monthly lanreotide injections.  She will return for an office visit in 3 months.  We are available to see her sooner if needed.  Patient seen with Dr. Benay Spice.    Ned Card ANP/GNP-BC   01/20/2021  8:50 AM This was a shared visit with Ned Card.  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  The plan is to continue monthly lanreotide.  She will follow-up with Dr. Donne Hazel to discuss management of the right breast LCIS.  I was present for greater than 50% of today's visit.  I performed medical decision making.  Julieanne Manson, MD

## 2021-01-20 NOTE — Patient Instructions (Signed)
Lanreotide injection °What is this medication? °LANREOTIDE (lan REE oh tide) is used to reduce blood levels of growth hormone in patients with a condition called acromegaly. It also works to slow or stop tumor growth in patients with neuroendocrine tumors and treat carcinoid syndrome. °This medicine may be used for other purposes; ask your health care provider or pharmacist if you have questions. °COMMON BRAND NAME(S): Somatuline Depot °What should I tell my care team before I take this medication? °They need to know if you have any of these conditions: °diabetes °gallbladder disease °heart disease °kidney disease °liver disease °thyroid disease °an unusual or allergic reaction to lanreotide, other medicines, foods, dyes, or preservatives °pregnant or trying to get pregnant °breast-feeding °How should I use this medication? °This medicine is for injection under the skin. It is given by a health care professional in a hospital or clinic setting. °Contact your pediatrician or health care professional regarding the use of this medicine in children. Special care may be needed. °Overdosage: If you think you have taken too much of this medicine contact a poison control center or emergency room at once. °NOTE: This medicine is only for you. Do not share this medicine with others. °What if I miss a dose? °It is important not to miss your dose. Call your doctor or health care professional if you are unable to keep an appointment. °What may interact with this medication? °This medicine may interact with the following medications: °bromocriptine °cyclosporine °certain medicines for blood pressure, heart disease, irregular heart beat °certain medicines for diabetes °quinidine °terfenadine °This list may not describe all possible interactions. Give your health care provider a list of all the medicines, herbs, non-prescription drugs, or dietary supplements you use. Also tell them if you smoke, drink alcohol, or use illegal drugs.  Some items may interact with your medicine. °What should I watch for while using this medication? °Tell your doctor or healthcare professional if your symptoms do not start to get better or if they get worse. °Visit your doctor or health care professional for regular checks on your progress. Your condition will be monitored carefully while you are receiving this medicine. °This medicine may increase blood sugar. Ask your healthcare provider if changes in diet or medicines are needed if you have diabetes. °You may need blood work done while you are taking this medicine. °Women should inform their doctor if they wish to become pregnant or think they might be pregnant. There is a potential for serious side effects to an unborn child. Talk to your health care professional or pharmacist for more information. Do not breast-feed an infant while taking this medicine or for 6 months after stopping it. °This medicine has caused ovarian failure in some women. This medicine may interfere with the ability to have a child. Talk with your doctor or health care professional if you are concerned about your fertility. °What side effects may I notice from receiving this medication? °Side effects that you should report to your doctor or health care professional as soon as possible: °allergic reactions like skin rash, itching or hives, swelling of the face, lips, or tongue °increased blood pressure °severe stomach pain °signs and symptoms of hgh blood sugar such as being more thirsty or hungry or having to urinate more than normal. You may also feel very tired or have blurry vision. °signs and symptoms of low blood sugar such as feeling anxious; confusion; dizziness; increased hunger; unusually weak or tired; sweating; shakiness; cold; irritable; headache; blurred vision; fast   heartbeat; loss of consciousness °unusually slow heartbeat °Side effects that usually do not require medical attention (report to your doctor or health care  professional if they continue or are bothersome): °constipation °diarrhea °dizziness °headache °muscle pain °muscle spasms °nausea °pain, redness, or irritation at site where injected °This list may not describe all possible side effects. Call your doctor for medical advice about side effects. You may report side effects to FDA at 1-800-FDA-1088. °Where should I keep my medication? °This drug is given in a hospital or clinic and will not be stored at home. °NOTE: This sheet is a summary. It may not cover all possible information. If you have questions about this medicine, talk to your doctor, pharmacist, or health care provider. °© 2022 Elsevier/Gold Standard (2017-11-16 00:00:00) ° °

## 2021-01-21 LAB — CHROMOGRANIN A: Chromogranin A (ng/mL): 24.9 ng/mL (ref 0.0–101.8)

## 2021-01-28 ENCOUNTER — Encounter: Payer: Self-pay | Admitting: Gastroenterology

## 2021-02-04 ENCOUNTER — Other Ambulatory Visit: Payer: Self-pay | Admitting: Gastroenterology

## 2021-02-04 ENCOUNTER — Encounter: Payer: Self-pay | Admitting: Oncology

## 2021-02-04 ENCOUNTER — Ambulatory Visit
Admission: RE | Admit: 2021-02-04 | Discharge: 2021-02-04 | Disposition: A | Discharge status: Home / Self Care | Payer: No Typology Code available for payment source | Source: Ambulatory Visit | Attending: Gastroenterology | Admitting: Gastroenterology

## 2021-02-04 DIAGNOSIS — R059 Cough, unspecified: Secondary | ICD-10-CM

## 2021-02-17 ENCOUNTER — Other Ambulatory Visit: Payer: Self-pay

## 2021-02-17 ENCOUNTER — Inpatient Hospital Stay: Payer: No Typology Code available for payment source | Attending: Nurse Practitioner

## 2021-02-17 VITALS — BP 129/66 | HR 60 | Temp 98.0°F | Resp 18 | Ht 62.0 in | Wt 173.2 lb

## 2021-02-17 DIAGNOSIS — Z79899 Other long term (current) drug therapy: Secondary | ICD-10-CM | POA: Diagnosis not present

## 2021-02-17 DIAGNOSIS — C7B03 Secondary carcinoid tumors of bone: Secondary | ICD-10-CM | POA: Insufficient documentation

## 2021-02-17 DIAGNOSIS — C7B02 Secondary carcinoid tumors of liver: Secondary | ICD-10-CM | POA: Diagnosis not present

## 2021-02-17 DIAGNOSIS — C7A098 Malignant carcinoid tumors of other sites: Secondary | ICD-10-CM | POA: Insufficient documentation

## 2021-02-17 DIAGNOSIS — C7A8 Other malignant neuroendocrine tumors: Secondary | ICD-10-CM

## 2021-02-17 MED ORDER — LANREOTIDE ACETATE 120 MG/0.5ML ~~LOC~~ SOLN
120.0000 mg | Freq: Once | SUBCUTANEOUS | Status: AC
  Administered 2021-02-17: 120 mg via SUBCUTANEOUS

## 2021-02-17 NOTE — Patient Instructions (Signed)
Sally Brown   Discharge Instructions: Thank you for choosing [facility] to provide your oncology and hematology care.   If you have a lab appointment with the [facility], please go directly to the [facility] and check in at the registration area.   Wear comfortable clothing and clothing appropriate for easy access to any Portacath or PICC line.   We strive to give you quality time with your provider. You may need to reschedule your appointment if you arrive late (15 or more minutes).  Arriving late affects you and other patients whose appointments are after yours.  Also, if you miss three or more appointments without notifying the office, you may be dismissed from the clinic at the providers discretion.      For prescription refill requests, have your pharmacy contact our office and allow 72 hours for refills to be completed.    Today you received the following chemotherapy and/or immunotherapy agents Lanreotide      To help prevent nausea and vomiting after your treatment, we encourage you to take your nausea medication as directed.  BELOW ARE SYMPTOMS THAT SHOULD BE REPORTED IMMEDIATELY: *FEVER GREATER THAN 100.4 F (38 C) OR HIGHER *CHILLS OR SWEATING *NAUSEA AND VOMITING THAT IS NOT CONTROLLED WITH YOUR NAUSEA MEDICATION *UNUSUAL SHORTNESS OF BREATH *UNUSUAL BRUISING OR BLEEDING *URINARY PROBLEMS (pain or burning when urinating, or frequent urination) *BOWEL PROBLEMS (unusual diarrhea, constipation, pain near the anus) TENDERNESS IN MOUTH AND THROAT WITH OR WITHOUT PRESENCE OF ULCERS (sore throat, sores in mouth, or a toothache) UNUSUAL RASH, SWELLING OR PAIN  UNUSUAL VAGINAL DISCHARGE OR ITCHING   Items with * indicate a potential emergency and should be followed up as soon as possible or go to the Emergency Department if any problems should occur.  Please show the CHEMOTHERAPY ALERT CARD or IMMUNOTHERAPY ALERT CARD at check-in to  the Emergency Department and triage nurse.  Should you have questions after your visit or need to cancel or reschedule your appointment, please contact [facility]  Dept: [phone]  and follow the prompts.  Office hours are 8:00 a.m. to 4:30 p.m. Monday - Friday. Please note that voicemails left after 4:00 p.m. may not be returned until the following business day.  We are closed weekends and major holidays. You have access to a nurse at all times for urgent questions. Please call the main number to the clinic Dept: [phone] and follow the prompts.   For any non-urgent questions, you may also contact your provider using MyChart. We now offer e-Visits for anyone 39 and older to request care online for non-urgent symptoms. For details visit [email or web address].   Also download the MyChart app! Go to the app store, search "MyChart", open the app, select [facility], and log in with your MyChart username and password.  Due to Covid, a mask is required upon entering the hospital/clinic. If you do not have a mask, one will be given to you upon arrival. For doctor visits, patients may have 1 support person aged 72 or older with them. For treatment visits, patients cannot have anyone with them due to current Covid guidelines and our immunocompromised population.   Lanreotide injection What is this medication? LANREOTIDE (lan REE oh tide) is used to reduce blood levels of growth hormone in patients with a condition called acromegaly. It also works to slow or stop tumor growth in patients with neuroendocrine tumors and treat carcinoid syndrome. This medicine may  be used for other purposes; ask your health care provider or pharmacist if you have questions. COMMON BRAND NAME(S): Somatuline Depot What should I tell my care team before I take this medication? They need to know if you have any of these conditions: diabetes gallbladder disease heart disease kidney  disease liver disease thyroid disease an unusual or allergic reaction to lanreotide, other medicines, foods, dyes, or preservatives pregnant or trying to get pregnant breast-feeding How should I use this medication? This medicine is for injection under the skin. It is given by a health care professional in a hospital or clinic setting. Contact your pediatrician or health care professional regarding the use of this medicine in children. Special care may be needed. Overdosage: If you think you have taken too much of this medicine contact a poison control center or emergency room at once. NOTE: This medicine is only for you. Do not share this medicine with others. What if I miss a dose? It is important not to miss your dose. Call your doctor or health care professional if you are unable to keep an appointment. What may interact with this medication? This medicine may interact with the following medications: bromocriptine cyclosporine certain medicines for blood pressure, heart disease, irregular heart beat certain medicines for diabetes quinidine terfenadine This list may not describe all possible interactions. Give your health care provider a list of all the medicines, herbs, non-prescription drugs, or dietary supplements you use. Also tell them if you smoke, drink alcohol, or use illegal drugs. Some items may interact with your medicine. What should I watch for while using this medication? Tell your doctor or healthcare professional if your symptoms do not start to get better or if they get worse. Visit your doctor or health care professional for regular checks on your progress. Your condition will be monitored carefully while you are receiving this medicine. This medicine may increase blood sugar. Ask your healthcare provider if changes in diet or medicines are needed if you have diabetes. You may need blood work done while you are taking this medicine. Women should inform their doctor if  they wish to become pregnant or think they might be pregnant. There is a potential for serious side effects to an unborn child. Talk to your health care professional or pharmacist for more information. Do not breast-feed an infant while taking this medicine or for 6 months after stopping it. This medicine has caused ovarian failure in some women. This medicine may interfere with the ability to have a child. Talk with your doctor or health care professional if you are concerned about your fertility. What side effects may I notice from receiving this medication? Side effects that you should report to your doctor or health care professional as soon as possible: allergic reactions like skin rash, itching or hives, swelling of the face, lips, or tongue increased blood pressure severe stomach pain signs and symptoms of hgh blood sugar such as being more thirsty or hungry or having to urinate more than normal. You may also feel very tired or have blurry vision. signs and symptoms of low blood sugar such as feeling anxious; confusion; dizziness; increased hunger; unusually weak or tired; sweating; shakiness; cold; irritable; headache; blurred vision; fast heartbeat; loss of consciousness unusually slow heartbeat Side effects that usually do not require medical attention (report to your doctor or health care professional if they continue or are bothersome): constipation diarrhea dizziness headache muscle pain muscle spasms nausea pain, redness, or irritation at site where injected  This list may not describe all possible side effects. Call your doctor for medical advice about side effects. You may report side effects to FDA at 1-800-FDA-1088. Where should I keep my medication? This drug is given in a hospital or clinic and will not be stored at home. NOTE: This sheet is a summary. It may not cover all possible information. If you have questions about this medicine, talk to your doctor, pharmacist, or  health care provider.  2022 Elsevier/Gold Standard (2017-11-16 00:00:00)

## 2021-03-17 ENCOUNTER — Other Ambulatory Visit: Payer: Self-pay

## 2021-03-17 ENCOUNTER — Inpatient Hospital Stay: Payer: No Typology Code available for payment source | Attending: Nurse Practitioner

## 2021-03-17 VITALS — BP 97/71 | HR 67 | Temp 98.1°F | Resp 18

## 2021-03-17 DIAGNOSIS — Z79899 Other long term (current) drug therapy: Secondary | ICD-10-CM | POA: Insufficient documentation

## 2021-03-17 DIAGNOSIS — C7A098 Malignant carcinoid tumors of other sites: Secondary | ICD-10-CM | POA: Insufficient documentation

## 2021-03-17 DIAGNOSIS — C7B8 Other secondary neuroendocrine tumors: Secondary | ICD-10-CM

## 2021-03-17 DIAGNOSIS — C7A8 Other malignant neuroendocrine tumors: Secondary | ICD-10-CM

## 2021-03-17 MED ORDER — LANREOTIDE ACETATE 120 MG/0.5ML ~~LOC~~ SOLN
120.0000 mg | Freq: Once | SUBCUTANEOUS | Status: AC
  Administered 2021-03-17: 120 mg via SUBCUTANEOUS
  Filled 2021-03-17: qty 120

## 2021-03-17 NOTE — Patient Instructions (Signed)
Lanreotide injection °What is this medication? °LANREOTIDE (lan REE oh tide) is used to reduce blood levels of growth hormone in patients with a condition called acromegaly. It also works to slow or stop tumor growth in patients with neuroendocrine tumors and treat carcinoid syndrome. °This medicine may be used for other purposes; ask your health care provider or pharmacist if you have questions. °COMMON BRAND NAME(S): Somatuline Depot °What should I tell my care team before I take this medication? °They need to know if you have any of these conditions: °diabetes °gallbladder disease °heart disease °kidney disease °liver disease °thyroid disease °an unusual or allergic reaction to lanreotide, other medicines, foods, dyes, or preservatives °pregnant or trying to get pregnant °breast-feeding °How should I use this medication? °This medicine is for injection under the skin. It is given by a health care professional in a hospital or clinic setting. °Contact your pediatrician or health care professional regarding the use of this medicine in children. Special care may be needed. °Overdosage: If you think you have taken too much of this medicine contact a poison control center or emergency room at once. °NOTE: This medicine is only for you. Do not share this medicine with others. °What if I miss a dose? °It is important not to miss your dose. Call your doctor or health care professional if you are unable to keep an appointment. °What may interact with this medication? °This medicine may interact with the following medications: °bromocriptine °cyclosporine °certain medicines for blood pressure, heart disease, irregular heart beat °certain medicines for diabetes °quinidine °terfenadine °This list may not describe all possible interactions. Give your health care provider a list of all the medicines, herbs, non-prescription drugs, or dietary supplements you use. Also tell them if you smoke, drink alcohol, or use illegal drugs.  Some items may interact with your medicine. °What should I watch for while using this medication? °Tell your doctor or healthcare professional if your symptoms do not start to get better or if they get worse. °Visit your doctor or health care professional for regular checks on your progress. Your condition will be monitored carefully while you are receiving this medicine. °This medicine may increase blood sugar. Ask your healthcare provider if changes in diet or medicines are needed if you have diabetes. °You may need blood work done while you are taking this medicine. °Women should inform their doctor if they wish to become pregnant or think they might be pregnant. There is a potential for serious side effects to an unborn child. Talk to your health care professional or pharmacist for more information. Do not breast-feed an infant while taking this medicine or for 6 months after stopping it. °This medicine has caused ovarian failure in some women. This medicine may interfere with the ability to have a child. Talk with your doctor or health care professional if you are concerned about your fertility. °What side effects may I notice from receiving this medication? °Side effects that you should report to your doctor or health care professional as soon as possible: °allergic reactions like skin rash, itching or hives, swelling of the face, lips, or tongue °increased blood pressure °severe stomach pain °signs and symptoms of hgh blood sugar such as being more thirsty or hungry or having to urinate more than normal. You may also feel very tired or have blurry vision. °signs and symptoms of low blood sugar such as feeling anxious; confusion; dizziness; increased hunger; unusually weak or tired; sweating; shakiness; cold; irritable; headache; blurred vision; fast   heartbeat; loss of consciousness °unusually slow heartbeat °Side effects that usually do not require medical attention (report to your doctor or health care  professional if they continue or are bothersome): °constipation °diarrhea °dizziness °headache °muscle pain °muscle spasms °nausea °pain, redness, or irritation at site where injected °This list may not describe all possible side effects. Call your doctor for medical advice about side effects. You may report side effects to FDA at 1-800-FDA-1088. °Where should I keep my medication? °This drug is given in a hospital or clinic and will not be stored at home. °NOTE: This sheet is a summary. It may not cover all possible information. If you have questions about this medicine, talk to your doctor, pharmacist, or health care provider. °© 2022 Elsevier/Gold Standard (2017-11-16 00:00:00) ° °

## 2021-04-01 ENCOUNTER — Other Ambulatory Visit: Payer: Self-pay | Admitting: Family Medicine

## 2021-04-01 DIAGNOSIS — Z1231 Encounter for screening mammogram for malignant neoplasm of breast: Secondary | ICD-10-CM

## 2021-04-14 ENCOUNTER — Inpatient Hospital Stay: Payer: No Typology Code available for payment source

## 2021-04-14 ENCOUNTER — Inpatient Hospital Stay: Payer: No Typology Code available for payment source | Attending: Nurse Practitioner | Admitting: Oncology

## 2021-04-14 VITALS — BP 116/80 | HR 98 | Temp 100.0°F | Resp 18 | Ht 62.0 in | Wt 173.4 lb

## 2021-04-14 DIAGNOSIS — D0501 Lobular carcinoma in situ of right breast: Secondary | ICD-10-CM | POA: Diagnosis not present

## 2021-04-14 DIAGNOSIS — Z79899 Other long term (current) drug therapy: Secondary | ICD-10-CM | POA: Diagnosis not present

## 2021-04-14 DIAGNOSIS — C7A098 Malignant carcinoid tumors of other sites: Secondary | ICD-10-CM | POA: Diagnosis present

## 2021-04-14 DIAGNOSIS — Z79810 Long term (current) use of selective estrogen receptor modulators (SERMs): Secondary | ICD-10-CM | POA: Diagnosis not present

## 2021-04-14 DIAGNOSIS — R112 Nausea with vomiting, unspecified: Secondary | ICD-10-CM | POA: Insufficient documentation

## 2021-04-14 DIAGNOSIS — C7B8 Other secondary neuroendocrine tumors: Secondary | ICD-10-CM | POA: Diagnosis not present

## 2021-04-14 DIAGNOSIS — E7800 Pure hypercholesterolemia, unspecified: Secondary | ICD-10-CM | POA: Insufficient documentation

## 2021-04-14 DIAGNOSIS — C7A8 Other malignant neuroendocrine tumors: Secondary | ICD-10-CM

## 2021-04-14 LAB — CBC WITH DIFFERENTIAL (CANCER CENTER ONLY)
Abs Immature Granulocytes: 0.01 10*3/uL (ref 0.00–0.07)
Basophils Absolute: 0 10*3/uL (ref 0.0–0.1)
Basophils Relative: 1 %
Eosinophils Absolute: 0.2 10*3/uL (ref 0.0–0.5)
Eosinophils Relative: 4 %
HCT: 38 % (ref 36.0–46.0)
Hemoglobin: 12.7 g/dL (ref 12.0–15.0)
Immature Granulocytes: 0 %
Lymphocytes Relative: 27 %
Lymphs Abs: 1.6 10*3/uL (ref 0.7–4.0)
MCH: 30.7 pg (ref 26.0–34.0)
MCHC: 33.4 g/dL (ref 30.0–36.0)
MCV: 91.8 fL (ref 80.0–100.0)
Monocytes Absolute: 0.5 10*3/uL (ref 0.1–1.0)
Monocytes Relative: 9 %
Neutro Abs: 3.6 10*3/uL (ref 1.7–7.7)
Neutrophils Relative %: 59 %
Platelet Count: 237 10*3/uL (ref 150–400)
RBC: 4.14 MIL/uL (ref 3.87–5.11)
RDW: 13.2 % (ref 11.5–15.5)
WBC Count: 6 10*3/uL (ref 4.0–10.5)
nRBC: 0 % (ref 0.0–0.2)

## 2021-04-14 LAB — CMP (CANCER CENTER ONLY)
ALT: 24 U/L (ref 0–44)
AST: 21 U/L (ref 15–41)
Albumin: 4.4 g/dL (ref 3.5–5.0)
Alkaline Phosphatase: 67 U/L (ref 38–126)
Anion gap: 10 (ref 5–15)
BUN: 19 mg/dL (ref 6–20)
CO2: 27 mmol/L (ref 22–32)
Calcium: 10.2 mg/dL (ref 8.9–10.3)
Chloride: 103 mmol/L (ref 98–111)
Creatinine: 0.81 mg/dL (ref 0.44–1.00)
GFR, Estimated: >60 mL/min (ref >60)
Glucose, Bld: 139 mg/dL — ABNORMAL HIGH (ref 70–99)
Potassium: 4.7 mmol/L (ref 3.5–5.1)
Sodium: 140 mmol/L (ref 135–145)
Total Bilirubin: 0.5 mg/dL (ref 0.3–1.2)
Total Protein: 7.5 g/dL (ref 6.5–8.1)

## 2021-04-14 MED ORDER — LANREOTIDE ACETATE 120 MG/0.5ML ~~LOC~~ SOLN
120.0000 mg | Freq: Once | SUBCUTANEOUS | Status: AC
  Administered 2021-04-14: 120 mg via SUBCUTANEOUS
  Filled 2021-04-14: qty 120

## 2021-04-14 NOTE — Patient Instructions (Signed)
Lanreotide injection °What is this medication? °LANREOTIDE (lan REE oh tide) is used to reduce blood levels of growth hormone in patients with a condition called acromegaly. It also works to slow or stop tumor growth in patients with neuroendocrine tumors and treat carcinoid syndrome. °This medicine may be used for other purposes; ask your health care provider or pharmacist if you have questions. °COMMON BRAND NAME(S): Somatuline Depot °What should I tell my care team before I take this medication? °They need to know if you have any of these conditions: °diabetes °gallbladder disease °heart disease °kidney disease °liver disease °thyroid disease °an unusual or allergic reaction to lanreotide, other medicines, foods, dyes, or preservatives °pregnant or trying to get pregnant °breast-feeding °How should I use this medication? °This medicine is for injection under the skin. It is given by a health care professional in a hospital or clinic setting. °Contact your pediatrician or health care professional regarding the use of this medicine in children. Special care may be needed. °Overdosage: If you think you have taken too much of this medicine contact a poison control center or emergency room at once. °NOTE: This medicine is only for you. Do not share this medicine with others. °What if I miss a dose? °It is important not to miss your dose. Call your doctor or health care professional if you are unable to keep an appointment. °What may interact with this medication? °This medicine may interact with the following medications: °bromocriptine °cyclosporine °certain medicines for blood pressure, heart disease, irregular heart beat °certain medicines for diabetes °quinidine °terfenadine °This list may not describe all possible interactions. Give your health care provider a list of all the medicines, herbs, non-prescription drugs, or dietary supplements you use. Also tell them if you smoke, drink alcohol, or use illegal drugs.  Some items may interact with your medicine. °What should I watch for while using this medication? °Tell your doctor or healthcare professional if your symptoms do not start to get better or if they get worse. °Visit your doctor or health care professional for regular checks on your progress. Your condition will be monitored carefully while you are receiving this medicine. °This medicine may increase blood sugar. Ask your healthcare provider if changes in diet or medicines are needed if you have diabetes. °You may need blood work done while you are taking this medicine. °Women should inform their doctor if they wish to become pregnant or think they might be pregnant. There is a potential for serious side effects to an unborn child. Talk to your health care professional or pharmacist for more information. Do not breast-feed an infant while taking this medicine or for 6 months after stopping it. °This medicine has caused ovarian failure in some women. This medicine may interfere with the ability to have a child. Talk with your doctor or health care professional if you are concerned about your fertility. °What side effects may I notice from receiving this medication? °Side effects that you should report to your doctor or health care professional as soon as possible: °allergic reactions like skin rash, itching or hives, swelling of the face, lips, or tongue °increased blood pressure °severe stomach pain °signs and symptoms of hgh blood sugar such as being more thirsty or hungry or having to urinate more than normal. You may also feel very tired or have blurry vision. °signs and symptoms of low blood sugar such as feeling anxious; confusion; dizziness; increased hunger; unusually weak or tired; sweating; shakiness; cold; irritable; headache; blurred vision; fast   heartbeat; loss of consciousness °unusually slow heartbeat °Side effects that usually do not require medical attention (report to your doctor or health care  professional if they continue or are bothersome): °constipation °diarrhea °dizziness °headache °muscle pain °muscle spasms °nausea °pain, redness, or irritation at site where injected °This list may not describe all possible side effects. Call your doctor for medical advice about side effects. You may report side effects to FDA at 1-800-FDA-1088. °Where should I keep my medication? °This drug is given in a hospital or clinic and will not be stored at home. °NOTE: This sheet is a summary. It may not cover all possible information. If you have questions about this medicine, talk to your doctor, pharmacist, or health care provider. °© 2022 Elsevier/Gold Standard (2017-11-16 00:00:00) ° °

## 2021-04-14 NOTE — Progress Notes (Signed)
?[facility] ?OFFICE PROGRESS NOTE ? ? ?Diagnosis: Carcinoid tumor ? ?INTERVAL HISTORY:  ? ?Sally Brown returns as scheduled.  She continues monthly lanreotide.  No flushing, diarrhea, or abdominal pain.  No complaint.  She continues tamoxifen.  No hot flashes. ? ?Objective: ? ?Vital signs in last 24 hours: ? ?Blood pressure 116/80, pulse 98, temperature 100 ?F (37.8 ?C), resp. rate 18, height '5\' 2"'$  (1.575 m), weight 173 lb 6.4 oz (78.7 kg), last menstrual period 05/17/2017. ?  ? ?Lymphatics: No cervical, supraclavicular, axillary, or inguinal nodes ?Resp: Lungs clear bilaterally ?Cardio: Regular rate and rhythm ?GI: No hepatosplenomegaly, no mass, nontender ?Vascular: No leg edema ?Lab Results: ? ?Lab Results  ?Component Value Date  ? WBC 6.0 04/14/2021  ? HGB 12.7 04/14/2021  ? HCT 38.0 04/14/2021  ? MCV 91.8 04/14/2021  ? PLT 237 04/14/2021  ? NEUTROABS 3.6 04/14/2021  ? ? ?CMP  ?Lab Results  ?Component Value Date  ? NA 140 01/20/2021  ? K 4.4 01/20/2021  ? CL 106 01/20/2021  ? CO2 27 01/20/2021  ? GLUCOSE 168 (H) 01/20/2021  ? BUN 11 01/20/2021  ? CREATININE 0.77 01/20/2021  ? CALCIUM 9.6 01/20/2021  ? PROT 7.0 01/20/2021  ? ALBUMIN 4.3 01/20/2021  ? AST 23 01/20/2021  ? ALT 23 01/20/2021  ? ALKPHOS 53 01/20/2021  ? BILITOT 0.5 01/20/2021  ? GFRNONAA >60 01/20/2021  ? GFRAA >60 10/08/2019  ? ? ?Medications: I have reviewed the patient's current medications. ? ? ?Assessment/Plan: ? ?Metastatic carcinoid involving liver ?05/31/2018 breast MRI-numerous T2 lesions throughout the liver.   ?06/07/2018 MRI of the liver-numerous hepatic lesions scattered throughout all aspects of the liver.  ?06/14/2018 PET scan-enlarged and hypermetabolic perihepatic and portacaval lymph nodes; diffuse heterogeneous uptake within the liver with subtle areas of mildly increased FDG uptake corresponding to suspicious lesions identified on MRI.   ?06/21/2018 biopsy of a right liver lobe mass-low-grade neuroendocrine tumor  consistent with metastasis, positive CD56, chromogranin, synaptophysin, CDX-2 and cytokeratin AE1/AE3. ?07/07/2018 Netspot-  well-differentiated neuroendocrine tumor with metastasis to multiple periportal and peripancreatic lymph nodes, multiple small bilobar hepatic metastasis, solitary middle mediastinal nodal metastasis.  Single focus of uptake in the proximal right humerus.  No clear primary tumor identified. ?Normal chromogranin A 07/05/2018 and normal 24-hour urine 5 HIAA 08/03/2018 ?Monthly lanreotide beginning 08/03/2018 ?CT 12/15/2018-small hepatic metastases, mildly improved, slight increase in size of a porta hepatis node ?CT abdomen/pelvis 07/02/2019-some of the liver lesions are slightly larger, others stable or smaller, slight increase in splenic lesions, minimal increase in abdominal adenopathy ?Cycle 1 Lutathera 08/14/2019 ?Cycle 2 Lutathera 10/08/2019 ?Cycle 3 Lutathera 12/03/2019 ?Cycle 4 Lutathera 01/29/2020 ?Netspot 03/11/2020-marked reduction in activity within hepatic metastases with only 1 remaining measurable lesion, decrease in size and radiotracer activity involving periportal and peripancreatic lymph nodes, decreased radiotracer activity at a subcarinal node and resolution of activity at a left supraclavicular node, resolution of focal metabolic activity in the right humerus, near complete resolution of activity in the left T3 transverse process, decreased activity involving a right acetabular lesion, no evidence of new or progressive disease ?Monthly lanreotide continued ?Netspot 12/22/2020 (outside facility)-subjective stability of disease-very small left hepatic lobe and caudate metastatic lesions.  Additional hepatic metastatic lesions not completely excluded.  Metastatic hepatic hilar/portacaval node.  Low suspicion subcarinal lymph node. ?EUS 01/08/2021-suspicion of Barrett's esophagus 35 cm from the incisors,, no gross lesion in the duodenum or visualized jejunum, no significant pathology in  the pancreas, common bile duct, no malignant appearing lymph  nodes in the celiac, peripancreatic, porta hepatis region ?3.   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 Donne Hazel ?Bilateral breast MRI 11/11/2020-no MRI evidence for malignancy in either breast. ?4.   Hypercholesterolemia ?5.   Nausea and vomiting following cycle 1 Lutathera ? ? ?Disposition: ?Sally Brown appears stable.  She will continue monthly lanreotide.  We will follow-up on the chromogranin a level from today.  She will be scheduled for a restaging Netspot in December.  She will return for an office visit in 4 months. ? 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  She will continue follow-up with Dr. Donne Hazel. ? ?Sally Coder, MD ? ?04/14/2021  ?8:35 AM ? ? ?

## 2021-04-16 LAB — CHROMOGRANIN A: Chromogranin A (ng/mL): 32.2 ng/mL (ref 0.0–101.8)

## 2021-05-12 ENCOUNTER — Inpatient Hospital Stay: Payer: No Typology Code available for payment source | Attending: Nurse Practitioner

## 2021-05-12 VITALS — BP 100/66 | HR 60 | Temp 98.2°F | Resp 20 | Ht 62.0 in | Wt 174.2 lb

## 2021-05-12 DIAGNOSIS — C7A8 Other malignant neuroendocrine tumors: Secondary | ICD-10-CM

## 2021-05-12 DIAGNOSIS — C7A098 Malignant carcinoid tumors of other sites: Secondary | ICD-10-CM | POA: Insufficient documentation

## 2021-05-12 DIAGNOSIS — Z79899 Other long term (current) drug therapy: Secondary | ICD-10-CM | POA: Insufficient documentation

## 2021-05-12 MED ORDER — LANREOTIDE ACETATE 120 MG/0.5ML ~~LOC~~ SOLN
120.0000 mg | Freq: Once | SUBCUTANEOUS | Status: AC
  Administered 2021-05-12: 120 mg via SUBCUTANEOUS

## 2021-05-12 NOTE — Patient Instructions (Signed)
Lanreotide injection What is this medication? LANREOTIDE (lan REE oh tide) is used to reduce blood levels of growth hormone in patients with a condition called acromegaly. It also works to slow or stop tumor growth in patients with neuroendocrine tumors and treat carcinoid syndrome. This medicine may be used for other purposes; ask your health care provider or pharmacist if you have questions. COMMON BRAND NAME(S): Somatuline Depot What should I tell my care team before I take this medication? They need to know if you have any of these conditions: diabetes gallbladder disease heart disease kidney disease liver disease thyroid disease an unusual or allergic reaction to lanreotide, other medicines, foods, dyes, or preservatives pregnant or trying to get pregnant breast-feeding How should I use this medication? This medicine is for injection under the skin. It is given by a health care professional in a hospital or clinic setting. Contact your pediatrician or health care professional regarding the use of this medicine in children. Special care may be needed. Overdosage: If you think you have taken too much of this medicine contact a poison control center or emergency room at once. NOTE: This medicine is only for you. Do not share this medicine with others. What if I miss a dose? It is important not to miss your dose. Call your doctor or health care professional if you are unable to keep an appointment. What may interact with this medication? This medicine may interact with the following medications: bromocriptine cyclosporine certain medicines for blood pressure, heart disease, irregular heart beat certain medicines for diabetes quinidine terfenadine This list may not describe all possible interactions. Give your health care provider a list of all the medicines, herbs, non-prescription drugs, or dietary supplements you use. Also tell them if you smoke, drink alcohol, or use illegal drugs.  Some items may interact with your medicine. What should I watch for while using this medication? Tell your doctor or healthcare professional if your symptoms do not start to get better or if they get worse. Visit your doctor or health care professional for regular checks on your progress. Your condition will be monitored carefully while you are receiving this medicine. This medicine may increase blood sugar. Ask your healthcare provider if changes in diet or medicines are needed if you have diabetes. You may need blood work done while you are taking this medicine. Women should inform their doctor if they wish to become pregnant or think they might be pregnant. There is a potential for serious side effects to an unborn child. Talk to your health care professional or pharmacist for more information. Do not breast-feed an infant while taking this medicine or for 6 months after stopping it. This medicine has caused ovarian failure in some women. This medicine may interfere with the ability to have a child. Talk with your doctor or health care professional if you are concerned about your fertility. What side effects may I notice from receiving this medication? Side effects that you should report to your doctor or health care professional as soon as possible: allergic reactions like skin rash, itching or hives, swelling of the face, lips, or tongue increased blood pressure severe stomach pain signs and symptoms of hgh blood sugar such as being more thirsty or hungry or having to urinate more than normal. You may also feel very tired or have blurry vision. signs and symptoms of low blood sugar such as feeling anxious; confusion; dizziness; increased hunger; unusually weak or tired; sweating; shakiness; cold; irritable; headache; blurred vision; fast   heartbeat; loss of consciousness unusually slow heartbeat Side effects that usually do not require medical attention (report to your doctor or health care  professional if they continue or are bothersome): constipation diarrhea dizziness headache muscle pain muscle spasms nausea pain, redness, or irritation at site where injected This list may not describe all possible side effects. Call your doctor for medical advice about side effects. You may report side effects to FDA at 1-800-FDA-1088. Where should I keep my medication? This drug is given in a hospital or clinic and will not be stored at home. NOTE: This sheet is a summary. It may not cover all possible information. If you have questions about this medicine, talk to your doctor, pharmacist, or health care provider.  2023 Elsevier/Gold Standard (2017-11-16 00:00:00)  

## 2021-06-02 ENCOUNTER — Other Ambulatory Visit: Payer: Self-pay | Admitting: Hematology and Oncology

## 2021-06-09 ENCOUNTER — Inpatient Hospital Stay: Payer: No Typology Code available for payment source

## 2021-06-09 VITALS — BP 107/53 | HR 67 | Temp 98.0°F | Resp 18

## 2021-06-09 DIAGNOSIS — C7A098 Malignant carcinoid tumors of other sites: Secondary | ICD-10-CM | POA: Diagnosis not present

## 2021-06-09 DIAGNOSIS — C7B8 Other secondary neuroendocrine tumors: Secondary | ICD-10-CM

## 2021-06-09 MED ORDER — LANREOTIDE ACETATE 120 MG/0.5ML ~~LOC~~ SOLN
120.0000 mg | Freq: Once | SUBCUTANEOUS | Status: AC
  Administered 2021-06-09: 120 mg via SUBCUTANEOUS

## 2021-06-09 NOTE — Patient Instructions (Signed)
Lanreotide injection What is this medication? LANREOTIDE (lan REE oh tide) is used to reduce blood levels of growth hormone in patients with a condition called acromegaly. It also works to slow or stop tumor growth in patients with neuroendocrine tumors and treat carcinoid syndrome. This medicine may be used for other purposes; ask your health care provider or pharmacist if you have questions. COMMON BRAND NAME(S): Somatuline Depot What should I tell my care team before I take this medication? They need to know if you have any of these conditions: diabetes gallbladder disease heart disease kidney disease liver disease thyroid disease an unusual or allergic reaction to lanreotide, other medicines, foods, dyes, or preservatives pregnant or trying to get pregnant breast-feeding How should I use this medication? This medicine is for injection under the skin. It is given by a health care professional in a hospital or clinic setting. Contact your pediatrician or health care professional regarding the use of this medicine in children. Special care may be needed. Overdosage: If you think you have taken too much of this medicine contact a poison control center or emergency room at once. NOTE: This medicine is only for you. Do not share this medicine with others. What if I miss a dose? It is important not to miss your dose. Call your doctor or health care professional if you are unable to keep an appointment. What may interact with this medication? This medicine may interact with the following medications: bromocriptine cyclosporine certain medicines for blood pressure, heart disease, irregular heart beat certain medicines for diabetes quinidine terfenadine This list may not describe all possible interactions. Give your health care provider a list of all the medicines, herbs, non-prescription drugs, or dietary supplements you use. Also tell them if you smoke, drink alcohol, or use illegal drugs.  Some items may interact with your medicine. What should I watch for while using this medication? Tell your doctor or healthcare professional if your symptoms do not start to get better or if they get worse. Visit your doctor or health care professional for regular checks on your progress. Your condition will be monitored carefully while you are receiving this medicine. This medicine may increase blood sugar. Ask your healthcare provider if changes in diet or medicines are needed if you have diabetes. You may need blood work done while you are taking this medicine. Women should inform their doctor if they wish to become pregnant or think they might be pregnant. There is a potential for serious side effects to an unborn child. Talk to your health care professional or pharmacist for more information. Do not breast-feed an infant while taking this medicine or for 6 months after stopping it. This medicine has caused ovarian failure in some women. This medicine may interfere with the ability to have a child. Talk with your doctor or health care professional if you are concerned about your fertility. What side effects may I notice from receiving this medication? Side effects that you should report to your doctor or health care professional as soon as possible: allergic reactions like skin rash, itching or hives, swelling of the face, lips, or tongue increased blood pressure severe stomach pain signs and symptoms of hgh blood sugar such as being more thirsty or hungry or having to urinate more than normal. You may also feel very tired or have blurry vision. signs and symptoms of low blood sugar such as feeling anxious; confusion; dizziness; increased hunger; unusually weak or tired; sweating; shakiness; cold; irritable; headache; blurred vision; fast   heartbeat; loss of consciousness unusually slow heartbeat Side effects that usually do not require medical attention (report to your doctor or health care  professional if they continue or are bothersome): constipation diarrhea dizziness headache muscle pain muscle spasms nausea pain, redness, or irritation at site where injected This list may not describe all possible side effects. Call your doctor for medical advice about side effects. You may report side effects to FDA at 1-800-FDA-1088. Where should I keep my medication? This drug is given in a hospital or clinic and will not be stored at home. NOTE: This sheet is a summary. It may not cover all possible information. If you have questions about this medicine, talk to your doctor, pharmacist, or health care provider.  2023 Elsevier/Gold Standard (2017-11-16 00:00:00)  

## 2021-06-15 ENCOUNTER — Encounter: Payer: Self-pay | Admitting: Oncology

## 2021-06-15 ENCOUNTER — Ambulatory Visit
Admission: RE | Admit: 2021-06-15 | Discharge: 2021-06-15 | Disposition: A | Discharge status: Home / Self Care | Payer: No Typology Code available for payment source | Source: Ambulatory Visit | Attending: Family Medicine | Admitting: Family Medicine

## 2021-06-15 DIAGNOSIS — Z1231 Encounter for screening mammogram for malignant neoplasm of breast: Secondary | ICD-10-CM

## 2021-07-07 ENCOUNTER — Inpatient Hospital Stay: Payer: No Typology Code available for payment source | Attending: Nurse Practitioner

## 2021-07-07 VITALS — BP 117/78 | HR 60 | Temp 97.9°F | Resp 18 | Wt 175.4 lb

## 2021-07-07 DIAGNOSIS — C7A098 Malignant carcinoid tumors of other sites: Secondary | ICD-10-CM | POA: Diagnosis present

## 2021-07-07 DIAGNOSIS — C7A8 Other malignant neuroendocrine tumors: Secondary | ICD-10-CM

## 2021-07-07 MED ORDER — LANREOTIDE ACETATE 120 MG/0.5ML ~~LOC~~ SOLN
120.0000 mg | Freq: Once | SUBCUTANEOUS | Status: AC
  Administered 2021-07-07: 120 mg via SUBCUTANEOUS
  Filled 2021-07-07: qty 120

## 2021-07-17 ENCOUNTER — Encounter: Payer: Self-pay | Admitting: Oncology

## 2021-07-31 ENCOUNTER — Encounter: Payer: Self-pay | Admitting: *Deleted

## 2021-07-31 NOTE — Progress Notes (Signed)
Received faxed request from [facility] for medical records. This request was emailed to [facility].

## 2021-08-04 ENCOUNTER — Inpatient Hospital Stay: Payer: No Typology Code available for payment source | Attending: Nurse Practitioner

## 2021-08-04 ENCOUNTER — Inpatient Hospital Stay: Payer: No Typology Code available for payment source

## 2021-08-04 ENCOUNTER — Inpatient Hospital Stay (HOSPITAL_BASED_OUTPATIENT_CLINIC_OR_DEPARTMENT_OTHER): Payer: No Typology Code available for payment source | Admitting: Nurse Practitioner

## 2021-08-04 ENCOUNTER — Encounter: Payer: Self-pay | Admitting: Nurse Practitioner

## 2021-08-04 VITALS — BP 116/72 | HR 72 | Temp 98.2°F | Resp 18 | Ht 62.0 in | Wt 173.6 lb

## 2021-08-04 DIAGNOSIS — C7B8 Other secondary neuroendocrine tumors: Secondary | ICD-10-CM

## 2021-08-04 DIAGNOSIS — D0501 Lobular carcinoma in situ of right breast: Secondary | ICD-10-CM | POA: Diagnosis not present

## 2021-08-04 DIAGNOSIS — C7B02 Secondary carcinoid tumors of liver: Secondary | ICD-10-CM | POA: Insufficient documentation

## 2021-08-04 DIAGNOSIS — C7A098 Malignant carcinoid tumors of other sites: Secondary | ICD-10-CM | POA: Insufficient documentation

## 2021-08-04 DIAGNOSIS — C7A8 Other malignant neuroendocrine tumors: Secondary | ICD-10-CM

## 2021-08-04 DIAGNOSIS — E7800 Pure hypercholesterolemia, unspecified: Secondary | ICD-10-CM | POA: Diagnosis not present

## 2021-08-04 DIAGNOSIS — Z79899 Other long term (current) drug therapy: Secondary | ICD-10-CM | POA: Diagnosis not present

## 2021-08-04 MED ORDER — LANREOTIDE ACETATE 120 MG/0.5ML ~~LOC~~ SOLN
120.0000 mg | Freq: Once | SUBCUTANEOUS | Status: AC
  Administered 2021-08-04: 120 mg via SUBCUTANEOUS
  Filled 2021-08-04: qty 120

## 2021-08-04 NOTE — Patient Instructions (Signed)
Lanreotide injection What is this medication? LANREOTIDE (lan REE oh tide) is used to reduce blood levels of growth hormone in patients with a condition called acromegaly. It also works to slow or stop tumor growth in patients with neuroendocrine tumors and treat carcinoid syndrome. This medicine may be used for other purposes; ask your health care provider or pharmacist if you have questions. COMMON BRAND NAME(S): Somatuline Depot What should I tell my care team before I take this medication? They need to know if you have any of these conditions: diabetes gallbladder disease heart disease kidney disease liver disease thyroid disease an unusual or allergic reaction to lanreotide, other medicines, foods, dyes, or preservatives pregnant or trying to get pregnant breast-feeding How should I use this medication? This medicine is for injection under the skin. It is given by a health care professional in a hospital or clinic setting. Contact your pediatrician or health care professional regarding the use of this medicine in children. Special care may be needed. Overdosage: If you think you have taken too much of this medicine contact a poison control center or emergency room at once. NOTE: This medicine is only for you. Do not share this medicine with others. What if I miss a dose? It is important not to miss your dose. Call your doctor or health care professional if you are unable to keep an appointment. What may interact with this medication? This medicine may interact with the following medications: bromocriptine cyclosporine certain medicines for blood pressure, heart disease, irregular heart beat certain medicines for diabetes quinidine terfenadine This list may not describe all possible interactions. Give your health care provider a list of all the medicines, herbs, non-prescription drugs, or dietary supplements you use. Also tell them if you smoke, drink alcohol, or use illegal drugs.  Some items may interact with your medicine. What should I watch for while using this medication? Tell your doctor or healthcare professional if your symptoms do not start to get better or if they get worse. Visit your doctor or health care professional for regular checks on your progress. Your condition will be monitored carefully while you are receiving this medicine. This medicine may increase blood sugar. Ask your healthcare provider if changes in diet or medicines are needed if you have diabetes. You may need blood work done while you are taking this medicine. Women should inform their doctor if they wish to become pregnant or think they might be pregnant. There is a potential for serious side effects to an unborn child. Talk to your health care professional or pharmacist for more information. Do not breast-feed an infant while taking this medicine or for 6 months after stopping it. This medicine has caused ovarian failure in some women. This medicine may interfere with the ability to have a child. Talk with your doctor or health care professional if you are concerned about your fertility. What side effects may I notice from receiving this medication? Side effects that you should report to your doctor or health care professional as soon as possible: allergic reactions like skin rash, itching or hives, swelling of the face, lips, or tongue increased blood pressure severe stomach pain signs and symptoms of hgh blood sugar such as being more thirsty or hungry or having to urinate more than normal. You may also feel very tired or have blurry vision. signs and symptoms of low blood sugar such as feeling anxious; confusion; dizziness; increased hunger; unusually weak or tired; sweating; shakiness; cold; irritable; headache; blurred vision; fast   heartbeat; loss of consciousness unusually slow heartbeat Side effects that usually do not require medical attention (report to your doctor or health care  professional if they continue or are bothersome): constipation diarrhea dizziness headache muscle pain muscle spasms nausea pain, redness, or irritation at site where injected This list may not describe all possible side effects. Call your doctor for medical advice about side effects. You may report side effects to FDA at 1-800-FDA-1088. Where should I keep my medication? This drug is given in a hospital or clinic and will not be stored at home. NOTE: This sheet is a summary. It may not cover all possible information. If you have questions about this medicine, talk to your doctor, pharmacist, or health care provider.  2023 Elsevier/Gold Standard (2017-11-16 00:00:00)  

## 2021-08-04 NOTE — Progress Notes (Signed)
[place] OFFICE PROGRESS NOTE   Diagnosis: Carcinoid tumor  INTERVAL HISTORY:   Sally Brown returns as scheduled.  She continues monthly lanreotide.  No flushing or diarrhea.  She continues tamoxifen.  No hot flashes.  She denies abdominal pain.  She has a good appetite.  Objective:  Vital signs in last 24 hours:  Blood pressure 116/72, pulse 72, temperature 98.2 F (36.8 C), temperature source Oral, resp. rate 18, height '5\' 2"'$  (1.575 m), weight 173 lb 9.6 oz (78.7 kg), last menstrual period 05/17/2017, SpO2 96 %.    HEENT: No thrush or ulcers. Lymphatics: No palpable cervical, supraclavicular or axillary lymph nodes. Resp: Lungs clear bilaterally. Cardio: Regular rate and rhythm. GI: Abdomen soft and nontender.  No hepatosplenomegaly. Vascular: No leg edema.    Lab Results:  Lab Results  Component Value Date   WBC 6.0 04/14/2021   HGB 12.7 04/14/2021   HCT 38.0 04/14/2021   MCV 91.8 04/14/2021   PLT 237 04/14/2021   NEUTROABS 3.6 04/14/2021    Imaging:  No results found.  Medications: I have reviewed the patient's current medications.  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   06/07/2018 MRI of the liver-numerous hepatic lesions scattered throughout all aspects of the liver.  06/14/2018 PET scan-enlarged and hypermetabolic perihepatic and portacaval lymph nodes; diffuse heterogeneous uptake within the liver with subtle areas of mildly increased FDG uptake corresponding to suspicious lesions identified on MRI.   06/21/2018 biopsy of a right liver lobe mass-low-grade neuroendocrine tumor consistent with metastasis, positive CD56, chromogranin, synaptophysin, CDX-2 and cytokeratin AE1/AE3. 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  Single focus of uptake in the proximal right humerus.  No clear primary tumor identified. Normal chromogranin A 07/05/2018 and normal 24-hour urine 5 HIAA 08/03/2018 Monthly lanreotide beginning 08/03/2018 CT 12/15/2018-small hepatic metastases, mildly improved, slight increase in size of a porta hepatis node CT abdomen/pelvis 07/02/2019-some of the liver lesions are slightly larger, others stable or smaller, slight increase in splenic lesions, minimal increase in abdominal adenopathy Cycle 1 Lutathera 08/14/2019 Cycle 2 Lutathera 10/08/2019 Cycle 3 Lutathera 12/03/2019 Cycle 4 Lutathera 01/29/2020 Netspot 03/11/2020-marked reduction in activity within hepatic metastases with only 1 remaining measurable lesion, decrease in size and radiotracer activity involving periportal and peripancreatic lymph nodes, decreased radiotracer activity at a subcarinal node and resolution of activity at a left supraclavicular node, resolution of focal metabolic activity in the right humerus, near complete resolution of activity in the left T3 transverse process, decreased activity involving a right acetabular lesion, no evidence of new or progressive disease Monthly lanreotide continued Netspot 12/22/2020 (outside facility)-subjective stability of disease-very small left hepatic lobe and caudate metastatic lesions.  Additional hepatic metastatic lesions not completely excluded.  Metastatic hepatic hilar/portacaval node.  Low suspicion subcarinal lymph node. EUS 01/08/2021-suspicion of Barrett's esophagus 35 cm from the incisors,, no gross lesion in the duodenum or visualized jejunum, no significant pathology in the pancreas, common bile duct, no malignant appearing lymph nodes in the celiac, peripancreatic, porta hepatis region 3.   lobular carcinoma in situ right breast 05/29/2018, on tamoxifen, followed by Sally Brown Bilateral breast MRI 11/11/2020-no MRI evidence for malignancy in either  breast. Bilateral mammogram 06/15/2021-no evidence of malignancy. 4.   Hypercholesterolemia 5.   Nausea and vomiting following cycle 1 Lutathera    Disposition: Sally Brown appears stable.  She will continue monthly lanreotide.  We will follow-up on the chromogranin A level from today.  Plan is for a restaging Netspot in December.  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  She is followed by Sally Brown.  She will return for follow-up in 3 months.  We are available to see her sooner if needed.   Ned Card ANP/GNP-BC   08/04/2021  8:17 AM

## 2021-08-05 LAB — CHROMOGRANIN A: Chromogranin A (ng/mL): 47.4 ng/mL (ref 0.0–101.8)

## 2021-08-18 ENCOUNTER — Encounter: Payer: Self-pay | Admitting: Oncology

## 2021-08-21 ENCOUNTER — Telehealth: Payer: Self-pay

## 2021-08-21 NOTE — Telephone Encounter (Signed)
Called the patient about her Lanreotide prescription and she requested me to mailed the prescription out. Place the prescription in the out box.

## 2021-08-21 NOTE — Telephone Encounter (Signed)
TC to Pt following up on my chart message stating she wanted a prescription for lanreotide because she found a home infusion center that would administer the medication for her. Pt states the cost of her out of pocket expense for getting the medication is the reason why she wants to try the home infusion center. Discussed with Dr Benay Spice who stated this is ok.prescription mailed out to Pt today.Pt will keep current injection appointments until she can get everything set up.

## 2021-09-01 ENCOUNTER — Inpatient Hospital Stay: Payer: No Typology Code available for payment source | Attending: Nurse Practitioner

## 2021-09-01 VITALS — BP 113/75 | HR 57 | Temp 97.8°F | Resp 18

## 2021-09-01 DIAGNOSIS — C7B02 Secondary carcinoid tumors of liver: Secondary | ICD-10-CM | POA: Insufficient documentation

## 2021-09-01 DIAGNOSIS — C7B01 Secondary carcinoid tumors of distant lymph nodes: Secondary | ICD-10-CM | POA: Insufficient documentation

## 2021-09-01 DIAGNOSIS — C7A098 Malignant carcinoid tumors of other sites: Secondary | ICD-10-CM | POA: Diagnosis not present

## 2021-09-01 DIAGNOSIS — C7B8 Other secondary neuroendocrine tumors: Secondary | ICD-10-CM

## 2021-09-01 MED ORDER — LANREOTIDE ACETATE 120 MG/0.5ML ~~LOC~~ SOLN
120.0000 mg | Freq: Once | SUBCUTANEOUS | Status: AC
  Administered 2021-09-01: 120 mg via SUBCUTANEOUS
  Filled 2021-09-01: qty 120

## 2021-09-01 NOTE — Patient Instructions (Signed)
Lanreotide injection What is this medication? LANREOTIDE (lan REE oh tide) is used to reduce blood levels of growth hormone in patients with a condition called acromegaly. It also works to slow or stop tumor growth in patients with neuroendocrine tumors and treat carcinoid syndrome. This medicine may be used for other purposes; ask your health care provider or pharmacist if you have questions. COMMON BRAND NAME(S): Somatuline Depot What should I tell my care team before I take this medication? They need to know if you have any of these conditions: diabetes gallbladder disease heart disease kidney disease liver disease thyroid disease an unusual or allergic reaction to lanreotide, other medicines, foods, dyes, or preservatives pregnant or trying to get pregnant breast-feeding How should I use this medication? This medicine is for injection under the skin. It is given by a health care professional in a hospital or clinic setting. Contact your pediatrician or health care professional regarding the use of this medicine in children. Special care may be needed. Overdosage: If you think you have taken too much of this medicine contact a poison control center or emergency room at once. NOTE: This medicine is only for you. Do not share this medicine with others. What if I miss a dose? It is important not to miss your dose. Call your doctor or health care professional if you are unable to keep an appointment. What may interact with this medication? This medicine may interact with the following medications: bromocriptine cyclosporine certain medicines for blood pressure, heart disease, irregular heart beat certain medicines for diabetes quinidine terfenadine This list may not describe all possible interactions. Give your health care provider a list of all the medicines, herbs, non-prescription drugs, or dietary supplements you use. Also tell them if you smoke, drink alcohol, or use illegal drugs.  Some items may interact with your medicine. What should I watch for while using this medication? Tell your doctor or healthcare professional if your symptoms do not start to get better or if they get worse. Visit your doctor or health care professional for regular checks on your progress. Your condition will be monitored carefully while you are receiving this medicine. This medicine may increase blood sugar. Ask your healthcare provider if changes in diet or medicines are needed if you have diabetes. You may need blood work done while you are taking this medicine. Women should inform their doctor if they wish to become pregnant or think they might be pregnant. There is a potential for serious side effects to an unborn child. Talk to your health care professional or pharmacist for more information. Do not breast-feed an infant while taking this medicine or for 6 months after stopping it. This medicine has caused ovarian failure in some women. This medicine may interfere with the ability to have a child. Talk with your doctor or health care professional if you are concerned about your fertility. What side effects may I notice from receiving this medication? Side effects that you should report to your doctor or health care professional as soon as possible: allergic reactions like skin rash, itching or hives, swelling of the face, lips, or tongue increased blood pressure severe stomach pain signs and symptoms of hgh blood sugar such as being more thirsty or hungry or having to urinate more than normal. You may also feel very tired or have blurry vision. signs and symptoms of low blood sugar such as feeling anxious; confusion; dizziness; increased hunger; unusually weak or tired; sweating; shakiness; cold; irritable; headache; blurred vision; fast   heartbeat; loss of consciousness unusually slow heartbeat Side effects that usually do not require medical attention (report to your doctor or health care  professional if they continue or are bothersome): constipation diarrhea dizziness headache muscle pain muscle spasms nausea pain, redness, or irritation at site where injected This list may not describe all possible side effects. Call your doctor for medical advice about side effects. You may report side effects to FDA at 1-800-FDA-1088. Where should I keep my medication? This drug is given in a hospital or clinic and will not be stored at home. NOTE: This sheet is a summary. It may not cover all possible information. If you have questions about this medicine, talk to your doctor, pharmacist, or health care provider.  2023 Elsevier/Gold Standard (2017-11-16 00:00:00)  

## 2021-09-29 ENCOUNTER — Inpatient Hospital Stay: Payer: No Typology Code available for payment source | Attending: Nurse Practitioner

## 2021-09-29 VITALS — BP 108/71 | HR 61 | Temp 98.5°F | Resp 18

## 2021-09-29 DIAGNOSIS — C7A098 Malignant carcinoid tumors of other sites: Secondary | ICD-10-CM | POA: Diagnosis present

## 2021-09-29 DIAGNOSIS — C7B02 Secondary carcinoid tumors of liver: Secondary | ICD-10-CM | POA: Diagnosis present

## 2021-09-29 DIAGNOSIS — C7B8 Other secondary neuroendocrine tumors: Secondary | ICD-10-CM

## 2021-09-29 MED ORDER — LANREOTIDE ACETATE 120 MG/0.5ML ~~LOC~~ SOLN
120.0000 mg | Freq: Once | SUBCUTANEOUS | Status: AC
  Administered 2021-09-29: 120 mg via SUBCUTANEOUS
  Filled 2021-09-29: qty 120

## 2021-09-29 NOTE — Patient Instructions (Signed)
Lanreotide Injection What is this medication? LANREOTIDE (lan REE oh tide) treats high levels of growth hormone (acromegaly). It is used when other therapies have not worked well enough or cannot be tolerated. It works by reducing the amount of growth hormone your body makes. This reduces symptoms and the risk of health problems caused by too much growth hormone, such as diabetes and heart disease. It may also be used to treat neuroendocrine tumors, a cancer of the cells that release hormones and other substances in your body. It works by slowing down the release of these substances from the cells. This slows tumor growth. It also decreases the symptoms of carcinoid syndrome, such as flushing or diarrhea. This medicine may be used for other purposes; ask your health care provider or pharmacist if you have questions. COMMON BRAND NAME(S): Somatuline [place] What should I tell my care team before I take this medication? They need to know if you have any of these conditions: Diabetes Gallbladder disease Heart disease Kidney disease Liver disease Thyroid disease An unusual or allergic reaction to lanreotide, other medications, foods, dyes, or preservatives Pregnant or trying to get pregnant Breast-feeding How should I use this medication? This medication is injected under the skin. It is given by your care team in a hospital or clinic setting. Talk to your care team about the use of this medication in children. Special care may be needed. Overdosage: If you think you have taken too much of this medicine contact a poison control center or emergency room at once. NOTE: This medicine is only for you. Do not share this medicine with others. What if I miss a dose? Keep appointments for follow-up doses. It is important not to miss your dose. Call your care team if you are unable to keep an appointment. What may interact with this medication? Bromocriptine Cyclosporine Certain medications for blood  pressure, heart disease, irregular heartbeat Certain medications for diabetes Quinidine Terfenadine This list may not describe all possible interactions. Give your health care provider a list of all the medicines, herbs, non-prescription drugs, or dietary supplements you use. Also tell them if you smoke, drink alcohol, or use illegal drugs. Some items may interact with your medicine. What should I watch for while using this medication? Visit your care team for regular checks on your progress. Tell your care team if your symptoms do not start to get better or if they get worse. Your condition will be monitored carefully while you are receiving this medication. You may need blood work while you are taking this medication. This medication may increase blood sugar. The risk may be higher in patients who already have diabetes. Ask your care team what you can do to lower your risk of diabetes while taking this medication. Talk to your care team if you wish to become pregnant or think you may be pregnant. This medication can cause serious birth defects. Do not breast-feed while taking this medication and for 6 months after stopping therapy. This medication may cause infertility. Talk to your care team if you are concerned about your fertility. What side effects may I notice from receiving this medication? Side effects that you should report to your care team as soon as possible: Allergic reactions--skin rash, itching, hives, swelling of the face, lips, tongue, or throat Gallbladder problems--severe stomach pain, nausea, vomiting, fever High blood sugar (hyperglycemia)--increased thirst or amount of urine, unusual weakness or fatigue, blurry vision Increase in blood pressure Low blood sugar (hypoglycemia)--tremors or shaking, anxiety, sweating, cold   or clammy skin, confusion, dizziness, rapid heartbeat Low thyroid levels (hypothyroidism)--unusual weakness or fatigue, increased sensitivity to cold,  constipation, hair loss, dry skin, weight gain, feelings of depression Slow heartbeat--dizziness, feeling faint or lightheaded, confusion, trouble breathing, unusual weakness or fatigue Side effects that usually do not require medical attention (report to your care team if they continue or are bothersome): Diarrhea Dizziness Headache Muscle spasms Nausea Pain, redness, irritation, or bruising at the injection site Stomach pain This list may not describe all possible side effects. Call your doctor for medical advice about side effects. You may report side effects to FDA at 1-800-FDA-1088. Where should I keep my medication? This medication is given in a hospital or clinic. It will not be stored at home. NOTE: This sheet is a summary. It may not cover all possible information. If you have questions about this medicine, talk to your doctor, pharmacist, or health care provider.  2023 Elsevier/Gold Standard (2021-03-18 00:00:00)  

## 2021-09-30 IMAGING — MR MR BREAST BILAT WO/W CM
8 of 12 series · 31 of 48 positions shown · IV contrast (8ml gadavist)
Comparison: Previous exam(s).

CLINICAL DATA: History of right breast LCIS diagnosed in May 2018
in the upper outer right breast. Patient is on tamoxifen and
undergoing surveillance. Patient also had a benign MRI guided biopsy
demonstrating FCC and PASH in the lower outer right breast. History
of recently diagnosed metastatic neuroendocrine tumor.

LABS:  None
EXAM:
BILATERAL BREAST MRI WITH AND WITHOUT CONTRAST
TECHNIQUE: Multiplanar, multisequence MR images of both breasts were obtained
prior to and following the intravenous administration of 8 ml of
Gadavist

[Series 2: t2_tirm_tra ipat (a-p) · axial · 3.0mm · 0.66mm/px · 1 of 55 slices shown]
[im 1/55]
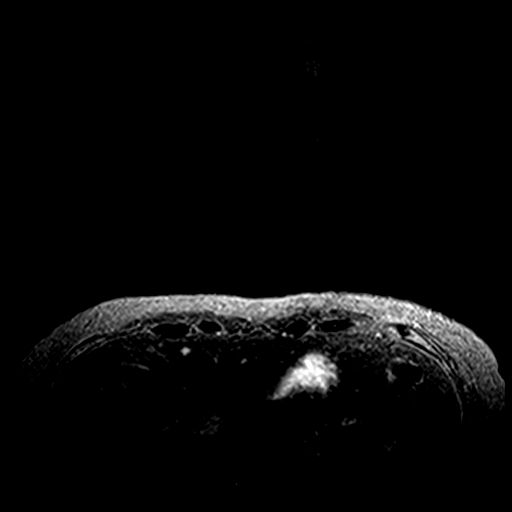

[Series 3: fl3d pre-cm no · axial · non-contrast · 1.2mm · 0.89mm/px · z∈[-74,+98]mm · 5 of 144 slices shown]
[im 1/144]
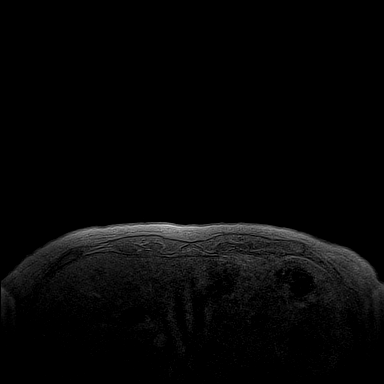
[im 36/144]
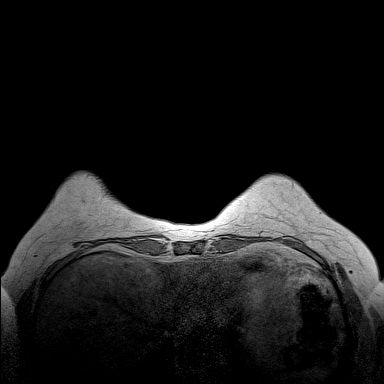
[im 72/144]
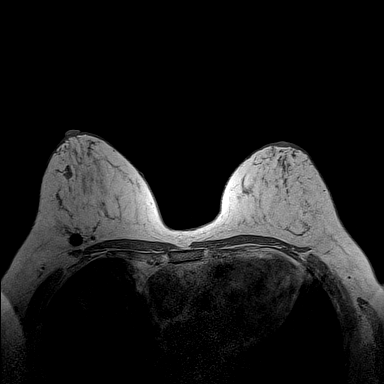
[im 108/144]
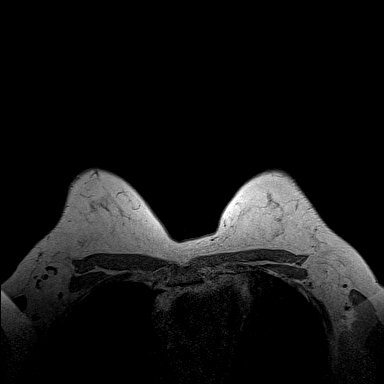
[im 144/144]
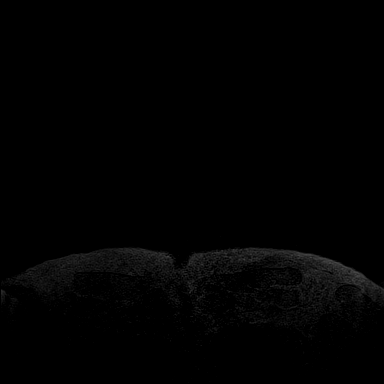

[Series 4: fl3d pre-cm · axial · non-contrast · 1.2mm · 0.89mm/px · z∈[-74,+98]mm · 5 of 144 slices shown]
[im 1/144]
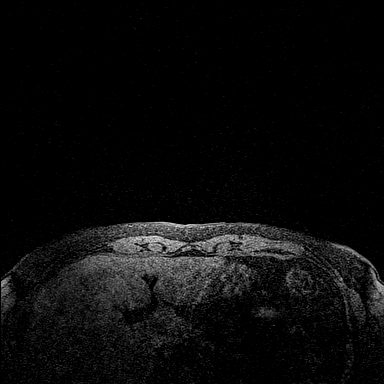
[im 36/144]
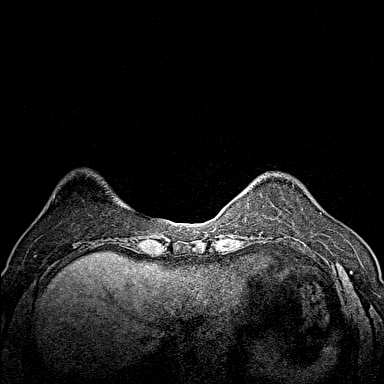
[im 72/144]
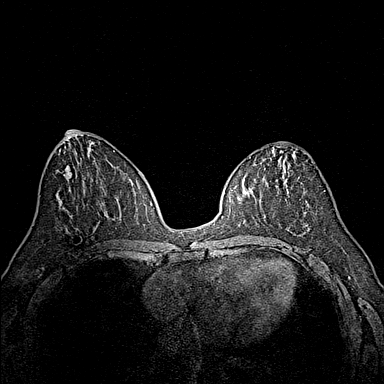
[im 108/144]
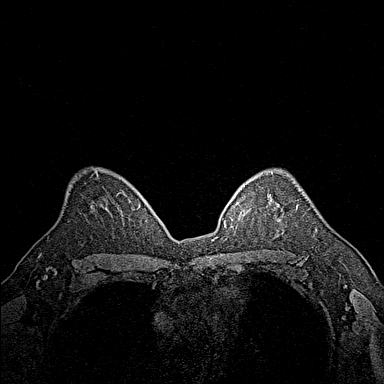
[im 144/144]
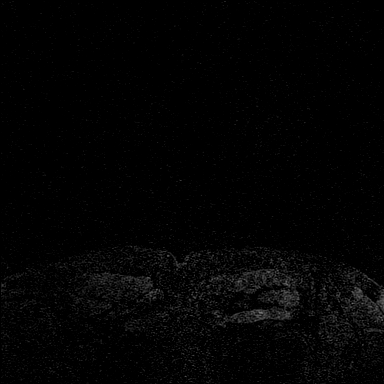

[Series 5: fl3d post-cm 20 · axial · 1.2mm · 0.89mm/px · z∈[-74,+98]mm · 5 of 144 slices shown (1 of 3)]
[im 1/144]
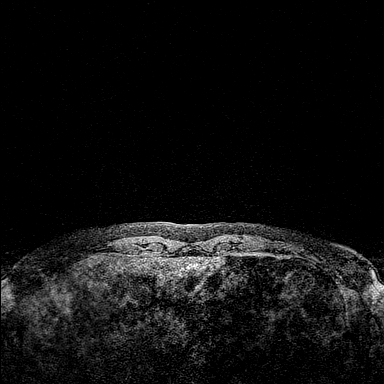
[im 36/144]
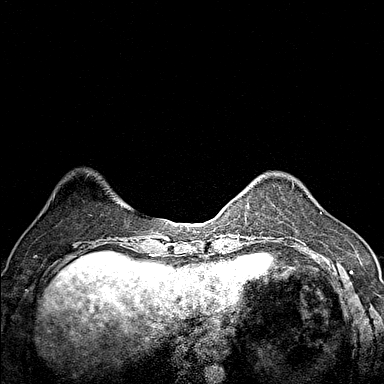
[im 72/144]
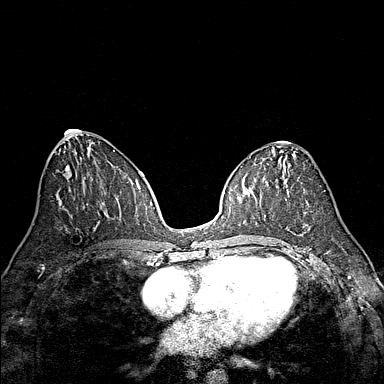
[im 108/144]
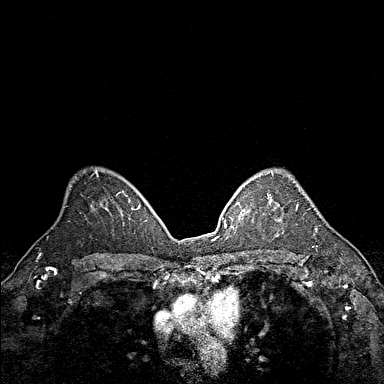
[im 144/144]
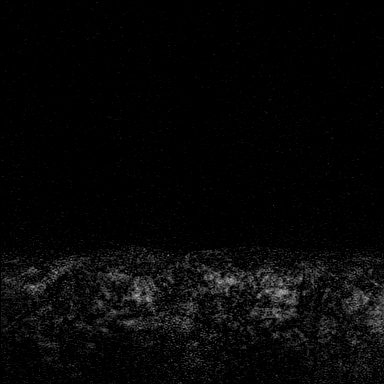

[Series 6: fl3d post-cm 20 · axial · 1.2mm · 0.89mm/px · z∈[-74,+98]mm · 5 of 144 slices shown (2 of 3)]
[im 1/144]
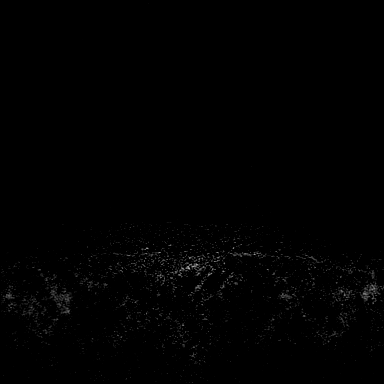
[im 36/144]
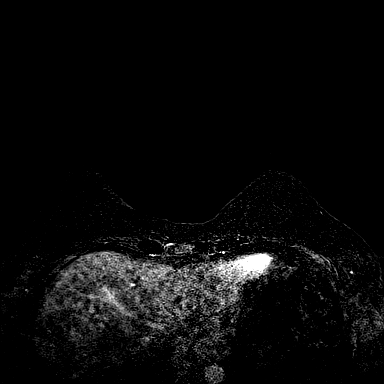
[im 72/144]
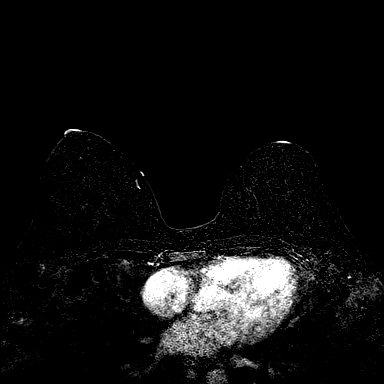
[im 108/144]
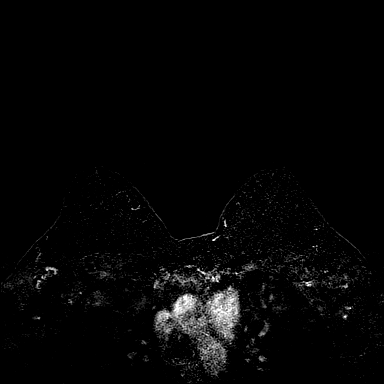
[im 144/144]
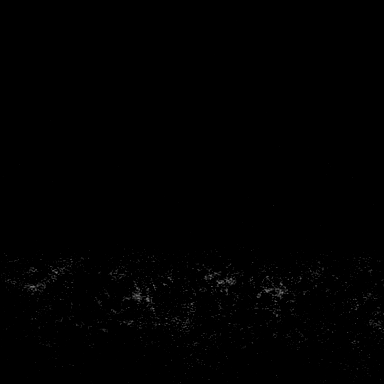

[Series 7: fl3d post-cm 20 · axial · 172.8mm · 0.89mm/px · 1 of 1 slices shown (3 of 3)]
[im 1/1]
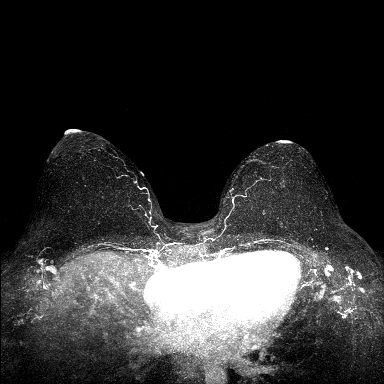

[Series 8: fl3d post-cm 3min · axial · 1.2mm · 0.89mm/px · z∈[-74,+98]mm · 6 of 144 slices shown]
[im 1/144]
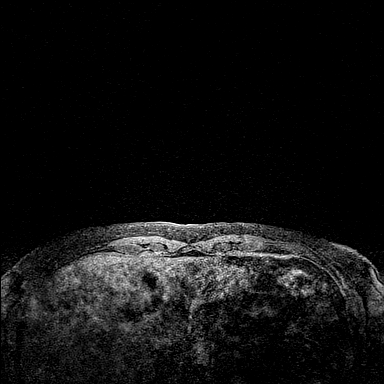
[im 29/144]
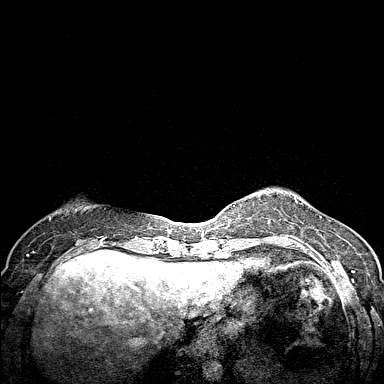
[im 58/144]
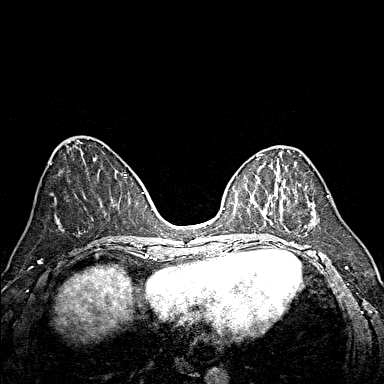
[im 86/144]
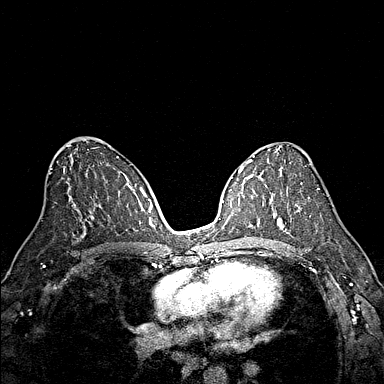
[im 115/144]
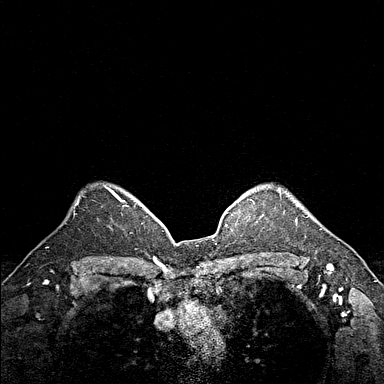
[im 144/144]
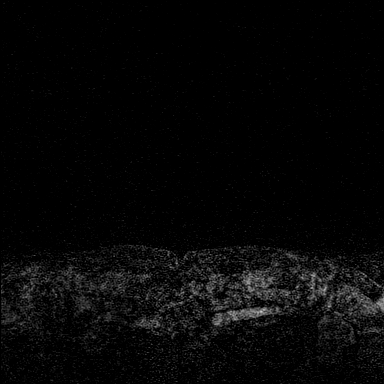

[Series 9: fl3d post-cm 3min_sub · axial · 1.2mm · 0.89mm/px · z∈[-74,-5]mm · 3 of 144 slices shown]
[im 1/144]
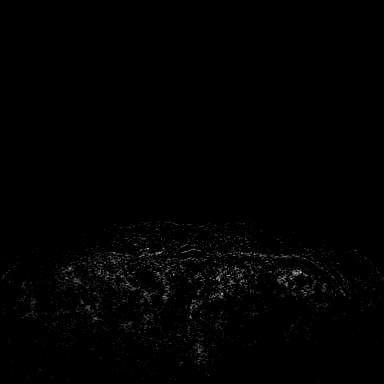
[im 29/144]
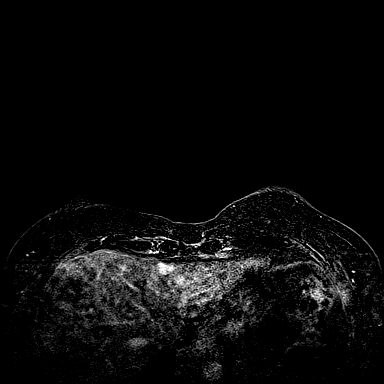
[im 58/144]
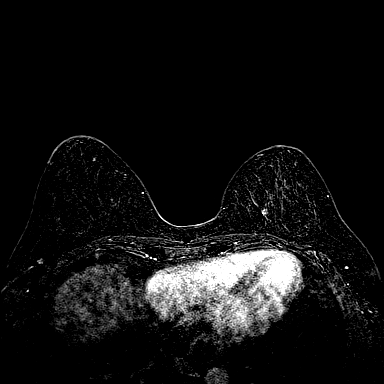

[31 of 48 positions shown; findings below may reference images not displayed]

Three-dimensional MR images were rendered by post-processing of the
original MR data on an independent workstation. The
three-dimensional MR images were interpreted, and findings are
reported in the following complete MRI report for this study. Three
dimensional images were evaluated at the independent [employer]
workstation
FINDINGS: Breast composition: b. Scattered fibroglandular tissue.

Background parenchymal enhancement: Minimal

Right breast: There is two sites of susceptibility artifact likely
denoting biopsy marking clips in the upper outer right breast
posterior depth at the site of the previously biopsied LCIS. There
is a small amount of linear enhancement at the biopsy site measuring
1.0 cm (series 9, image 48), significantly decreased from prior when
measured up to 2.0 cm. Additionally in the inferior central breast
there is a site of susceptibility artifact likely representing
biopsy clip from prior benign biopsy. There is mild asymmetric
enhancement adjacent to the biopsy clip measuring 3.9 x 2.4 cm
(series 9, image 97) on the 3 minutes subtracted image, which is
decreased from the prior study when the enhancement measured
approximately 4.7 x 4.7 cm. No new mass or area of non mass
enhancement identified in the right breast.

Left breast: No new enhancing mass or abnormal area of enhancement.

Lymph nodes: No abnormal appearing lymph nodes.

Ancillary findings: Innumerable T2 bright lesions throughout the
liver, are similar in appearance to the prior MRI, and consistent
with patient's known metastatic neuroendocrine tumor.
IMPRESSION: 1. Small amount of enhancement measuring up to 1.0 cm at the site of
biopsy proven LCIS, significantly decreased from prior.

2. Decreased subtle asymmetric enhancement in the lower outer right
breast at the site of prior benign biopsy.

3.  No MRI evidence of malignancy in the bilateral breasts.

4. Redemonstrated multiple T2 bright masses throughout the liver
compatible with patient's known neuroendocrine disease, similar in
appearance to the prior study.

RECOMMENDATION:
1. Screening mammogram per normal schedule.(Code:[id number])
2. Surveillance MRI at the discretion of patient's surgeon given
biopsy-proven LCIS in the right breast and the patient's elevated
life time risk.

BI-RADS CATEGORY  2: Benign.

## 2021-10-27 ENCOUNTER — Inpatient Hospital Stay (HOSPITAL_BASED_OUTPATIENT_CLINIC_OR_DEPARTMENT_OTHER): Payer: No Typology Code available for payment source | Admitting: Oncology

## 2021-10-27 ENCOUNTER — Inpatient Hospital Stay: Payer: No Typology Code available for payment source

## 2021-10-27 ENCOUNTER — Inpatient Hospital Stay: Payer: No Typology Code available for payment source | Attending: Nurse Practitioner

## 2021-10-27 VITALS — BP 110/80 | HR 60 | Temp 98.1°F | Resp 18 | Ht 62.0 in | Wt 173.2 lb

## 2021-10-27 DIAGNOSIS — Z79899 Other long term (current) drug therapy: Secondary | ICD-10-CM | POA: Diagnosis not present

## 2021-10-27 DIAGNOSIS — R112 Nausea with vomiting, unspecified: Secondary | ICD-10-CM | POA: Diagnosis not present

## 2021-10-27 DIAGNOSIS — E7800 Pure hypercholesterolemia, unspecified: Secondary | ICD-10-CM | POA: Diagnosis not present

## 2021-10-27 DIAGNOSIS — C7B8 Other secondary neuroendocrine tumors: Secondary | ICD-10-CM

## 2021-10-27 DIAGNOSIS — C7B02 Secondary carcinoid tumors of liver: Secondary | ICD-10-CM | POA: Diagnosis not present

## 2021-10-27 DIAGNOSIS — C7A8 Other malignant neuroendocrine tumors: Secondary | ICD-10-CM | POA: Diagnosis not present

## 2021-10-27 DIAGNOSIS — C7A098 Malignant carcinoid tumors of other sites: Secondary | ICD-10-CM | POA: Diagnosis present

## 2021-10-27 DIAGNOSIS — D0501 Lobular carcinoma in situ of right breast: Secondary | ICD-10-CM

## 2021-10-27 MED ORDER — LANREOTIDE ACETATE 120 MG/0.5ML ~~LOC~~ SOLN
120.0000 mg | Freq: Once | SUBCUTANEOUS | Status: AC
  Administered 2021-10-27: 120 mg via SUBCUTANEOUS
  Filled 2021-10-27: qty 120

## 2021-10-27 NOTE — Progress Notes (Signed)
[place] OFFICE PROGRESS NOTE   Diagnosis: Carcinoid tumor  INTERVAL HISTORY:   Sally Brown returns as scheduled.  She continues monthly lanreotide.  No flushing or diarrhea.  She continues tamoxifen.  No hot flashes.  No complaint.  No change over either breast.  Objective:  Vital signs in last 24 hours:  Blood pressure 110/80, pulse 60, temperature 98.1 F (36.7 C), temperature source Oral, resp. rate 18, height '5\' 2"'$  (1.575 m), weight 173 lb 3.2 oz (78.6 kg), last menstrual period 05/17/2017, SpO2 99 %.    HEENT: Neck without mass Lymphatics: No cervical, supraclavicular, axillary, or inguinal nodes Resp: Lungs clear bilaterally Cardio: Regular rate and rhythm GI: No hepatosplenomegaly, no mass, nontender Vascular: No leg edema   Lab Results:  Lab Results  Component Value Date   WBC 6.0 04/14/2021   HGB 12.7 04/14/2021   HCT 38.0 04/14/2021   MCV 91.8 04/14/2021   PLT 237 04/14/2021   NEUTROABS 3.6 04/14/2021    CMP  Lab Results  Component Value Date   NA 140 04/14/2021   K 4.7 04/14/2021   CL 103 04/14/2021   CO2 27 04/14/2021   GLUCOSE 139 (H) 04/14/2021   BUN 19 04/14/2021   CREATININE 0.81 04/14/2021   CALCIUM 10.2 04/14/2021   PROT 7.5 04/14/2021   ALBUMIN 4.4 04/14/2021   AST 21 04/14/2021   ALT 24 04/14/2021   ALKPHOS 67 04/14/2021   BILITOT 0.5 04/14/2021   GFRNONAA >60 04/14/2021   GFRAA >60 10/08/2019     Medications: I have reviewed the patient's current medications.   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   06/07/2018 MRI of the liver-numerous hepatic lesions scattered throughout all aspects of the liver.  06/14/2018 PET scan-enlarged and hypermetabolic perihepatic and portacaval lymph nodes; diffuse heterogeneous uptake within the liver with subtle areas of mildly increased FDG uptake corresponding to suspicious lesions identified on MRI.    06/21/2018 biopsy of a right liver lobe mass-low-grade neuroendocrine tumor consistent with metastasis, positive CD56, chromogranin, synaptophysin, CDX-2 and cytokeratin AE1/AE3. 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  Single focus of uptake in the proximal right humerus.  No clear primary tumor identified. Normal chromogranin A 07/05/2018 and normal 24-hour urine 5 HIAA 08/03/2018 Monthly lanreotide beginning 08/03/2018 CT 12/15/2018-small hepatic metastases, mildly improved, slight increase in size of a porta hepatis node CT abdomen/pelvis 07/02/2019-some of the liver lesions are slightly larger, others stable or smaller, slight increase in splenic lesions, minimal increase in abdominal adenopathy Cycle 1 Lutathera 08/14/2019 Cycle 2 Lutathera 10/08/2019 Cycle 3 Lutathera 12/03/2019 Cycle 4 Lutathera 01/29/2020 Netspot 03/11/2020-marked reduction in activity within hepatic metastases with only 1 remaining measurable lesion, decrease in size and radiotracer activity involving periportal and peripancreatic lymph nodes, decreased radiotracer activity at a subcarinal node and resolution of activity at a left supraclavicular node, resolution of focal metabolic activity in the right humerus, near complete resolution of activity in the left T3 transverse process, decreased activity involving a right acetabular lesion, no evidence of new or progressive disease Monthly lanreotide continued Netspot 12/22/2020 (outside facility)-subjective stability of disease-very small left hepatic lobe and caudate metastatic lesions.  Additional hepatic metastatic lesions not completely excluded.  Metastatic hepatic hilar/portacaval node.  Low suspicion subcarinal lymph node. EUS 01/08/2021-suspicion of Barrett's esophagus 35 cm from the incisors,, no gross  lesion in the  duodenum or visualized jejunum, no significant pathology in the pancreas, common bile duct, no malignant appearing lymph nodes in the celiac, peripancreatic, porta hepatis region 3.   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 Donne Hazel Bilateral breast MRI 11/11/2020-no MRI evidence for malignancy in either breast. Bilateral mammogram 06/15/2021-no evidence of malignancy. 4.   Hypercholesterolemia 5.   Nausea and vomiting following cycle 1 Lutathera      Disposition: Sally Brown appears stable.  We will follow-up on the chromogranin A level from today.  She continues monthly lanreotide.  She will be scheduled for a restaging Netspot in December.  She continues follow-up with Dr. Donne Hazel for management of breast cancer.  She will be scheduled for a breast MRI in November or December.  She will be scheduled for an office visit after the Netspot in December.  Betsy Coder, MD  10/27/2021  8:04 AM

## 2021-10-28 LAB — CHROMOGRANIN A: Chromogranin A (ng/mL): 53.8 ng/mL (ref 0.0–101.8)

## 2021-11-04 ENCOUNTER — Other Ambulatory Visit: Payer: Self-pay | Admitting: General Surgery

## 2021-11-04 DIAGNOSIS — D0500 Lobular carcinoma in situ of unspecified breast: Secondary | ICD-10-CM

## 2021-11-30 ENCOUNTER — Encounter: Payer: Self-pay | Admitting: Oncology

## 2021-12-01 ENCOUNTER — Inpatient Hospital Stay: Payer: No Typology Code available for payment source | Attending: Nurse Practitioner

## 2021-12-01 ENCOUNTER — Other Ambulatory Visit: Payer: PRIVATE HEALTH INSURANCE

## 2021-12-01 VITALS — BP 113/76 | HR 77 | Temp 98.0°F | Resp 17 | Ht 62.0 in | Wt 172.6 lb

## 2021-12-01 DIAGNOSIS — C7B8 Other secondary neuroendocrine tumors: Secondary | ICD-10-CM

## 2021-12-01 DIAGNOSIS — C7A098 Malignant carcinoid tumors of other sites: Secondary | ICD-10-CM | POA: Diagnosis not present

## 2021-12-01 DIAGNOSIS — Z79899 Other long term (current) drug therapy: Secondary | ICD-10-CM | POA: Diagnosis not present

## 2021-12-01 MED ORDER — LANREOTIDE ACETATE 120 MG/0.5ML ~~LOC~~ SOLN
120.0000 mg | Freq: Once | SUBCUTANEOUS | Status: AC
  Administered 2021-12-01: 120 mg via SUBCUTANEOUS
  Filled 2021-12-01: qty 120

## 2021-12-01 NOTE — Patient Instructions (Signed)
[facility]   Discharge Instructions: Thank you for choosing [facility] to provide your oncology and hematology care.   If you have a lab appointment with the [facility], please go directly to the [facility] and check in at the registration area.   Wear comfortable clothing and clothing appropriate for easy access to any Portacath or PICC line.   We strive to give you quality time with your provider. You may need to reschedule your appointment if you arrive late (15 or more minutes).  Arriving late affects you and other patients whose appointments are after yours.  Also, if you miss three or more appointments without notifying the office, you may be dismissed from the clinic at the provider's discretion.      For prescription refill requests, have your pharmacy contact our office and allow 72 hours for refills to be completed.    Today you received the following chemotherapy and/or immunotherapy agents Lanreotide.      To help prevent nausea and vomiting after your treatment, we encourage you to take your nausea medication as directed.  BELOW ARE SYMPTOMS THAT SHOULD BE REPORTED IMMEDIATELY: *FEVER GREATER THAN 100.4 F (38 C) OR HIGHER *CHILLS OR SWEATING *NAUSEA AND VOMITING THAT IS NOT CONTROLLED WITH YOUR NAUSEA MEDICATION *UNUSUAL SHORTNESS OF BREATH *UNUSUAL BRUISING OR BLEEDING *URINARY PROBLEMS (pain or burning when urinating, or frequent urination) *BOWEL PROBLEMS (unusual diarrhea, constipation, pain near the anus) TENDERNESS IN MOUTH AND THROAT WITH OR WITHOUT PRESENCE OF ULCERS (sore throat, sores in mouth, or a toothache) UNUSUAL RASH, SWELLING OR PAIN  UNUSUAL VAGINAL DISCHARGE OR ITCHING   Items with * indicate a potential emergency and should be followed up as soon as possible or go to the Emergency Department if any problems should occur.  Please show the CHEMOTHERAPY ALERT CARD or IMMUNOTHERAPY ALERT CARD at check-in to  the Emergency Department and triage nurse.  Should you have questions after your visit or need to cancel or reschedule your appointment, please contact [facility]  Dept: [phone]  and follow the prompts.  Office hours are 8:00 a.m. to 4:30 p.m. Monday - Friday. Please note that voicemails left after 4:00 p.m. may not be returned until the following business day.  We are closed weekends and major holidays. You have access to a nurse at all times for urgent questions. Please call the main number to the clinic Dept: [phone] and follow the prompts.   For any non-urgent questions, you may also contact your provider using MyChart. We now offer e-Visits for anyone 9 and older to request care online for non-urgent symptoms. For details visit [email or web address].   Also download the MyChart app! Go to the app store, search "MyChart", open the app, select [facility], and log in with your MyChart username and password.  Masks are optional in the cancer centers. If you would like for your care team to wear a mask while they are taking care of you, please let them know. You may have one support person who is at least 55 years old accompany you for your appointments. Lanreotide Injection What is this medication? LANREOTIDE (lan REE oh tide) treats high levels of growth hormone (acromegaly). It is used when other therapies have not worked well enough or cannot be tolerated. It works by reducing the amount of growth hormone your body makes. This reduces symptoms and the risk of health problems caused by too much growth hormone, such  as diabetes and heart disease. It may also be used to treat neuroendocrine tumors, a cancer of the cells that release hormones and other substances in your body. It works by slowing down the release of these substances from the cells. This slows tumor growth. It also decreases the symptoms of carcinoid syndrome, such as flushing or diarrhea. This  medicine may be used for other purposes; ask your health care provider or pharmacist if you have questions. COMMON BRAND NAME(S): Somatuline Depot What should I tell my care team before I take this medication? They need to know if you have any of these conditions: Diabetes Gallbladder disease Heart disease Kidney disease Liver disease Thyroid disease An unusual or allergic reaction to lanreotide, other medications, foods, dyes, or preservatives Pregnant or trying to get pregnant Breast-feeding How should I use this medication? This medication is injected under the skin. It is given by your care team in a hospital or clinic setting. Talk to your care team about the use of this medication in children. Special care may be needed. Overdosage: If you think you have taken too much of this medicine contact a poison control center or emergency room at once. NOTE: This medicine is only for you. Do not share this medicine with others. What if I miss a dose? Keep appointments for follow-up doses. It is important not to miss your dose. Call your care team if you are unable to keep an appointment. What may interact with this medication? Bromocriptine Cyclosporine Certain medications for blood pressure, heart disease, irregular heartbeat Certain medications for diabetes Quinidine Terfenadine This list may not describe all possible interactions. Give your health care provider a list of all the medicines, herbs, non-prescription drugs, or dietary supplements you use. Also tell them if you smoke, drink alcohol, or use illegal drugs. Some items may interact with your medicine. What should I watch for while using this medication? Visit your care team for regular checks on your progress. Tell your care team if your symptoms do not start to get better or if they get worse. Your condition will be monitored carefully while you are receiving this medication. You may need blood work while you are taking this  medication. This medication may increase blood sugar. The risk may be higher in patients who already have diabetes. Ask your care team what you can do to lower your risk of diabetes while taking this medication. Talk to your care team if you wish to become pregnant or think you may be pregnant. This medication can cause serious birth defects. Do not breast-feed while taking this medication and for 6 months after stopping therapy. This medication may cause infertility. Talk to your care team if you are concerned about your fertility. What side effects may I notice from receiving this medication? Side effects that you should report to your care team as soon as possible: Allergic reactions--skin rash, itching, hives, swelling of the face, lips, tongue, or throat Gallbladder problems--severe stomach pain, nausea, vomiting, fever High blood sugar (hyperglycemia)--increased thirst or amount of urine, unusual weakness or fatigue, blurry vision Increase in blood pressure Low blood sugar (hypoglycemia)--tremors or shaking, anxiety, sweating, cold or clammy skin, confusion, dizziness, rapid heartbeat Low thyroid levels (hypothyroidism)--unusual weakness or fatigue, increased sensitivity to cold, constipation, hair loss, dry skin, weight gain, feelings of depression Slow heartbeat--dizziness, feeling faint or lightheaded, confusion, trouble breathing, unusual weakness or fatigue Side effects that usually do not require medical attention (report to your care team if they continue or are  bothersome): Diarrhea Dizziness Headache Muscle spasms Nausea Pain, redness, irritation, or bruising at the injection site Stomach pain This list may not describe all possible side effects. Call your doctor for medical advice about side effects. You may report side effects to FDA at 1-800-FDA-1088. Where should I keep my medication? This medication is given in a hospital or clinic. It will not be stored at home. NOTE:  This sheet is a summary. It may not cover all possible information. If you have questions about this medicine, talk to your doctor, pharmacist, or health care provider.  2023 Elsevier/Gold Standard (2021-02-27 00:00:00)

## 2021-12-07 ENCOUNTER — Ambulatory Visit
Admission: RE | Admit: 2021-12-07 | Discharge: 2021-12-07 | Disposition: A | Discharge status: Home / Self Care | Payer: No Typology Code available for payment source | Source: Ambulatory Visit | Attending: General Surgery | Admitting: General Surgery

## 2021-12-07 DIAGNOSIS — D0500 Lobular carcinoma in situ of unspecified breast: Secondary | ICD-10-CM

## 2021-12-07 MED ORDER — GADOPICLENOL 0.5 MMOL/ML IV SOLN
8.0000 mL | Freq: Once | INTRAVENOUS | Status: AC | PRN
  Administered 2021-12-07: 8 mL via INTRAVENOUS

## 2021-12-25 ENCOUNTER — Ambulatory Visit
Admission: RE | Admit: 2021-12-25 | Discharge: 2021-12-25 | Disposition: A | Discharge status: Home / Self Care | Payer: No Typology Code available for payment source | Source: Ambulatory Visit | Attending: Gastroenterology | Admitting: Gastroenterology

## 2021-12-25 ENCOUNTER — Other Ambulatory Visit: Payer: Self-pay | Admitting: Gastroenterology

## 2021-12-25 DIAGNOSIS — T184XXS Foreign body in colon, sequela: Secondary | ICD-10-CM

## 2021-12-28 ENCOUNTER — Ambulatory Visit (HOSPITAL_COMMUNITY)
Admission: RE | Admit: 2021-12-28 | Discharge: 2021-12-28 | Disposition: A | Discharge status: Home / Self Care | Payer: No Typology Code available for payment source | Source: Ambulatory Visit | Attending: Oncology | Admitting: Oncology

## 2021-12-28 ENCOUNTER — Other Ambulatory Visit: Payer: Self-pay | Admitting: Oncology

## 2021-12-28 DIAGNOSIS — C7B8 Other secondary neuroendocrine tumors: Secondary | ICD-10-CM | POA: Insufficient documentation

## 2021-12-28 DIAGNOSIS — C7A8 Other malignant neuroendocrine tumors: Secondary | ICD-10-CM | POA: Insufficient documentation

## 2021-12-28 MED ORDER — COPPER CU 64 DOTATATE 1 MCI/ML IV SOLN
4.0000 | Freq: Once | INTRAVENOUS | Status: AC
  Administered 2021-12-28: 3.75 via INTRAVENOUS

## 2021-12-29 ENCOUNTER — Inpatient Hospital Stay: Payer: No Typology Code available for payment source

## 2021-12-29 ENCOUNTER — Inpatient Hospital Stay: Payer: No Typology Code available for payment source | Attending: Nurse Practitioner | Admitting: Oncology

## 2021-12-29 ENCOUNTER — Other Ambulatory Visit: Payer: PRIVATE HEALTH INSURANCE

## 2021-12-29 VITALS — BP 122/73 | HR 73 | Temp 98.1°F | Resp 20 | Ht 62.0 in | Wt 174.2 lb

## 2021-12-29 DIAGNOSIS — Z79899 Other long term (current) drug therapy: Secondary | ICD-10-CM | POA: Diagnosis not present

## 2021-12-29 DIAGNOSIS — C7A8 Other malignant neuroendocrine tumors: Secondary | ICD-10-CM

## 2021-12-29 DIAGNOSIS — C7B8 Other secondary neuroendocrine tumors: Secondary | ICD-10-CM

## 2021-12-29 DIAGNOSIS — C7A098 Malignant carcinoid tumors of other sites: Secondary | ICD-10-CM | POA: Diagnosis present

## 2021-12-29 MED ORDER — LANREOTIDE ACETATE 120 MG/0.5ML ~~LOC~~ SOLN
120.0000 mg | Freq: Once | SUBCUTANEOUS | Status: AC
  Administered 2021-12-29: 120 mg via SUBCUTANEOUS
  Filled 2021-12-29: qty 120

## 2021-12-29 NOTE — Progress Notes (Signed)
[facility] OFFICE PROGRESS NOTE   Diagnosis: Carcinoid tumor  INTERVAL HISTORY:   Sally Brown returns as scheduled.  She feels well.  No diarrhea or flushing.  No complaint.  She continues monthly lanreotide.  She is taking tamoxifen.  She is scheduled to see Dr. Garnette Czech in approximately 1 month.  Objective:  Vital signs in last 24 hours:  Blood pressure 122/73, pulse 73, temperature 98.1 F (36.7 C), temperature source Oral, resp. rate 20, height '5\' 2"'$  (1.575 m), weight 174 lb 3.2 oz (79 kg), last menstrual period 05/17/2017, SpO2 99 %.    Lymphatics: No cervical or supraclavicular nodes Resp: Lungs clear bilaterally Cardio: Regular rate and rhythm no murmur or gallop GI: No hepatosplenomegaly Vascular: No leg edema    Lab Results:  Lab Results  Component Value Date   WBC 6.0 04/14/2021   HGB 12.7 04/14/2021   HCT 38.0 04/14/2021   MCV 91.8 04/14/2021   PLT 237 04/14/2021   NEUTROABS 3.6 04/14/2021    CMP  Lab Results  Component Value Date   NA 140 04/14/2021   K 4.7 04/14/2021   CL 103 04/14/2021   CO2 27 04/14/2021   GLUCOSE 139 (H) 04/14/2021   BUN 19 04/14/2021   CREATININE 0.81 04/14/2021   CALCIUM 10.2 04/14/2021   PROT 7.5 04/14/2021   ALBUMIN 4.4 04/14/2021   AST 21 04/14/2021   ALT 24 04/14/2021   ALKPHOS 67 04/14/2021   BILITOT 0.5 04/14/2021   GFRNONAA >60 04/14/2021   GFRAA >60 10/08/2019    No results found for: "CEA1", "CEA", "ZOX096", "CA125"  Lab Results  Component Value Date   INR 1.1 06/21/2018   LABPROT 14.3 06/21/2018    Imaging:  NM PET DOTATATE SKULL BASE TO MID THIGH  Result Date: 12/28/2021 CLINICAL DATA:  Restaging metastatic neuroendocrine tumor. Prior Lu-177 Dotatate therapy completed 01/29/2020. EXAM: NUCLEAR MEDICINE PET SKULL BASE TO THIGH TECHNIQUE: 3.75 mCi copper 23 DOTATATE was injected intravenously. Full-ring PET imaging was performed from the skull base to thigh after the radiotracer. CT  data was obtained and used for attenuation correction and anatomic localization. COMPARISON:  DOTATATE PET scan 03/11/2020 FINDINGS: NECK No radiotracer activity in neck lymph nodes. Incidental CT findings: None CHEST No radiotracer accumulation within mediastinal or hilar lymph nodes. No suspicious pulmonary nodules on the CT scan. Incidental CT finding:None ABDOMEN/PELVIS small focus radiotracer activity along the anterior margin of the RIGHT hepatic lobe with SUV max equal 14.2 (image 68) compares to SUV max equal 15.6. Similar subcapsular lesion lateral RIGHT hepatic lobe with SUV max equal 12.8 (image 104) compares to SUV max equal 13.1. These small lesions are barely above background liver activity (SUV max equal 10.5). No evidence of new lesions liver. Resolution of previous described periportal radiotracer avid adenopathy. Physiologic activity noted in the liver, spleen, adrenal glands and kidneys. Incidental CT findings:New low-density within the liver consistent hepatic steatosis. SKELETON No focal activity to suggest skeletal metastasis. Incidental CT findings:None IMPRESSION: 1. Two radiotracer avid NET lesions remain in the liver. These lesions are small and minimally above background liver activity. No evidence of progression. 2. No evidence neuroendocrine tumor elsewhere on skull base to thigh DOTATATE PET scan. 3. Specifically no mediastinal radiotracer avid lymph nodes and no periportal lymph radiotracer avid lymph nodes. 4. No evidence skeletal metastasis. 5. New finding of  hepatic steatosis. Electronically Signed   By: Suzy Bouchard M.D.   On: 12/28/2021 13:07   DG Abd 2 Views  Result  Date: 12/27/2021 CLINICAL DATA:  Evaluate endoscopy capsule ingested 1 week ago. EXAM: ABDOMEN - 2 VIEW COMPARISON:  None Available. FINDINGS: No radiopaque endoscopy capsule is identified. Bowel gas pattern is unremarkable. Cholecystectomy clips are present. No acute bony abnormalities are identified.  IMPRESSION: 1. No endoscopy capsule identified. 2. Unremarkable bowel gas pattern. Electronically Signed   By: Margarette Canada M.D.   On: 12/27/2021 10:27    Medications: I have reviewed the patient's current medications.   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   06/07/2018 MRI of the liver-numerous hepatic lesions scattered throughout all aspects of the liver.  06/14/2018 PET scan-enlarged and hypermetabolic perihepatic and portacaval lymph nodes; diffuse heterogeneous uptake within the liver with subtle areas of mildly increased FDG uptake corresponding to suspicious lesions identified on MRI.   06/21/2018 biopsy of a right liver lobe mass-low-grade neuroendocrine tumor consistent with metastasis, positive CD56, chromogranin, synaptophysin, CDX-2 and cytokeratin AE1/AE3. 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  Single focus of uptake in the proximal right humerus.  No clear primary tumor identified. Normal chromogranin A 07/05/2018 and normal 24-hour urine 5 HIAA 08/03/2018 Monthly lanreotide beginning 08/03/2018 CT 12/15/2018-small hepatic metastases, mildly improved, slight increase in size of a porta hepatis node CT abdomen/pelvis 07/02/2019-some of the liver lesions are slightly larger, others stable or smaller, slight increase in splenic lesions, minimal increase in abdominal adenopathy Cycle 1 Lutathera 08/14/2019 Cycle 2 Lutathera 10/08/2019 Cycle 3 Lutathera 12/03/2019 Cycle 4 Lutathera 01/29/2020 Netspot 03/11/2020-marked reduction in activity within hepatic metastases with only 1 remaining measurable lesion, decrease in size and radiotracer activity involving periportal and peripancreatic lymph nodes, decreased radiotracer activity at a  subcarinal node and resolution of activity at a left supraclavicular node, resolution of focal metabolic activity in the right humerus, near complete resolution of activity in the left T3 transverse process, decreased activity involving a right acetabular lesion, no evidence of new or progressive disease Monthly lanreotide continued Netspot 12/22/2020 (outside facility)-subjective stability of disease-very small left hepatic lobe and caudate metastatic lesions.  Additional hepatic metastatic lesions not completely excluded.  Metastatic hepatic hilar/portacaval node.  Low suspicion subcarinal lymph node. EUS 01/08/2021-suspicion of Barrett's esophagus 35 cm from the incisors,, no gross lesion in the duodenum or visualized jejunum, no significant pathology in the pancreas, common bile duct, no malignant appearing lymph nodes in the celiac, peripancreatic, porta hepatis region Netspot 12/29/2018 23-2 radiotracer avid lesions remain in the liver, small with activity minimally above background, no evidence of disease progression.  No evidence of neuroendocrine tumor elsewhere including mediastinal lymph nodes, periportal lymph nodes, and skeletal metastases 3.   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 Donne Hazel Bilateral breast MRI 11/11/2020-no MRI evidence for malignancy in either breast. Bilateral mammogram 06/15/2021-no evidence of malignancy. Bilateral breast MRI 12/07/2021-no evidence of malignancy, 1 cm enhancing mass at the left liver unchanged-compatible with known neuroendocrine metastasis 4.   Hypercholesterolemia 5.   Nausea and vomiting following cycle 1 Lutathera      Disposition: Ms Duca appears stable.  There is no clinical or radiologic evidence for progression of the carcinoid tumor.  She will continue monthly lanreotide.  We will check a chromogranin a level and chemistry panel when she is here in March.  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  She continues  tamoxifen.  She will be scheduled for an office visit in 4 months.  Betsy Coder, MD  12/29/2021  8:37 AM

## 2022-01-26 ENCOUNTER — Inpatient Hospital Stay: Payer: No Typology Code available for payment source | Attending: Nurse Practitioner

## 2022-01-26 ENCOUNTER — Other Ambulatory Visit: Payer: PRIVATE HEALTH INSURANCE

## 2022-01-26 VITALS — BP 108/64 | HR 70 | Temp 97.8°F | Resp 18

## 2022-01-26 DIAGNOSIS — Z79899 Other long term (current) drug therapy: Secondary | ICD-10-CM | POA: Diagnosis not present

## 2022-01-26 DIAGNOSIS — C7A098 Malignant carcinoid tumors of other sites: Secondary | ICD-10-CM | POA: Insufficient documentation

## 2022-01-26 DIAGNOSIS — C7A8 Other malignant neuroendocrine tumors: Secondary | ICD-10-CM

## 2022-01-26 MED ORDER — LANREOTIDE ACETATE 120 MG/0.5ML ~~LOC~~ SOLN
120.0000 mg | Freq: Once | SUBCUTANEOUS | Status: AC
  Administered 2022-01-26: 120 mg via SUBCUTANEOUS
  Filled 2022-01-26: qty 120

## 2022-01-26 NOTE — Patient Instructions (Signed)
Lanreotide Injection What is this medication? LANREOTIDE (lan REE oh tide) treats high levels of growth hormone (acromegaly). It is used when other therapies have not worked well enough or cannot be tolerated. It works by reducing the amount of growth hormone your body makes. This reduces symptoms and the risk of health problems caused by too much growth hormone, such as diabetes and heart disease. It may also be used to treat neuroendocrine tumors, a cancer of the cells that release hormones and other substances in your body. It works by slowing down the release of these substances from the cells. This slows tumor growth. It also decreases the symptoms of carcinoid syndrome, such as flushing or diarrhea. This medicine may be used for other purposes; ask your health care provider or pharmacist if you have questions. COMMON BRAND NAME(S): Somatuline Depot What should I tell my care team before I take this medication? They need to know if you have any of these conditions: Diabetes Gallbladder disease Heart disease Kidney disease Liver disease Thyroid disease An unusual or allergic reaction to lanreotide, other medications, foods, dyes, or preservatives Pregnant or trying to get pregnant Breast-feeding How should I use this medication? This medication is injected under the skin. It is given by your care team in a hospital or clinic setting. Talk to your care team about the use of this medication in children. Special care may be needed. Overdosage: If you think you have taken too much of this medicine contact a poison control center or emergency room at once. NOTE: This medicine is only for you. Do not share this medicine with others. What if I miss a dose? Keep appointments for follow-up doses. It is important not to miss your dose. Call your care team if you are unable to keep an appointment. What may interact with this medication? Bromocriptine Cyclosporine Certain medications for blood  pressure, heart disease, irregular heartbeat Certain medications for diabetes Quinidine Terfenadine This list may not describe all possible interactions. Give your health care provider a list of all the medicines, herbs, non-prescription drugs, or dietary supplements you use. Also tell them if you smoke, drink alcohol, or use illegal drugs. Some items may interact with your medicine. What should I watch for while using this medication? Visit your care team for regular checks on your progress. Tell your care team if your symptoms do not start to get better or if they get worse. Your condition will be monitored carefully while you are receiving this medication. You may need blood work while you are taking this medication. This medication may increase blood sugar. The risk may be higher in patients who already have diabetes. Ask your care team what you can do to lower your risk of diabetes while taking this medication. Talk to your care team if you wish to become pregnant or think you may be pregnant. This medication can cause serious birth defects. Do not breast-feed while taking this medication and for 6 months after stopping therapy. This medication may cause infertility. Talk to your care team if you are concerned about your fertility. What side effects may I notice from receiving this medication? Side effects that you should report to your care team as soon as possible: Allergic reactions--skin rash, itching, hives, swelling of the face, lips, tongue, or throat Gallbladder problems--severe stomach pain, nausea, vomiting, fever High blood sugar (hyperglycemia)--increased thirst or amount of urine, unusual weakness or fatigue, blurry vision Increase in blood pressure Low blood sugar (hypoglycemia)--tremors or shaking, anxiety, sweating, cold   or clammy skin, confusion, dizziness, rapid heartbeat Low thyroid levels (hypothyroidism)--unusual weakness or fatigue, increased sensitivity to cold,  constipation, hair loss, dry skin, weight gain, feelings of depression Slow heartbeat--dizziness, feeling faint or lightheaded, confusion, trouble breathing, unusual weakness or fatigue Side effects that usually do not require medical attention (report to your care team if they continue or are bothersome): Diarrhea Dizziness Headache Muscle spasms Nausea Pain, redness, irritation, or bruising at the injection site Stomach pain This list may not describe all possible side effects. Call your doctor for medical advice about side effects. You may report side effects to FDA at 1-800-FDA-1088. Where should I keep my medication? This medication is given in a hospital or clinic. It will not be stored at home. NOTE: This sheet is a summary. It may not cover all possible information. If you have questions about this medicine, talk to your doctor, pharmacist, or health care provider.  2023 Elsevier/Gold Standard (2021-02-27 00:00:00)  

## 2022-02-23 ENCOUNTER — Inpatient Hospital Stay: Payer: No Typology Code available for payment source | Attending: Nurse Practitioner

## 2022-02-23 VITALS — BP 118/67 | HR 60 | Temp 97.9°F | Resp 20 | Ht 62.0 in | Wt 169.2 lb

## 2022-02-23 DIAGNOSIS — C7A098 Malignant carcinoid tumors of other sites: Secondary | ICD-10-CM | POA: Insufficient documentation

## 2022-02-23 DIAGNOSIS — C7B8 Other secondary neuroendocrine tumors: Secondary | ICD-10-CM

## 2022-02-23 MED ORDER — LANREOTIDE ACETATE 120 MG/0.5ML ~~LOC~~ SOLN
120.0000 mg | Freq: Once | SUBCUTANEOUS | Status: AC
  Administered 2022-02-23: 120 mg via SUBCUTANEOUS
  Filled 2022-02-23: qty 120

## 2022-02-23 NOTE — Patient Instructions (Signed)
Lanreotide Injection What is this medication? LANREOTIDE (lan REE oh tide) treats high levels of growth hormone (acromegaly). It is used when other therapies have not worked well enough or cannot be tolerated. It works by reducing the amount of growth hormone your body makes. This reduces symptoms and the risk of health problems caused by too much growth hormone, such as diabetes and heart disease. It may also be used to treat neuroendocrine tumors, a cancer of the cells that release hormones and other substances in your body. It works by slowing down the release of these substances from the cells. This slows tumor growth. It also decreases the symptoms of carcinoid syndrome, such as flushing or diarrhea. This medicine may be used for other purposes; ask your health care provider or pharmacist if you have questions. COMMON BRAND NAME(S): Somatuline Depot What should I tell my care team before I take this medication? They need to know if you have any of these conditions: Diabetes Gallbladder disease Heart disease Kidney disease Liver disease Thyroid disease An unusual or allergic reaction to lanreotide, other medications, foods, dyes, or preservatives Pregnant or trying to get pregnant Breast-feeding How should I use this medication? This medication is injected under the skin. It is given by your care team in a hospital or clinic setting. Talk to your care team about the use of this medication in children. Special care may be needed. Overdosage: If you think you have taken too much of this medicine contact a poison control center or emergency room at once. NOTE: This medicine is only for you. Do not share this medicine with others. What if I miss a dose? Keep appointments for follow-up doses. It is important not to miss your dose. Call your care team if you are unable to keep an appointment. What may interact with this medication? Bromocriptine Cyclosporine Certain medications for blood  pressure, heart disease, irregular heartbeat Certain medications for diabetes Quinidine Terfenadine This list may not describe all possible interactions. Give your health care provider a list of all the medicines, herbs, non-prescription drugs, or dietary supplements you use. Also tell them if you smoke, drink alcohol, or use illegal drugs. Some items may interact with your medicine. What should I watch for while using this medication? Visit your care team for regular checks on your progress. Tell your care team if your symptoms do not start to get better or if they get worse. Your condition will be monitored carefully while you are receiving this medication. You may need blood work while you are taking this medication. This medication may increase blood sugar. The risk may be higher in patients who already have diabetes. Ask your care team what you can do to lower your risk of diabetes while taking this medication. Talk to your care team if you wish to become pregnant or think you may be pregnant. This medication can cause serious birth defects. Do not breast-feed while taking this medication and for 6 months after stopping therapy. This medication may cause infertility. Talk to your care team if you are concerned about your fertility. What side effects may I notice from receiving this medication? Side effects that you should report to your care team as soon as possible: Allergic reactions--skin rash, itching, hives, swelling of the face, lips, tongue, or throat Gallbladder problems--severe stomach pain, nausea, vomiting, fever High blood sugar (hyperglycemia)--increased thirst or amount of urine, unusual weakness or fatigue, blurry vision Increase in blood pressure Low blood sugar (hypoglycemia)--tremors or shaking, anxiety, sweating, cold   or clammy skin, confusion, dizziness, rapid heartbeat Low thyroid levels (hypothyroidism)--unusual weakness or fatigue, increased sensitivity to cold,  constipation, hair loss, dry skin, weight gain, feelings of depression Slow heartbeat--dizziness, feeling faint or lightheaded, confusion, trouble breathing, unusual weakness or fatigue Side effects that usually do not require medical attention (report to your care team if they continue or are bothersome): Diarrhea Dizziness Headache Muscle spasms Nausea Pain, redness, irritation, or bruising at the injection site Stomach pain This list may not describe all possible side effects. Call your doctor for medical advice about side effects. You may report side effects to FDA at 1-800-FDA-1088. Where should I keep my medication? This medication is given in a hospital or clinic. It will not be stored at home. NOTE: This sheet is a summary. It may not cover all possible information. If you have questions about this medicine, talk to your doctor, pharmacist, or health care provider.  2023 Elsevier/Gold Standard (2021-02-27 00:00:00)  

## 2022-03-23 ENCOUNTER — Inpatient Hospital Stay: Payer: No Typology Code available for payment source

## 2022-03-23 ENCOUNTER — Inpatient Hospital Stay: Payer: No Typology Code available for payment source | Attending: Nurse Practitioner

## 2022-03-23 VITALS — BP 108/71 | HR 55 | Temp 97.8°F | Resp 18 | Ht 62.0 in | Wt 168.5 lb

## 2022-03-23 DIAGNOSIS — Z79899 Other long term (current) drug therapy: Secondary | ICD-10-CM | POA: Diagnosis not present

## 2022-03-23 DIAGNOSIS — C7A098 Malignant carcinoid tumors of other sites: Secondary | ICD-10-CM | POA: Diagnosis not present

## 2022-03-23 DIAGNOSIS — C7A8 Other malignant neuroendocrine tumors: Secondary | ICD-10-CM

## 2022-03-23 LAB — CMP (CANCER CENTER ONLY)
ALT: 30 U/L (ref 0–44)
AST: 24 U/L (ref 15–41)
Albumin: 4.3 g/dL (ref 3.5–5.0)
Alkaline Phosphatase: 94 U/L (ref 38–126)
Anion gap: 6 (ref 5–15)
BUN: 18 mg/dL (ref 6–20)
CO2: 30 mmol/L (ref 22–32)
Calcium: 9.9 mg/dL (ref 8.9–10.3)
Chloride: 105 mmol/L (ref 98–111)
Creatinine: 0.84 mg/dL (ref 0.44–1.00)
GFR, Estimated: >60 mL/min (ref >60)
Glucose, Bld: 132 mg/dL — ABNORMAL HIGH (ref 70–99)
Potassium: 4.5 mmol/L (ref 3.5–5.1)
Sodium: 141 mmol/L (ref 135–145)
Total Bilirubin: 0.5 mg/dL (ref 0.3–1.2)
Total Protein: 7.2 g/dL (ref 6.5–8.1)

## 2022-03-23 MED ORDER — LANREOTIDE ACETATE 120 MG/0.5ML ~~LOC~~ SOLN
120.0000 mg | Freq: Once | SUBCUTANEOUS | Status: AC
  Administered 2022-03-23: 120 mg via SUBCUTANEOUS
  Filled 2022-03-23: qty 120

## 2022-03-23 NOTE — Patient Instructions (Signed)
Lanreotide Injection What is this medication? LANREOTIDE (lan REE oh tide) treats high levels of growth hormone (acromegaly). It is used when other therapies have not worked well enough or cannot be tolerated. It works by reducing the amount of growth hormone your body makes. This reduces symptoms and the risk of health problems caused by too much growth hormone, such as diabetes and heart disease. It may also be used to treat neuroendocrine tumors, a cancer of the cells that release hormones and other substances in your body. It works by slowing down the release of these substances from the cells. This slows tumor growth. It also decreases the symptoms of carcinoid syndrome, such as flushing or diarrhea. This medicine may be used for other purposes; ask your health care provider or pharmacist if you have questions. COMMON BRAND NAME(S): Somatuline Depot What should I tell my care team before I take this medication? They need to know if you have any of these conditions: Diabetes Gallbladder disease Heart disease Kidney disease Liver disease Thyroid disease An unusual or allergic reaction to lanreotide, other medications, foods, dyes, or preservatives Pregnant or trying to get pregnant Breast-feeding How should I use this medication? This medication is injected under the skin. It is given by your care team in a hospital or clinic setting. Talk to your care team about the use of this medication in children. Special care may be needed. Overdosage: If you think you have taken too much of this medicine contact a poison control center or emergency room at once. NOTE: This medicine is only for you. Do not share this medicine with others. What if I miss a dose? Keep appointments for follow-up doses. It is important not to miss your dose. Call your care team if you are unable to keep an appointment. What may interact with this medication? Bromocriptine Cyclosporine Certain medications for blood  pressure, heart disease, irregular heartbeat Certain medications for diabetes Quinidine Terfenadine This list may not describe all possible interactions. Give your health care provider a list of all the medicines, herbs, non-prescription drugs, or dietary supplements you use. Also tell them if you smoke, drink alcohol, or use illegal drugs. Some items may interact with your medicine. What should I watch for while using this medication? Visit your care team for regular checks on your progress. Tell your care team if your symptoms do not start to get better or if they get worse. Your condition will be monitored carefully while you are receiving this medication. You may need blood work while you are taking this medication. This medication may increase blood sugar. The risk may be higher in patients who already have diabetes. Ask your care team what you can do to lower your risk of diabetes while taking this medication. Talk to your care team if you wish to become pregnant or think you may be pregnant. This medication can cause serious birth defects. Do not breast-feed while taking this medication and for 6 months after stopping therapy. This medication may cause infertility. Talk to your care team if you are concerned about your fertility. What side effects may I notice from receiving this medication? Side effects that you should report to your care team as soon as possible: Allergic reactions--skin rash, itching, hives, swelling of the face, lips, tongue, or throat Gallbladder problems--severe stomach pain, nausea, vomiting, fever High blood sugar (hyperglycemia)--increased thirst or amount of urine, unusual weakness or fatigue, blurry vision Increase in blood pressure Low blood sugar (hypoglycemia)--tremors or shaking, anxiety, sweating, cold   or clammy skin, confusion, dizziness, rapid heartbeat Low thyroid levels (hypothyroidism)--unusual weakness or fatigue, increased sensitivity to cold,  constipation, hair loss, dry skin, weight gain, feelings of depression Slow heartbeat--dizziness, feeling faint or lightheaded, confusion, trouble breathing, unusual weakness or fatigue Side effects that usually do not require medical attention (report to your care team if they continue or are bothersome): Diarrhea Dizziness Headache Muscle spasms Nausea Pain, redness, irritation, or bruising at the injection site Stomach pain This list may not describe all possible side effects. Call your doctor for medical advice about side effects. You may report side effects to FDA at 1-800-FDA-1088. Where should I keep my medication? This medication is given in a hospital or clinic. It will not be stored at home. NOTE: This sheet is a summary. It may not cover all possible information. If you have questions about this medicine, talk to your doctor, pharmacist, or health care provider.  2023 Elsevier/Gold Standard (2021-02-27 00:00:00)  

## 2022-03-24 LAB — CHROMOGRANIN A: Chromogranin A (ng/mL): 54 ng/mL (ref 0.0–101.8)

## 2022-03-31 ENCOUNTER — Other Ambulatory Visit: Payer: Self-pay | Admitting: Family Medicine

## 2022-03-31 DIAGNOSIS — Z139 Encounter for screening, unspecified: Secondary | ICD-10-CM

## 2022-04-08 ENCOUNTER — Encounter: Payer: Self-pay | Admitting: *Deleted

## 2022-04-08 NOTE — Progress Notes (Signed)
Ms. Kaminski reports her out-of-pocket cost every month for the lanreotide 120 mg is ~ $600 and she can't afford this. Insurance told her they will cover brand name in her prescription plan and she will find some place to administer it for her. She sent the script to [facility] and it was denied through [facility].  She is requesting Dr. Benay Spice initiate the appeal process for her with a letter of medical necessity for the drug and was informed by insurance carrier it needs to be brand name. MD notified.

## 2022-04-16 ENCOUNTER — Telehealth: Payer: Self-pay | Admitting: *Deleted

## 2022-04-16 MED ORDER — LANREOTIDE ACETATE 120 MG/0.5ML ~~LOC~~ SOLN
120.0000 mg | SUBCUTANEOUS | 11 refills | Status: DC
  Filled 2022-04-16: qty 0.5, 28d supply, fill #0

## 2022-04-16 NOTE — Telephone Encounter (Signed)
Informed her that per [facility] there is no need to appeal. The Somatuline Depot is not covered with the coupon card she used. Sally Brown said she did not use a coupon card. Will try to send to [facility] to see if she can get it through them. She agrees to try this.

## 2022-04-17 ENCOUNTER — Other Ambulatory Visit (HOSPITAL_COMMUNITY): Payer: Self-pay

## 2022-04-19 ENCOUNTER — Other Ambulatory Visit (HOSPITAL_COMMUNITY): Payer: Self-pay

## 2022-04-20 ENCOUNTER — Inpatient Hospital Stay: Payer: No Typology Code available for payment source | Attending: Nurse Practitioner | Admitting: Oncology

## 2022-04-20 ENCOUNTER — Inpatient Hospital Stay: Payer: No Typology Code available for payment source

## 2022-04-20 VITALS — BP 114/74 | HR 70 | Temp 98.1°F | Resp 20 | Ht 62.0 in | Wt 168.4 lb

## 2022-04-20 DIAGNOSIS — Z79810 Long term (current) use of selective estrogen receptor modulators (SERMs): Secondary | ICD-10-CM | POA: Insufficient documentation

## 2022-04-20 DIAGNOSIS — C7A8 Other malignant neuroendocrine tumors: Secondary | ICD-10-CM

## 2022-04-20 DIAGNOSIS — R112 Nausea with vomiting, unspecified: Secondary | ICD-10-CM | POA: Insufficient documentation

## 2022-04-20 DIAGNOSIS — C7A098 Malignant carcinoid tumors of other sites: Secondary | ICD-10-CM | POA: Diagnosis present

## 2022-04-20 DIAGNOSIS — C7B8 Other secondary neuroendocrine tumors: Secondary | ICD-10-CM

## 2022-04-20 DIAGNOSIS — E7800 Pure hypercholesterolemia, unspecified: Secondary | ICD-10-CM | POA: Diagnosis not present

## 2022-04-20 MED ORDER — LANREOTIDE ACETATE 120 MG/0.5ML ~~LOC~~ SOLN
120.0000 mg | Freq: Once | SUBCUTANEOUS | Status: AC
  Administered 2022-04-20: 120 mg via SUBCUTANEOUS
  Filled 2022-04-20: qty 120

## 2022-04-20 NOTE — Progress Notes (Signed)
[facility] OFFICE PROGRESS NOTE   Diagnosis: Carcinoid tumor  INTERVAL HISTORY:   Sally Brown is as scheduled.  She feels well.  Good appetite and energy level.  No difficulty with bowel function.  No flushing or diarrhea.  She continues tamoxifen.  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  Objective:  Vital signs in last 24 hours:  Blood pressure 114/74, pulse 70, temperature 98.1 F (36.7 C), temperature source Oral, resp. rate 20, height 5\' 2"  (1.575 m), weight 168 lb 6.4 oz (76.4 kg), last menstrual period 05/17/2017, SpO2 98 %.     Lymphatics: No cervical, supraclavicular, axillary, or inguinal nodes Resp: Lungs clear bilaterally Cardio: Regular rate and rhythm GI: No hepatosplenomegaly, no mass, nontender Vascular: No leg edema   Lab Results:  Lab Results  Component Value Date   WBC 6.0 04/14/2021   HGB 12.7 04/14/2021   HCT 38.0 04/14/2021   MCV 91.8 04/14/2021   PLT 237 04/14/2021   NEUTROABS 3.6 04/14/2021    CMP  Lab Results  Component Value Date   NA 141 03/23/2022   K 4.5 03/23/2022   CL 105 03/23/2022   CO2 30 03/23/2022   GLUCOSE 132 (H) 03/23/2022   BUN 18 03/23/2022   CREATININE 0.84 03/23/2022   CALCIUM 9.9 03/23/2022   PROT 7.2 03/23/2022   ALBUMIN 4.3 03/23/2022   AST 24 03/23/2022   ALT 30 03/23/2022   ALKPHOS 94 03/23/2022   BILITOT 0.5 03/23/2022   GFRNONAA >60 03/23/2022   GFRAA >60 10/08/2019    Medications: I have reviewed the patient's current medications.   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   06/07/2018 MRI of the liver-numerous hepatic lesions scattered throughout all aspects of the liver.  06/14/2018 PET scan-enlarged and hypermetabolic perihepatic and portacaval lymph nodes; diffuse heterogeneous uptake within the liver with subtle areas of mildly increased FDG uptake corresponding to suspicious lesions identified on MRI.    06/21/2018 biopsy of a right liver lobe mass-low-grade neuroendocrine tumor consistent with metastasis, positive CD56, chromogranin, synaptophysin, CDX-2 and cytokeratin AE1/AE3. 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  Single focus of uptake in the proximal right humerus.  No clear primary tumor identified. Normal chromogranin A 07/05/2018 and normal 24-hour urine 5 HIAA 08/03/2018 Monthly lanreotide beginning 08/03/2018 CT 12/15/2018-small hepatic metastases, mildly improved, slight increase in size of a porta hepatis node CT abdomen/pelvis 07/02/2019-some of the liver lesions are slightly larger, others stable or smaller, slight increase in splenic lesions, minimal increase in abdominal adenopathy Cycle 1 Lutathera 08/14/2019 Cycle 2 Lutathera 10/08/2019 Cycle 3 Lutathera 12/03/2019 Cycle 4 Lutathera 01/29/2020 Netspot 03/11/2020-marked reduction in activity within hepatic metastases with only 1 remaining measurable lesion, decrease in size and radiotracer activity involving periportal and peripancreatic lymph nodes, decreased radiotracer activity at a subcarinal node and resolution of activity at a left supraclavicular node, resolution of focal metabolic activity in the right humerus, near complete resolution of activity in the left T3 transverse process, decreased activity involving a right acetabular lesion, no evidence of new or progressive disease Monthly lanreotide continued Netspot 12/22/2020 (outside facility)-subjective stability of disease-very small left hepatic lobe and caudate metastatic lesions.  Additional hepatic metastatic lesions not completely excluded.  Metastatic hepatic hilar/portacaval node.  Low suspicion subcarinal lymph node. EUS 01/08/2021-suspicion of Barrett's esophagus 35 cm from the incisors,, no gross  lesion in the duodenum or visualized jejunum, no significant pathology in the pancreas, common bile duct, no malignant appearing lymph nodes in the celiac, peripancreatic, porta hepatis region Netspot 12/29/2018 23-2 radiotracer avid lesions remain in the liver, small with activity minimally above background, no evidence of disease progression.  No evidence of neuroendocrine tumor elsewhere including mediastinal lymph nodes, periportal lymph nodes, and skeletal metastases 3.   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 Dwain Sarna Bilateral breast MRI 11/11/2020-no MRI evidence for malignancy in either breast. Bilateral mammogram 06/15/2021-no evidence of malignancy. Bilateral breast MRI 12/07/2021-no evidence of malignancy, 1 cm enhancing mass at the left liver unchanged-compatible with known neuroendocrine metastasis 4.   Hypercholesterolemia 5.   Nausea and vomiting following cycle 1 Lutathera       Disposition: Sally Brown appears stable.  There is no clinical evidence for progression of the carcinoid tumor.  She will continue monthly lanreotide.  She is working with her [employer] with the hope of being able to receive the lanreotide injection outside of the hospital-based facility.  She will return for an office visit in 6 months.  We will plan for a Netspot in December.  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  Thornton Papas, MD  04/20/2022  8:23 AM

## 2022-05-18 ENCOUNTER — Inpatient Hospital Stay: Payer: No Typology Code available for payment source | Attending: Nurse Practitioner

## 2022-05-18 VITALS — BP 122/76 | HR 61 | Temp 97.5°F | Resp 20 | Ht 62.0 in | Wt 170.6 lb

## 2022-05-18 DIAGNOSIS — Z79899 Other long term (current) drug therapy: Secondary | ICD-10-CM | POA: Diagnosis not present

## 2022-05-18 DIAGNOSIS — C7A8 Other malignant neuroendocrine tumors: Secondary | ICD-10-CM

## 2022-05-18 DIAGNOSIS — C7A098 Malignant carcinoid tumors of other sites: Secondary | ICD-10-CM | POA: Insufficient documentation

## 2022-05-18 MED ORDER — LANREOTIDE ACETATE 120 MG/0.5ML ~~LOC~~ SOLN
120.0000 mg | Freq: Once | SUBCUTANEOUS | Status: AC
  Administered 2022-05-18: 120 mg via SUBCUTANEOUS
  Filled 2022-05-18: qty 120

## 2022-05-18 NOTE — Patient Instructions (Signed)
[facility]  Discharge Instructions: Thank you for choosing [facility] to provide your oncology and hematology care.   If you have a lab appointment with the Cancer Center, please go directly to the Cancer Center and check in at the registration area.   Wear comfortable clothing and clothing appropriate for easy access to any Portacath or PICC line.   We strive to give you quality time with your provider. You may need to reschedule your appointment if you arrive late (15 or more minutes).  Arriving late affects you and other patients whose appointments are after yours.  Also, if you miss three or more appointments without notifying the office, you may be dismissed from the clinic at the provider's discretion.      For prescription refill requests, have your pharmacy contact our office and allow 72 hours for refills to be completed.    Today you received the following chemotherapy and/or immunotherapy agents Lanreotide.      To help prevent nausea and vomiting after your treatment, we encourage you to take your nausea medication as directed.  BELOW ARE SYMPTOMS THAT SHOULD BE REPORTED IMMEDIATELY: *FEVER GREATER THAN 100.4 F (38 C) OR HIGHER *CHILLS OR SWEATING *NAUSEA AND VOMITING THAT IS NOT CONTROLLED WITH YOUR NAUSEA MEDICATION *UNUSUAL SHORTNESS OF BREATH *UNUSUAL BRUISING OR BLEEDING *URINARY PROBLEMS (pain or burning when urinating, or frequent urination) *BOWEL PROBLEMS (unusual diarrhea, constipation, pain near the anus) TENDERNESS IN MOUTH AND THROAT WITH OR WITHOUT PRESENCE OF ULCERS (sore throat, sores in mouth, or a toothache) UNUSUAL RASH, SWELLING OR PAIN  UNUSUAL VAGINAL DISCHARGE OR ITCHING   Items with * indicate a potential emergency and should be followed up as soon as possible or go to the Emergency Department if any problems should occur.  Please show the CHEMOTHERAPY ALERT CARD or IMMUNOTHERAPY ALERT CARD at  check-in to the Emergency Department and triage nurse.  Should you have questions after your visit or need to cancel or reschedule your appointment, please contact [facility]  Dept: [phone]  and follow the prompts.  Office hours are 8:00 a.m. to 4:30 p.m. Monday - Friday. Please note that voicemails left after 4:00 p.m. may not be returned until the following business day.  We are closed weekends and major holidays. You have access to a nurse at all times for urgent questions. Please call the main number to the clinic Dept: [phone] and follow the prompts.   For any non-urgent questions, you may also contact your provider using MyChart. We now offer e-Visits for anyone 18 and older to request care online for non-urgent symptoms. For details visit [email or web address].   Also download the MyChart app! Go to the app store, search "MyChart", open the app, select [facility], and log in with your MyChart username and password.  Lanreotide Injection What is this medication? LANREOTIDE (lan REE oh tide) treats high levels of growth hormone (acromegaly). It is used when other therapies have not worked well enough or cannot be tolerated. It works by reducing the amount of growth hormone your body makes. This reduces symptoms and the risk of health problems caused by too much growth hormone, such as diabetes and heart disease. It may also be used to treat neuroendocrine tumors, a cancer of the cells that release hormones and other substances in your body. It works by slowing down the release of these substances from the cells. This slows tumor growth. It also   decreases the symptoms of carcinoid syndrome, such as flushing or diarrhea. This medicine may be used for other purposes; ask your health care provider or pharmacist if you have questions. COMMON BRAND NAME(S): Somatuline Depot What should I tell my care team before I take this medication? They need to know  if you have any of these conditions: Diabetes Gallbladder disease Heart disease Kidney disease Liver disease Thyroid disease An unusual or allergic reaction to lanreotide, other medications, foods, dyes, or preservatives Pregnant or trying to get pregnant Breast-feeding How should I use this medication? This medication is injected under the skin. It is given by your care team in a hospital or clinic setting. Talk to your care team about the use of this medication in children. Special care may be needed. Overdosage: If you think you have taken too much of this medicine contact a poison control center or emergency room at once. NOTE: This medicine is only for you. Do not share this medicine with others. What if I miss a dose? Keep appointments for follow-up doses. It is important not to miss your dose. Call your care team if you are unable to keep an appointment. What may interact with this medication? Bromocriptine Cyclosporine Certain medications for blood pressure, heart disease, irregular heartbeat Certain medications for diabetes Quinidine Terfenadine This list may not describe all possible interactions. Give your health care provider a list of all the medicines, herbs, non-prescription drugs, or dietary supplements you use. Also tell them if you smoke, drink alcohol, or use illegal drugs. Some items may interact with your medicine. What should I watch for while using this medication? Visit your care team for regular checks on your progress. Tell your care team if your symptoms do not start to get better or if they get worse. Your condition will be monitored carefully while you are receiving this medication. You may need blood work while you are taking this medication. This medication may increase blood sugar. The risk may be higher in patients who already have diabetes. Ask your care team what you can do to lower your risk of diabetes while taking this medication. Talk to your care  team if you wish to become pregnant or think you may be pregnant. This medication can cause serious birth defects. Do not breast-feed while taking this medication and for 6 months after stopping therapy. This medication may cause infertility. Talk to your care team if you are concerned about your fertility. What side effects may I notice from receiving this medication? Side effects that you should report to your care team as soon as possible: Allergic reactions--skin rash, itching, hives, swelling of the face, lips, tongue, or throat Gallbladder problems--severe stomach pain, nausea, vomiting, fever High blood sugar (hyperglycemia)--increased thirst or amount of urine, unusual weakness or fatigue, blurry vision Increase in blood pressure Low blood sugar (hypoglycemia)--tremors or shaking, anxiety, sweating, cold or clammy skin, confusion, dizziness, rapid heartbeat Low thyroid levels (hypothyroidism)--unusual weakness or fatigue, increased sensitivity to cold, constipation, hair loss, dry skin, weight gain, feelings of depression Slow heartbeat--dizziness, feeling faint or lightheaded, confusion, trouble breathing, unusual weakness or fatigue Side effects that usually do not require medical attention (report to your care team if they continue or are bothersome): Diarrhea Dizziness Headache Muscle spasms Nausea Pain, redness, irritation, or bruising at the injection site Stomach pain This list may not describe all possible side effects. Call your doctor for medical advice about side effects. You may report side effects to FDA at 1-800-FDA-1088. Where should   I keep my medication? This medication is given in a hospital or clinic. It will not be stored at home. NOTE: This sheet is a summary. It may not cover all possible information. If you have questions about this medicine, talk to your doctor, pharmacist, or health care provider.  2023 Elsevier/Gold Standard (2021-02-27 00:00:00)   

## 2022-06-09 ENCOUNTER — Other Ambulatory Visit: Payer: Self-pay | Admitting: *Deleted

## 2022-06-09 ENCOUNTER — Other Ambulatory Visit (HOSPITAL_COMMUNITY): Payer: Self-pay

## 2022-06-09 ENCOUNTER — Encounter: Payer: Self-pay | Admitting: Oncology

## 2022-06-09 MED ORDER — LANREOTIDE ACETATE 120 MG/0.5ML ~~LOC~~ SOLN: 120.0000 mg | SUBCUTANEOUS | 11 refills | Status: DC

## 2022-06-09 NOTE — Telephone Encounter (Signed)
Sally Brown called to report she can no longer afford the monthly copay for the Lanreotide. Per [facility]--not covered to fill there. Needs to go to [facility]. Script forward there and will see if it processes this.

## 2022-06-15 ENCOUNTER — Inpatient Hospital Stay: Payer: No Typology Code available for payment source | Attending: Nurse Practitioner

## 2022-06-15 VITALS — BP 107/70 | HR 52 | Temp 98.1°F | Resp 18 | Wt 170.8 lb

## 2022-06-15 DIAGNOSIS — C7A098 Malignant carcinoid tumors of other sites: Secondary | ICD-10-CM | POA: Insufficient documentation

## 2022-06-15 DIAGNOSIS — Z79899 Other long term (current) drug therapy: Secondary | ICD-10-CM | POA: Insufficient documentation

## 2022-06-15 DIAGNOSIS — C7B8 Other secondary neuroendocrine tumors: Secondary | ICD-10-CM

## 2022-06-15 MED ORDER — LANREOTIDE ACETATE 120 MG/0.5ML ~~LOC~~ SOLN
120.0000 mg | Freq: Once | SUBCUTANEOUS | Status: AC
  Administered 2022-06-15: 120 mg via SUBCUTANEOUS
  Filled 2022-06-15: qty 120

## 2022-06-17 ENCOUNTER — Ambulatory Visit
Admission: RE | Admit: 2022-06-17 | Discharge: 2022-06-17 | Disposition: A | Discharge status: Home / Self Care | Payer: No Typology Code available for payment source | Source: Ambulatory Visit | Attending: Family Medicine | Admitting: Family Medicine

## 2022-06-17 DIAGNOSIS — Z139 Encounter for screening, unspecified: Secondary | ICD-10-CM

## 2022-06-23 ENCOUNTER — Encounter: Payer: Self-pay | Admitting: *Deleted

## 2022-06-23 NOTE — Progress Notes (Signed)
Insurance denied [place] for lanreotide 120 mg monthly at home and copay for administration at Sally Brown is $600/month. Sent last office note to company and noted how long she is on this medication and this is 1st line therapy for her diagnosis of metastatic carcinoid to liver. Awaiting response. Sent message to managed care to determine if there is any other assistance they can locate.

## 2022-07-13 ENCOUNTER — Encounter: Payer: Self-pay | Admitting: *Deleted

## 2022-07-13 ENCOUNTER — Inpatient Hospital Stay: Payer: No Typology Code available for payment source | Attending: Nurse Practitioner

## 2022-07-13 VITALS — BP 99/68 | HR 62 | Temp 97.7°F | Resp 18 | Ht 62.0 in | Wt 168.0 lb

## 2022-07-13 DIAGNOSIS — Z79899 Other long term (current) drug therapy: Secondary | ICD-10-CM | POA: Insufficient documentation

## 2022-07-13 DIAGNOSIS — C7B8 Other secondary neuroendocrine tumors: Secondary | ICD-10-CM

## 2022-07-13 DIAGNOSIS — C7A098 Malignant carcinoid tumors of other sites: Secondary | ICD-10-CM | POA: Insufficient documentation

## 2022-07-13 MED ORDER — LANREOTIDE ACETATE 120 MG/0.5ML ~~LOC~~ SOLN
120.0000 mg | Freq: Once | SUBCUTANEOUS | Status: AC
  Administered 2022-07-13: 120 mg via SUBCUTANEOUS
  Filled 2022-07-13: qty 120

## 2022-07-13 NOTE — Patient Instructions (Signed)
[facility]   Discharge Instructions: Thank you for choosing [facility] to provide your oncology and hematology care.   If you have a lab appointment with the Cancer Center, please go directly to the Cancer Center and check in at the registration area.   Wear comfortable clothing and clothing appropriate for easy access to any Portacath or PICC line.   We strive to give you quality time with your provider. You may need to reschedule your appointment if you arrive late (15 or more minutes).  Arriving late affects you and other patients whose appointments are after yours.  Also, if you miss three or more appointments without notifying the office, you may be dismissed from the clinic at the provider's discretion.      For prescription refill requests, have your pharmacy contact our office and allow 72 hours for refills to be completed.    Today you received the following chemotherapy and/or immunotherapy agents Lanreotide.      To help prevent nausea and vomiting after your treatment, we encourage you to take your nausea medication as directed.  BELOW ARE SYMPTOMS THAT SHOULD BE REPORTED IMMEDIATELY: *FEVER GREATER THAN 100.4 F (38 C) OR HIGHER *CHILLS OR SWEATING *NAUSEA AND VOMITING THAT IS NOT CONTROLLED WITH YOUR NAUSEA MEDICATION *UNUSUAL SHORTNESS OF BREATH *UNUSUAL BRUISING OR BLEEDING *URINARY PROBLEMS (pain or burning when urinating, or frequent urination) *BOWEL PROBLEMS (unusual diarrhea, constipation, pain near the anus) TENDERNESS IN MOUTH AND THROAT WITH OR WITHOUT PRESENCE OF ULCERS (sore throat, sores in mouth, or a toothache) UNUSUAL RASH, SWELLING OR PAIN  UNUSUAL VAGINAL DISCHARGE OR ITCHING   Items with * indicate a potential emergency and should be followed up as soon as possible or go to the Emergency Department if any problems should occur.  Please show the CHEMOTHERAPY ALERT CARD or IMMUNOTHERAPY ALERT CARD at  check-in to the Emergency Department and triage nurse.  Should you have questions after your visit or need to cancel or reschedule your appointment, please contact [facility]  Dept: [phone]  and follow the prompts.  Office hours are 8:00 a.m. to 4:30 p.m. Monday - Friday. Please note that voicemails left after 4:00 p.m. may not be returned until the following business day.  We are closed weekends and major holidays. You have access to a nurse at all times for urgent questions. Please call the main number to the clinic Dept: [phone] and follow the prompts.   For any non-urgent questions, you may also contact your provider using MyChart. We now offer e-Visits for anyone 18 and older to request care online for non-urgent symptoms. For details visit [email or web address].   Also download the MyChart app! Go to the app store, search "MyChart", open the app, select , and log in with your MyChart username and password.  Lanreotide Injection What is this medication? LANREOTIDE (lan REE oh tide) treats high levels of growth hormone (acromegaly). It is used when other therapies have not worked well enough or cannot be tolerated. It works by reducing the amount of growth hormone your body makes. This reduces symptoms and the risk of health problems caused by too much growth hormone, such as diabetes and heart disease. It may also be used to treat neuroendocrine tumors, a cancer of the cells that release hormones and other substances in your body. It works by slowing down the release of these substances from the cells. This slows tumor growth. It   also decreases the symptoms of carcinoid syndrome, such as flushing or diarrhea. This medicine may be used for other purposes; ask your health care provider or pharmacist if you have questions. COMMON BRAND NAME(S): Somatuline Depot What should I tell my care team before I take this medication? They need to know  if you have any of these conditions: Diabetes Gallbladder disease Heart disease Kidney disease Liver disease Thyroid disease An unusual or allergic reaction to lanreotide, other medications, foods, dyes, or preservatives Pregnant or trying to get pregnant Breast-feeding How should I use this medication? This medication is injected under the skin. It is given by your care team in a hospital or clinic setting. Talk to your care team about the use of this medication in children. Special care may be needed. Overdosage: If you think you have taken too much of this medicine contact a poison control center or emergency room at once. NOTE: This medicine is only for you. Do not share this medicine with others. What if I miss a dose? Keep appointments for follow-up doses. It is important not to miss your dose. Call your care team if you are unable to keep an appointment. What may interact with this medication? Bromocriptine Cyclosporine Certain medications for blood pressure, heart disease, irregular heartbeat Certain medications for diabetes Quinidine Terfenadine This list may not describe all possible interactions. Give your health care provider a list of all the medicines, herbs, non-prescription drugs, or dietary supplements you use. Also tell them if you smoke, drink alcohol, or use illegal drugs. Some items may interact with your medicine. What should I watch for while using this medication? Visit your care team for regular checks on your progress. Tell your care team if your symptoms do not start to get better or if they get worse. Your condition will be monitored carefully while you are receiving this medication. You may need blood work while you are taking this medication. This medication may increase blood sugar. The risk may be higher in patients who already have diabetes. Ask your care team what you can do to lower your risk of diabetes while taking this medication. Talk to your care  team if you wish to become pregnant or think you may be pregnant. This medication can cause serious birth defects. Do not breast-feed while taking this medication and for 6 months after stopping therapy. This medication may cause infertility. Talk to your care team if you are concerned about your fertility. What side effects may I notice from receiving this medication? Side effects that you should report to your care team as soon as possible: Allergic reactions--skin rash, itching, hives, swelling of the face, lips, tongue, or throat Gallbladder problems--severe stomach pain, nausea, vomiting, fever High blood sugar (hyperglycemia)--increased thirst or amount of urine, unusual weakness or fatigue, blurry vision Increase in blood pressure Low blood sugar (hypoglycemia)--tremors or shaking, anxiety, sweating, cold or clammy skin, confusion, dizziness, rapid heartbeat Low thyroid levels (hypothyroidism)--unusual weakness or fatigue, increased sensitivity to cold, constipation, hair loss, dry skin, weight gain, feelings of depression Slow heartbeat--dizziness, feeling faint or lightheaded, confusion, trouble breathing, unusual weakness or fatigue Side effects that usually do not require medical attention (report to your care team if they continue or are bothersome): Diarrhea Dizziness Headache Muscle spasms Nausea Pain, redness, irritation, or bruising at the injection site Stomach pain This list may not describe all possible side effects. Call your doctor for medical advice about side effects. You may report side effects to FDA at 1-800-FDA-1088. Where   should I keep my medication? This medication is given in a hospital or clinic. It will not be stored at home. NOTE: This sheet is a summary. It may not cover all possible information. If you have questions about this medicine, talk to your doctor, pharmacist, or health care provider.  2024 Elsevier/Gold Standard (2021-05-20 00:00:00)   

## 2022-07-13 NOTE — Progress Notes (Signed)
Completed patients FMLA papers for intermittent leave for her monthly treatments.  Paperwork emailed back to Dr. Kalman Drape office for signature and to deliver to patient.

## 2022-08-10 ENCOUNTER — Inpatient Hospital Stay: Payer: No Typology Code available for payment source

## 2022-08-10 VITALS — BP 103/70 | HR 64 | Temp 98.2°F | Resp 18 | Ht 62.0 in | Wt 168.7 lb

## 2022-08-10 DIAGNOSIS — C7A8 Other malignant neuroendocrine tumors: Secondary | ICD-10-CM

## 2022-08-10 DIAGNOSIS — C7A098 Malignant carcinoid tumors of other sites: Secondary | ICD-10-CM | POA: Diagnosis not present

## 2022-08-10 MED ORDER — LANREOTIDE ACETATE 120 MG/0.5ML ~~LOC~~ SOLN
120.0000 mg | Freq: Once | SUBCUTANEOUS | Status: AC
  Administered 2022-08-10: 120 mg via SUBCUTANEOUS
  Filled 2022-08-10: qty 120

## 2022-08-10 NOTE — Patient Instructions (Addendum)
Lanreotide Injection What is this medication? LANREOTIDE (lan REE oh tide) treats high levels of growth hormone (acromegaly). It is used when other therapies have not worked well enough or cannot be tolerated. It works by reducing the amount of growth hormone your body makes. This reduces symptoms and the risk of health problems caused by too much growth hormone, such as diabetes and heart disease. It may also be used to treat neuroendocrine tumors, a cancer of the cells that release hormones and other substances in your body. It works by slowing down the release of these substances from the cells. This slows tumor growth. It also decreases the symptoms of carcinoid syndrome, such as flushing or diarrhea. This medicine may be used for other purposes; ask your health care provider or pharmacist if you have questions. COMMON BRAND NAME(S): Somatuline Depot What should I tell my care team before I take this medication? They need to know if you have any of these conditions: Diabetes Gallbladder disease Heart disease Kidney disease Liver disease Thyroid disease An unusual or allergic reaction to lanreotide, other medications, foods, dyes, or preservatives Pregnant or trying to get pregnant Breast-feeding How should I use this medication? This medication is injected under the skin. It is given by your care team in a hospital or clinic setting. Talk to your care team about the use of this medication in children. Special care may be needed. Overdosage: If you think you have taken too much of this medicine contact a poison control center or emergency room at once. NOTE: This medicine is only for you. Do not share this medicine with others. What if I miss a dose? Keep appointments for follow-up doses. It is important not to miss your dose. Call your care team if you are unable to keep an appointment. What may interact with this medication? Bromocriptine Cyclosporine Certain medications for blood  pressure, heart disease, irregular heartbeat Certain medications for diabetes Quinidine Terfenadine This list may not describe all possible interactions. Give your health care provider a list of all the medicines, herbs, non-prescription drugs, or dietary supplements you use. Also tell them if you smoke, drink alcohol, or use illegal drugs. Some items may interact with your medicine. What should I watch for while using this medication? Visit your care team for regular checks on your progress. Tell your care team if your symptoms do not start to get better or if they get worse. Your condition will be monitored carefully while you are receiving this medication. You may need blood work while you are taking this medication. This medication may increase blood sugar. The risk may be higher in patients who already have diabetes. Ask your care team what you can do to lower your risk of diabetes while taking this medication. Talk to your care team if you wish to become pregnant or think you may be pregnant. This medication can cause serious birth defects. Do not breast-feed while taking this medication and for 6 months after stopping therapy. This medication may cause infertility. Talk to your care team if you are concerned about your fertility. What side effects may I notice from receiving this medication? Side effects that you should report to your care team as soon as possible: Allergic reactions--skin rash, itching, hives, swelling of the face, lips, tongue, or throat Gallbladder problems--severe stomach pain, nausea, vomiting, fever High blood sugar (hyperglycemia)--increased thirst or amount of urine, unusual weakness or fatigue, blurry vision Increase in blood pressure Low blood sugar (hypoglycemia)--tremors or shaking, anxiety, sweating, cold   or clammy skin, confusion, dizziness, rapid heartbeat Low thyroid levels (hypothyroidism)--unusual weakness or fatigue, increased sensitivity to cold,  constipation, hair loss, dry skin, weight gain, feelings of depression Slow heartbeat--dizziness, feeling faint or lightheaded, confusion, trouble breathing, unusual weakness or fatigue Side effects that usually do not require medical attention (report to your care team if they continue or are bothersome): Diarrhea Dizziness Headache Muscle spasms Nausea Pain, redness, irritation, or bruising at the injection site Stomach pain This list may not describe all possible side effects. Call your doctor for medical advice about side effects. You may report side effects to FDA at 1-800-FDA-1088. Where should I keep my medication? This medication is given in a hospital or clinic. It will not be stored at home. NOTE: This sheet is a summary. It may not cover all possible information. If you have questions about this medicine, talk to your doctor, pharmacist, or health care provider.  2024 Elsevier/Gold Standard (2021-05-20 00:00:00)  

## 2022-09-07 ENCOUNTER — Inpatient Hospital Stay: Payer: No Typology Code available for payment source | Attending: Nurse Practitioner

## 2022-09-07 VITALS — BP 94/69 | HR 74 | Temp 96.9°F | Resp 18 | Ht 62.0 in | Wt 165.7 lb

## 2022-09-07 DIAGNOSIS — C7A098 Malignant carcinoid tumors of other sites: Secondary | ICD-10-CM | POA: Diagnosis present

## 2022-09-07 DIAGNOSIS — Z79899 Other long term (current) drug therapy: Secondary | ICD-10-CM | POA: Diagnosis not present

## 2022-09-07 DIAGNOSIS — C7B8 Other secondary neuroendocrine tumors: Secondary | ICD-10-CM

## 2022-09-07 MED ORDER — LANREOTIDE ACETATE 120 MG/0.5ML ~~LOC~~ SOLN
120.0000 mg | Freq: Once | SUBCUTANEOUS | Status: AC
  Administered 2022-09-07: 120 mg via SUBCUTANEOUS
  Filled 2022-09-07: qty 120

## 2022-09-07 NOTE — Patient Instructions (Signed)
[facility] [place] [facility]   Discharge Instructions: Thank you for choosing [facility] to provide your oncology and hematology care.   If you have a lab appointment with the Cancer Center, please go directly to the Cancer Center and check in at the registration area.   Wear comfortable clothing and clothing appropriate for easy access to any Portacath or PICC line.   We strive to give you quality time with your provider. You may need to reschedule your appointment if you arrive late (15 or more minutes).  Arriving late affects you and other patients whose appointments are after yours.  Also, if you miss three or more appointments without notifying the office, you may be dismissed from the clinic at the provider's discretion.      For prescription refill requests, have your pharmacy contact our office and allow 72 hours for refills to be completed.    Today you received the following chemotherapy and/or immunotherapy agents lanreotide.      To help prevent nausea and vomiting after your treatment, we encourage you to take your nausea medication as directed.  BELOW ARE SYMPTOMS THAT SHOULD BE REPORTED IMMEDIATELY: *FEVER GREATER THAN 100.4 F (38 C) OR HIGHER *CHILLS OR SWEATING *NAUSEA AND VOMITING THAT IS NOT CONTROLLED WITH YOUR NAUSEA MEDICATION *UNUSUAL SHORTNESS OF BREATH *UNUSUAL BRUISING OR BLEEDING *URINARY PROBLEMS (pain or burning when urinating, or frequent urination) *BOWEL PROBLEMS (unusual diarrhea, constipation, pain near the anus) TENDERNESS IN MOUTH AND THROAT WITH OR WITHOUT PRESENCE OF ULCERS (sore throat, sores in mouth, or a toothache) UNUSUAL RASH, SWELLING OR PAIN  UNUSUAL VAGINAL DISCHARGE OR ITCHING   Items with * indicate a potential emergency and should be followed up as soon as possible or go to the Emergency Department if any problems should occur.  Please show the CHEMOTHERAPY ALERT CARD or IMMUNOTHERAPY ALERT CARD at  check-in to the Emergency Department and triage nurse.  Should you have questions after your visit or need to cancel or reschedule your appointment, please contact [facility]  Dept: [phone]  and follow the prompts.  Office hours are 8:00 a.m. to 4:30 p.m. Monday - Friday. Please note that voicemails left after 4:00 p.m. may not be returned until the following business day.  We are closed weekends and major holidays. You have access to a nurse at all times for urgent questions. Please call the main number to the clinic Dept: [phone] and follow the prompts.   For any non-urgent questions, you may also contact your provider using MyChart. We now offer e-Visits for anyone 44 and older to request care online for non-urgent symptoms. For details visit [email or web address].   Also download the MyChart app! Go to the app store, search "MyChart", open the app, select [place], and log in with your MyChart username and password.  Lanreotide Injection What is this medication? LANREOTIDE (lan REE oh tide) treats high levels of growth hormone (acromegaly). It is used when other therapies have not worked well enough or cannot be tolerated. It works by reducing the amount of growth hormone your body makes. This reduces symptoms and the risk of health problems caused by too much growth hormone, such as diabetes and heart disease. It may also be used to treat neuroendocrine tumors, a cancer of the cells that release hormones and other substances in your body. It works by slowing down the release of these substances from the cells. This slows tumor growth. It  also decreases the symptoms of carcinoid syndrome, such as flushing or diarrhea. This medicine may be used for other purposes; ask your health care provider or pharmacist if you have questions. COMMON BRAND NAME(S): Somatuline Depot What should I tell my care team before I take this medication? They need to know  if you have any of these conditions: Diabetes Gallbladder disease Heart disease Kidney disease Liver disease Thyroid disease An unusual or allergic reaction to lanreotide, other medications, foods, dyes, or preservatives Pregnant or trying to get pregnant Breast-feeding How should I use this medication? This medication is injected under the skin. It is given by your care team in a hospital or clinic setting. Talk to your care team about the use of this medication in children. Special care may be needed. Overdosage: If you think you have taken too much of this medicine contact a poison control center or emergency room at once. NOTE: This medicine is only for you. Do not share this medicine with others. What if I miss a dose? Keep appointments for follow-up doses. It is important not to miss your dose. Call your care team if you are unable to keep an appointment. What may interact with this medication? Bromocriptine Cyclosporine Certain medications for blood pressure, heart disease, irregular heartbeat Certain medications for diabetes Quinidine Terfenadine This list may not describe all possible interactions. Give your health care provider a list of all the medicines, herbs, non-prescription drugs, or dietary supplements you use. Also tell them if you smoke, drink alcohol, or use illegal drugs. Some items may interact with your medicine. What should I watch for while using this medication? Visit your care team for regular checks on your progress. Tell your care team if your symptoms do not start to get better or if they get worse. Your condition will be monitored carefully while you are receiving this medication. You may need blood work while you are taking this medication. This medication may increase blood sugar. The risk may be higher in patients who already have diabetes. Ask your care team what you can do to lower your risk of diabetes while taking this medication. Talk to your care  team if you wish to become pregnant or think you may be pregnant. This medication can cause serious birth defects. Do not breast-feed while taking this medication and for 6 months after stopping therapy. This medication may cause infertility. Talk to your care team if you are concerned about your fertility. What side effects may I notice from receiving this medication? Side effects that you should report to your care team as soon as possible: Allergic reactions--skin rash, itching, hives, swelling of the face, lips, tongue, or throat Gallbladder problems--severe stomach pain, nausea, vomiting, fever High blood sugar (hyperglycemia)--increased thirst or amount of urine, unusual weakness or fatigue, blurry vision Increase in blood pressure Low blood sugar (hypoglycemia)--tremors or shaking, anxiety, sweating, cold or clammy skin, confusion, dizziness, rapid heartbeat Low thyroid levels (hypothyroidism)--unusual weakness or fatigue, increased sensitivity to cold, constipation, hair loss, dry skin, weight gain, feelings of depression Slow heartbeat--dizziness, feeling faint or lightheaded, confusion, trouble breathing, unusual weakness or fatigue Side effects that usually do not require medical attention (report to your care team if they continue or are bothersome): Diarrhea Dizziness Headache Muscle spasms Nausea Pain, redness, irritation, or bruising at the injection site Stomach pain This list may not describe all possible side effects. Call your doctor for medical advice about side effects. You may report side effects to FDA at 1-800-FDA-1088. Where  should I keep my medication? This medication is given in a hospital or clinic. It will not be stored at home. NOTE: This sheet is a summary. It may not cover all possible information. If you have questions about this medicine, talk to your doctor, pharmacist, or health care provider.  2024 Elsevier/Gold Standard (2021-05-20 00:00:00)

## 2022-10-05 ENCOUNTER — Inpatient Hospital Stay: Payer: No Typology Code available for payment source | Attending: Nurse Practitioner

## 2022-10-05 VITALS — BP 99/68 | HR 70 | Temp 97.8°F | Resp 17 | Ht 62.0 in | Wt 161.2 lb

## 2022-10-05 DIAGNOSIS — C7A098 Malignant carcinoid tumors of other sites: Secondary | ICD-10-CM | POA: Insufficient documentation

## 2022-10-05 DIAGNOSIS — Z79899 Other long term (current) drug therapy: Secondary | ICD-10-CM | POA: Diagnosis not present

## 2022-10-05 DIAGNOSIS — C7A8 Other malignant neuroendocrine tumors: Secondary | ICD-10-CM

## 2022-10-05 MED ORDER — LANREOTIDE ACETATE 120 MG/0.5ML ~~LOC~~ SOLN
120.0000 mg | Freq: Once | SUBCUTANEOUS | Status: AC
  Administered 2022-10-05: 120 mg via SUBCUTANEOUS
  Filled 2022-10-05: qty 120

## 2022-10-05 NOTE — Patient Instructions (Signed)
[facility]   Discharge Instructions: Thank you for choosing [facility] to provide your oncology and hematology care.   If you have a lab appointment with the Cancer Center, please go directly to the Cancer Center and check in at the registration area.   Wear comfortable clothing and clothing appropriate for easy access to any Portacath or PICC line.   We strive to give you quality time with your provider. You may need to reschedule your appointment if you arrive late (15 or more minutes).  Arriving late affects you and other patients whose appointments are after yours.  Also, if you miss three or more appointments without notifying the office, you may be dismissed from the clinic at the provider's discretion.      For prescription refill requests, have your pharmacy contact our office and allow 72 hours for refills to be completed.    Today you received the following chemotherapy and/or immunotherapy agents Lanreotide.      To help prevent nausea and vomiting after your treatment, we encourage you to take your nausea medication as directed.  BELOW ARE SYMPTOMS THAT SHOULD BE REPORTED IMMEDIATELY: *FEVER GREATER THAN 100.4 F (38 C) OR HIGHER *CHILLS OR SWEATING *NAUSEA AND VOMITING THAT IS NOT CONTROLLED WITH YOUR NAUSEA MEDICATION *UNUSUAL SHORTNESS OF BREATH *UNUSUAL BRUISING OR BLEEDING *URINARY PROBLEMS (pain or burning when urinating, or frequent urination) *BOWEL PROBLEMS (unusual diarrhea, constipation, pain near the anus) TENDERNESS IN MOUTH AND THROAT WITH OR WITHOUT PRESENCE OF ULCERS (sore throat, sores in mouth, or a toothache) UNUSUAL RASH, SWELLING OR PAIN  UNUSUAL VAGINAL DISCHARGE OR ITCHING   Items with * indicate a potential emergency and should be followed up as soon as possible or go to the Emergency Department if any problems should occur.  Please show the CHEMOTHERAPY ALERT CARD or IMMUNOTHERAPY ALERT CARD at  check-in to the Emergency Department and triage nurse.  Should you have questions after your visit or need to cancel or reschedule your appointment, please contact [facility]  Dept: [phone]  and follow the prompts.  Office hours are 8:00 a.m. to 4:30 p.m. Monday - Friday. Please note that voicemails left after 4:00 p.m. may not be returned until the following business day.  We are closed weekends and major holidays. You have access to a nurse at all times for urgent questions. Please call the main number to the clinic Dept: [phone] and follow the prompts.   For any non-urgent questions, you may also contact your provider using MyChart. We now offer e-Visits for anyone 18 and older to request care online for non-urgent symptoms. For details visit [email or web address].   Also download the MyChart app! Go to the app store, search "MyChart", open the app, select , and log in with your MyChart username and password.  Lanreotide Injection What is this medication? LANREOTIDE (lan REE oh tide) treats high levels of growth hormone (acromegaly). It is used when other therapies have not worked well enough or cannot be tolerated. It works by reducing the amount of growth hormone your body makes. This reduces symptoms and the risk of health problems caused by too much growth hormone, such as diabetes and heart disease. It may also be used to treat neuroendocrine tumors, a cancer of the cells that release hormones and other substances in your body. It works by slowing down the release of these substances from the cells. This slows tumor growth. It   also decreases the symptoms of carcinoid syndrome, such as flushing or diarrhea. This medicine may be used for other purposes; ask your health care provider or pharmacist if you have questions. COMMON BRAND NAME(S): Somatuline Depot What should I tell my care team before I take this medication? They need to know  if you have any of these conditions: Diabetes Gallbladder disease Heart disease Kidney disease Liver disease Thyroid disease An unusual or allergic reaction to lanreotide, other medications, foods, dyes, or preservatives Pregnant or trying to get pregnant Breast-feeding How should I use this medication? This medication is injected under the skin. It is given by your care team in a hospital or clinic setting. Talk to your care team about the use of this medication in children. Special care may be needed. Overdosage: If you think you have taken too much of this medicine contact a poison control center or emergency room at once. NOTE: This medicine is only for you. Do not share this medicine with others. What if I miss a dose? Keep appointments for follow-up doses. It is important not to miss your dose. Call your care team if you are unable to keep an appointment. What may interact with this medication? Bromocriptine Cyclosporine Certain medications for blood pressure, heart disease, irregular heartbeat Certain medications for diabetes Quinidine Terfenadine This list may not describe all possible interactions. Give your health care provider a list of all the medicines, herbs, non-prescription drugs, or dietary supplements you use. Also tell them if you smoke, drink alcohol, or use illegal drugs. Some items may interact with your medicine. What should I watch for while using this medication? Visit your care team for regular checks on your progress. Tell your care team if your symptoms do not start to get better or if they get worse. Your condition will be monitored carefully while you are receiving this medication. You may need blood work while you are taking this medication. This medication may increase blood sugar. The risk may be higher in patients who already have diabetes. Ask your care team what you can do to lower your risk of diabetes while taking this medication. Talk to your care  team if you wish to become pregnant or think you may be pregnant. This medication can cause serious birth defects. Do not breast-feed while taking this medication and for 6 months after stopping therapy. This medication may cause infertility. Talk to your care team if you are concerned about your fertility. What side effects may I notice from receiving this medication? Side effects that you should report to your care team as soon as possible: Allergic reactions--skin rash, itching, hives, swelling of the face, lips, tongue, or throat Gallbladder problems--severe stomach pain, nausea, vomiting, fever High blood sugar (hyperglycemia)--increased thirst or amount of urine, unusual weakness or fatigue, blurry vision Increase in blood pressure Low blood sugar (hypoglycemia)--tremors or shaking, anxiety, sweating, cold or clammy skin, confusion, dizziness, rapid heartbeat Low thyroid levels (hypothyroidism)--unusual weakness or fatigue, increased sensitivity to cold, constipation, hair loss, dry skin, weight gain, feelings of depression Slow heartbeat--dizziness, feeling faint or lightheaded, confusion, trouble breathing, unusual weakness or fatigue Side effects that usually do not require medical attention (report to your care team if they continue or are bothersome): Diarrhea Dizziness Headache Muscle spasms Nausea Pain, redness, irritation, or bruising at the injection site Stomach pain This list may not describe all possible side effects. Call your doctor for medical advice about side effects. You may report side effects to FDA at 1-800-FDA-1088. Where   should I keep my medication? This medication is given in a hospital or clinic. It will not be stored at home. NOTE: This sheet is a summary. It may not cover all possible information. If you have questions about this medicine, talk to your doctor, pharmacist, or health care provider.  2024 Elsevier/Gold Standard (2021-05-20 00:00:00)   

## 2022-10-29 ENCOUNTER — Other Ambulatory Visit: Payer: Self-pay | Admitting: Physician Assistant

## 2022-10-29 DIAGNOSIS — N6459 Other signs and symptoms in breast: Secondary | ICD-10-CM

## 2022-10-29 DIAGNOSIS — Z86000 Personal history of in-situ neoplasm of breast: Secondary | ICD-10-CM

## 2022-11-02 ENCOUNTER — Inpatient Hospital Stay: Payer: No Typology Code available for payment source

## 2022-11-02 ENCOUNTER — Inpatient Hospital Stay: Payer: No Typology Code available for payment source | Attending: Nurse Practitioner

## 2022-11-02 ENCOUNTER — Inpatient Hospital Stay (HOSPITAL_BASED_OUTPATIENT_CLINIC_OR_DEPARTMENT_OTHER): Payer: No Typology Code available for payment source | Admitting: Oncology

## 2022-11-02 VITALS — BP 103/72 | HR 72 | Temp 97.9°F | Resp 18 | Ht 62.0 in | Wt 157.2 lb

## 2022-11-02 DIAGNOSIS — R21 Rash and other nonspecific skin eruption: Secondary | ICD-10-CM | POA: Diagnosis not present

## 2022-11-02 DIAGNOSIS — R112 Nausea with vomiting, unspecified: Secondary | ICD-10-CM | POA: Insufficient documentation

## 2022-11-02 DIAGNOSIS — C7A8 Other malignant neuroendocrine tumors: Secondary | ICD-10-CM | POA: Diagnosis not present

## 2022-11-02 DIAGNOSIS — Z79899 Other long term (current) drug therapy: Secondary | ICD-10-CM | POA: Diagnosis not present

## 2022-11-02 DIAGNOSIS — C7B8 Other secondary neuroendocrine tumors: Secondary | ICD-10-CM

## 2022-11-02 DIAGNOSIS — C7A098 Malignant carcinoid tumors of other sites: Secondary | ICD-10-CM | POA: Insufficient documentation

## 2022-11-02 DIAGNOSIS — E7800 Pure hypercholesterolemia, unspecified: Secondary | ICD-10-CM | POA: Insufficient documentation

## 2022-11-02 LAB — CMP (CANCER CENTER ONLY)
ALT: 35 U/L (ref 0–44)
AST: 32 U/L (ref 15–41)
Albumin: 4.4 g/dL (ref 3.5–5.0)
Alkaline Phosphatase: 118 U/L (ref 38–126)
Anion gap: 6 (ref 5–15)
BUN: 10 mg/dL (ref 6–20)
CO2: 30 mmol/L (ref 22–32)
Calcium: 9.8 mg/dL (ref 8.9–10.3)
Chloride: 105 mmol/L (ref 98–111)
Creatinine: 0.84 mg/dL (ref 0.44–1.00)
GFR, Estimated: >60 mL/min (ref >60)
Glucose, Bld: 122 mg/dL — ABNORMAL HIGH (ref 70–99)
Potassium: 4.6 mmol/L (ref 3.5–5.1)
Sodium: 141 mmol/L (ref 135–145)
Total Bilirubin: 0.6 mg/dL (ref 0.3–1.2)
Total Protein: 7.3 g/dL (ref 6.5–8.1)

## 2022-11-02 MED ORDER — LANREOTIDE ACETATE 120 MG/0.5ML ~~LOC~~ SOLN
120.0000 mg | Freq: Once | SUBCUTANEOUS | Status: AC
  Administered 2022-11-02: 120 mg via SUBCUTANEOUS
  Filled 2022-11-02: qty 120

## 2022-11-02 NOTE — Progress Notes (Signed)
[facility] OFFICE PROGRESS NOTE   Diagnosis: Carcinoid tumor  INTERVAL HISTORY:   Sally Brown is as scheduled.  She feels well.  She reports intentional weight loss.  She is taking Ozempic.  No diarrhea or flushing.  She developed a "rash "over the right nipple that improved with hydrocortisone cream.  Objective:  Vital signs in last 24 hours:  Blood pressure 103/72, pulse 72, temperature 97.9 F (36.6 C), temperature source Temporal, resp. rate 18, height 5\' 2"  (1.575 m), weight 157 lb 3.2 oz (71.3 kg), last menstrual period 05/17/2017, SpO2 99%.     Resp: Lungs clear bilaterally Cardio: Regular rate and rhythm GI: Nontender, no mass, no hepatosplenomegaly Vascular: No leg edema Breast: Exam declined by patient     Lab Results:  Lab Results  Component Value Date   WBC 6.0 04/14/2021   HGB 12.7 04/14/2021   HCT 38.0 04/14/2021   MCV 91.8 04/14/2021   PLT 237 04/14/2021   NEUTROABS 3.6 04/14/2021    CMP  Lab Results  Component Value Date   NA 141 03/23/2022   K 4.5 03/23/2022   CL 105 03/23/2022   CO2 30 03/23/2022   GLUCOSE 132 (H) 03/23/2022   BUN 18 03/23/2022   CREATININE 0.84 03/23/2022   CALCIUM 9.9 03/23/2022   PROT 7.2 03/23/2022   ALBUMIN 4.3 03/23/2022   AST 24 03/23/2022   ALT 30 03/23/2022   ALKPHOS 94 03/23/2022   BILITOT 0.5 03/23/2022   GFRNONAA >60 03/23/2022   GFRAA >60 10/08/2019    No results found for: "CEA1", "CEA", "CAN199", "CA125"  Lab Results  Component Value Date   INR 1.1 06/21/2018   LABPROT 14.3 06/21/2018    Imaging:  No results found.  Medications: I have reviewed the patient's current medications.   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   06/07/2018 MRI of the liver-numerous hepatic lesions scattered throughout all aspects of the liver.  06/14/2018 PET scan-enlarged and hypermetabolic perihepatic and portacaval lymph  nodes; diffuse heterogeneous uptake within the liver with subtle areas of mildly increased FDG uptake corresponding to suspicious lesions identified on MRI.   06/21/2018 biopsy of a right liver lobe mass-low-grade neuroendocrine tumor consistent with metastasis, positive CD56, chromogranin, synaptophysin, CDX-2 and cytokeratin AE1/AE3. 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  Single focus of uptake in the proximal right humerus.  No clear primary tumor identified. Normal chromogranin A 07/05/2018 and normal 24-hour urine 5 HIAA 08/03/2018 Monthly lanreotide beginning 08/03/2018 CT 12/15/2018-small hepatic metastases, mildly improved, slight increase in size of a porta hepatis node CT abdomen/pelvis 07/02/2019-some of the liver lesions are slightly larger, others stable or smaller, slight increase in splenic lesions, minimal increase in abdominal adenopathy Cycle 1 Lutathera 08/14/2019 Cycle 2 Lutathera 10/08/2019 Cycle 3 Lutathera 12/03/2019 Cycle 4 Lutathera 01/29/2020 Netspot 03/11/2020-marked reduction in activity within hepatic metastases with only 1 remaining measurable lesion, decrease in size and radiotracer activity involving periportal and peripancreatic lymph nodes, decreased radiotracer activity at a subcarinal node and resolution of activity at a left supraclavicular node, resolution of focal metabolic activity in the right humerus, near complete resolution of activity in the left T3 transverse process, decreased activity involving a right acetabular lesion, no evidence of new or progressive disease Monthly lanreotide continued Netspot 12/22/2020 (outside facility)-subjective stability of disease-very small left hepatic lobe and caudate metastatic lesions.  Additional hepatic metastatic lesions not completely excluded.   Metastatic hepatic hilar/portacaval node.  Low suspicion subcarinal lymph node. EUS 01/08/2021-suspicion of Barrett's esophagus 35 cm from the incisors,, no gross lesion in the duodenum or visualized jejunum, no significant pathology in the pancreas, common bile duct, no malignant appearing lymph nodes in the celiac, peripancreatic, porta hepatis region Netspot 12/28/2021-2 radiotracer avid lesions remain in the liver, small with activity minimally above background, no evidence of disease progression.  No evidence of neuroendocrine tumor elsewhere including mediastinal lymph nodes, periportal lymph nodes, and skeletal metastases 3.   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 Dwain Sarna Bilateral breast MRI 11/11/2020-no MRI evidence for malignancy in either breast. Bilateral mammogram 06/15/2021-no evidence of malignancy. Bilateral breast MRI 12/07/2021-no evidence of malignancy, 1 cm enhancing mass at the left liver unchanged-compatible with known neuroendocrine metastasis 4.   Hypercholesterolemia 5.   Nausea and vomiting following cycle 1 Lutathera        Disposition: Sally Brown appears stable.  She continues monthly lanreotide.  She will be scheduled for a restaging Netspot in December.  She will return for an office visit following the Netspot study.  She continues tamoxifen for the LCIS and is followed by Dr. Dwain Sarna.  Thornton Papas, MD  11/02/2022  8:11 AM

## 2022-11-03 LAB — CHROMOGRANIN A: Chromogranin A (ng/mL): 112.2 ng/mL — ABNORMAL HIGH (ref 0.0–101.8)

## 2022-11-04 ENCOUNTER — Telehealth: Payer: Self-pay | Admitting: *Deleted

## 2022-11-04 ENCOUNTER — Other Ambulatory Visit: Payer: Self-pay | Admitting: *Deleted

## 2022-11-04 DIAGNOSIS — C7B8 Other secondary neuroendocrine tumors: Secondary | ICD-10-CM

## 2022-11-04 NOTE — Telephone Encounter (Signed)
LVM to check Mychart for MD thoughts on chromogranin test result.

## 2022-11-04 NOTE — Telephone Encounter (Signed)
-----   Message from Thornton Papas sent at 11/03/2022  8:18 PM EDT ----- Please call patient, chromogranin a is very mildly elevated, could be related to prilosecrepeat next visit, hold prilosec  7 days prior to lab

## 2022-11-18 ENCOUNTER — Telehealth: Payer: Self-pay | Admitting: *Deleted

## 2022-11-18 NOTE — Telephone Encounter (Signed)
Notified Sally Brown of NM PET DOTATATE for 12/29/22 w/f/u on 12/19. She is aware of prep

## 2022-11-18 NOTE — Telephone Encounter (Signed)
NM PET Dotatate scheduled for 12/29/22 (due to last lanreotide on 11/19) at 1130/1200. NPO except water 2 hours prior.

## 2022-11-30 ENCOUNTER — Other Ambulatory Visit: Payer: No Typology Code available for payment source

## 2022-11-30 ENCOUNTER — Inpatient Hospital Stay: Payer: No Typology Code available for payment source | Attending: Nurse Practitioner

## 2022-11-30 VITALS — BP 102/65 | HR 72 | Temp 97.6°F | Resp 18 | Ht 62.0 in | Wt 152.3 lb

## 2022-11-30 DIAGNOSIS — Z79899 Other long term (current) drug therapy: Secondary | ICD-10-CM | POA: Insufficient documentation

## 2022-11-30 DIAGNOSIS — C7A098 Malignant carcinoid tumors of other sites: Secondary | ICD-10-CM | POA: Insufficient documentation

## 2022-11-30 DIAGNOSIS — C7B8 Other secondary neuroendocrine tumors: Secondary | ICD-10-CM

## 2022-11-30 MED ORDER — LANREOTIDE ACETATE 120 MG/0.5ML ~~LOC~~ SOLN
120.0000 mg | Freq: Once | SUBCUTANEOUS | Status: AC
Start: 2022-11-30 — End: 2022-11-30
  Administered 2022-11-30: 120 mg via SUBCUTANEOUS
  Filled 2022-11-30: qty 120

## 2022-11-30 NOTE — Patient Instructions (Signed)
 [facility] - A DEPT OF [facility]  Discharge Instructions: Thank you for choosing [facility] to provide your oncology and hematology care.   If you have a lab appointment with the Cancer Center, please go directly to the Cancer Center and check in at the registration area.   Wear comfortable clothing and clothing appropriate for easy access to any Portacath or PICC line.   We strive to give you quality time with your provider. You may need to reschedule your appointment if you arrive late (15 or more minutes).  Arriving late affects you and other patients whose appointments are after yours.  Also, if you miss three or more appointments without notifying the office, you may be dismissed from the clinic at the provider's discretion.      For prescription refill requests, have your pharmacy contact our office and allow 72 hours for refills to be completed.    Today you received the following chemotherapy and/or immunotherapy agents Lanreotide      To help prevent nausea and vomiting after your treatment, we encourage you to take your nausea medication as directed.  BELOW ARE SYMPTOMS THAT SHOULD BE REPORTED IMMEDIATELY: *FEVER GREATER THAN 100.4 F (38 C) OR HIGHER *CHILLS OR SWEATING *NAUSEA AND VOMITING THAT IS NOT CONTROLLED WITH YOUR NAUSEA MEDICATION *UNUSUAL SHORTNESS OF BREATH *UNUSUAL BRUISING OR BLEEDING *URINARY PROBLEMS (pain or burning when urinating, or frequent urination) *BOWEL PROBLEMS (unusual diarrhea, constipation, pain near the anus) TENDERNESS IN MOUTH AND THROAT WITH OR WITHOUT PRESENCE OF ULCERS (sore throat, sores in mouth, or a toothache) UNUSUAL RASH, SWELLING OR PAIN  UNUSUAL VAGINAL DISCHARGE OR ITCHING   Items with * indicate a potential emergency and should be followed up as soon as possible or go to the Emergency Department if any problems should occur.  Please show the CHEMOTHERAPY ALERT CARD or IMMUNOTHERAPY  ALERT CARD at check-in to the Emergency Department and triage nurse.  Should you have questions after your visit or need to cancel or reschedule your appointment, please contact [facility] - A DEPT OF [facility]  Dept: [phone]  and follow the prompts.  Office hours are 8:00 a.m. to 4:30 p.m. Monday - Friday. Please note that voicemails left after 4:00 p.m. may not be returned until the following business day.  We are closed weekends and major holidays. You have access to a nurse at all times for urgent questions. Please call the main number to the clinic Dept: [phone] and follow the prompts.   For any non-urgent questions, you may also contact your provider using MyChart. We now offer e-Visits for anyone 35 and older to request care online for non-urgent symptoms. For details visit [email or web address].   Also download the MyChart app! Go to the app store, search "MyChart", open the app, select [facility], and log in with your MyChart username and password.  Lanreotide Injection What is this medication? LANREOTIDE (lan REE oh tide) treats high levels of growth hormone (acromegaly). It is used when other therapies have not worked well enough or cannot be tolerated. It works by reducing the amount of growth hormone your body makes. This reduces symptoms and the risk of health problems caused by too much growth hormone, such as diabetes and heart disease. It may also be used to treat neuroendocrine tumors, a cancer of the cells that release hormones and other substances in your body. It works by slowing down the release  of these substances from the cells. This slows tumor growth. It also decreases the symptoms of carcinoid syndrome, such as flushing or diarrhea. This medicine may be used for other purposes; ask your health care provider or pharmacist if you have questions. COMMON BRAND NAME(S): Somatuline Depot What should I tell my care team before I take  this medication? They need to know if you have any of these conditions: Diabetes Gallbladder disease Heart disease Kidney disease Liver disease Thyroid disease An unusual or allergic reaction to lanreotide, other medications, foods, dyes, or preservatives Pregnant or trying to get pregnant Breast-feeding How should I use this medication? This medication is injected under the skin. It is given by your care team in a hospital or clinic setting. Talk to your care team about the use of this medication in children. Special care may be needed. Overdosage: If you think you have taken too much of this medicine contact a poison control center or emergency room at once. NOTE: This medicine is only for you. Do not share this medicine with others. What if I miss a dose? Keep appointments for follow-up doses. It is important not to miss your dose. Call your care team if you are unable to keep an appointment. What may interact with this medication? Bromocriptine Cyclosporine Certain medications for blood pressure, heart disease, irregular heartbeat Certain medications for diabetes Quinidine Terfenadine This list may not describe all possible interactions. Give your health care provider a list of all the medicines, herbs, non-prescription drugs, or dietary supplements you use. Also tell them if you smoke, drink alcohol, or use illegal drugs. Some items may interact with your medicine. What should I watch for while using this medication? Visit your care team for regular checks on your progress. Tell your care team if your symptoms do not start to get better or if they get worse. Your condition will be monitored carefully while you are receiving this medication. You may need blood work while you are taking this medication. This medication may increase blood sugar. The risk may be higher in patients who already have diabetes. Ask your care team what you can do to lower your risk of diabetes while taking  this medication. Talk to your care team if you wish to become pregnant or think you may be pregnant. This medication can cause serious birth defects. Do not breast-feed while taking this medication and for 6 months after stopping therapy. This medication may cause infertility. Talk to your care team if you are concerned about your fertility. What side effects may I notice from receiving this medication? Side effects that you should report to your care team as soon as possible: Allergic reactions--skin rash, itching, hives, swelling of the face, lips, tongue, or throat Gallbladder problems--severe stomach pain, nausea, vomiting, fever High blood sugar (hyperglycemia)--increased thirst or amount of urine, unusual weakness or fatigue, blurry vision Increase in blood pressure Low blood sugar (hypoglycemia)--tremors or shaking, anxiety, sweating, cold or clammy skin, confusion, dizziness, rapid heartbeat Low thyroid levels (hypothyroidism)--unusual weakness or fatigue, increased sensitivity to cold, constipation, hair loss, dry skin, weight gain, feelings of depression Slow heartbeat--dizziness, feeling faint or lightheaded, confusion, trouble breathing, unusual weakness or fatigue Side effects that usually do not require medical attention (report to your care team if they continue or are bothersome): Diarrhea Dizziness Headache Muscle spasms Nausea Pain, redness, irritation, or bruising at the injection site Stomach pain This list may not describe all possible side effects. Call your doctor for medical advice about side  effects. You may report side effects to FDA at 1-800-FDA-1088. Where should I keep my medication? This medication is given in a hospital or clinic. It will not be stored at home. NOTE: This sheet is a summary. It may not cover all possible information. If you have questions about this medicine, talk to your doctor, pharmacist, or health care provider.  2024 Elsevier/Gold  Standard (2021-05-20 00:00:00)

## 2022-12-05 ENCOUNTER — Other Ambulatory Visit: Payer: No Typology Code available for payment source

## 2022-12-24 ENCOUNTER — Encounter: Payer: Self-pay | Admitting: Physician Assistant

## 2022-12-28 ENCOUNTER — Other Ambulatory Visit: Payer: No Typology Code available for payment source

## 2022-12-28 ENCOUNTER — Ambulatory Visit: Payer: No Typology Code available for payment source

## 2022-12-28 ENCOUNTER — Other Ambulatory Visit: Payer: No Typology Code available for payment source | Admitting: Oncology

## 2022-12-29 ENCOUNTER — Encounter (HOSPITAL_COMMUNITY)
Admission: RE | Admit: 2022-12-29 | Discharge: 2022-12-29 | Disposition: A | Discharge status: Home / Self Care | Payer: No Typology Code available for payment source | Source: Ambulatory Visit | Attending: Oncology | Admitting: Oncology

## 2022-12-29 DIAGNOSIS — C7B8 Other secondary neuroendocrine tumors: Secondary | ICD-10-CM | POA: Insufficient documentation

## 2022-12-29 DIAGNOSIS — C7A8 Other malignant neuroendocrine tumors: Secondary | ICD-10-CM | POA: Diagnosis present

## 2022-12-29 MED ORDER — COPPER CU 64 DOTATATE 1 MCI/ML IV SOLN
4.0000 | Freq: Once | INTRAVENOUS | Status: AC
  Administered 2022-12-29: 4.12 via INTRAVENOUS

## 2022-12-30 ENCOUNTER — Other Ambulatory Visit: Payer: No Typology Code available for payment source

## 2022-12-30 ENCOUNTER — Inpatient Hospital Stay: Payer: No Typology Code available for payment source

## 2022-12-30 ENCOUNTER — Inpatient Hospital Stay: Payer: No Typology Code available for payment source | Attending: Nurse Practitioner | Admitting: Oncology

## 2022-12-30 VITALS — BP 103/76 | HR 79 | Temp 98.2°F | Resp 18 | Ht 62.0 in | Wt 151.5 lb

## 2022-12-30 DIAGNOSIS — C7B8 Other secondary neuroendocrine tumors: Secondary | ICD-10-CM | POA: Diagnosis not present

## 2022-12-30 DIAGNOSIS — Z79899 Other long term (current) drug therapy: Secondary | ICD-10-CM | POA: Diagnosis not present

## 2022-12-30 DIAGNOSIS — C7A8 Other malignant neuroendocrine tumors: Secondary | ICD-10-CM

## 2022-12-30 DIAGNOSIS — C7A098 Malignant carcinoid tumors of other sites: Secondary | ICD-10-CM | POA: Insufficient documentation

## 2022-12-30 MED ORDER — LANREOTIDE ACETATE 120 MG/0.5ML ~~LOC~~ SOLN
120.0000 mg | Freq: Once | SUBCUTANEOUS | Status: AC
Start: 2022-12-30 — End: 2022-12-30
  Administered 2022-12-30: 120 mg via SUBCUTANEOUS
  Filled 2022-12-30: qty 120

## 2022-12-30 NOTE — Progress Notes (Signed)
[facility] OFFICE PROGRESS NOTE   Diagnosis: Carcinoid tumor  INTERVAL HISTORY:   Sally Brown returns as scheduled.  She continues lanreotide.  No flushing or diarrhea.  She feels well.  No complaint.  Her appetite has diminished since beginning Ozempic.  No discomfort at the right hip.  Objective:  Vital signs in last 24 hours:  Blood pressure 103/76, pulse 79, temperature 98.2 F (36.8 C), temperature source Temporal, resp. rate 18, height 5\' 2"  (1.575 m), weight 151 lb 8 oz (68.7 kg), last menstrual period 05/17/2017, SpO2 99%.    Lymphatics: No cervical, supraclavicular, axillary, or inguinal nodes Resp: Lungs clear bilaterally Cardio: Regular rate and rhythm GI: No hepatosplenomegaly, no mass, nontender Vascular: No leg edema      Lab Results:  Lab Results  Component Value Date   WBC 6.0 04/14/2021   HGB 12.7 04/14/2021   HCT 38.0 04/14/2021   MCV 91.8 04/14/2021   PLT 237 04/14/2021   NEUTROABS 3.6 04/14/2021    CMP  Lab Results  Component Value Date   NA 141 11/02/2022   K 4.6 11/02/2022   CL 105 11/02/2022   CO2 30 11/02/2022   GLUCOSE 122 (H) 11/02/2022   BUN 10 11/02/2022   CREATININE 0.84 11/02/2022   CALCIUM 9.8 11/02/2022   PROT 7.3 11/02/2022   ALBUMIN 4.4 11/02/2022   AST 32 11/02/2022   ALT 35 11/02/2022   ALKPHOS 118 11/02/2022   BILITOT 0.6 11/02/2022   GFRNONAA >60 11/02/2022   GFRAA >60 10/08/2019     Lab Results  Component Value Date   INR 1.1 06/21/2018   LABPROT 14.3 06/21/2018    Imaging:  NM PET DOTATATE SKULL BASE TO MID THIGH Result Date: 12/29/2022 CLINICAL DATA:  Subsequent treatment strategy for well differentiated neuroendocrine tumor. EXAM: NUCLEAR MEDICINE PET SKULL BASE TO THIGH TECHNIQUE: 4.1 mCi copper 64 DOTATATE was injected intravenously. Full-ring PET imaging was performed from the skull base to thigh after the radiotracer. CT data was obtained and used for attenuation correction and  anatomic localization. COMPARISON:  PET-CT 12/28/2021 FINDINGS: NECK No radiotracer activity in neck lymph nodes. Incidental CT findings: None CHEST There several new small but radiotracer avid lymph nodes in the mediastinum. For example 5 mm node anterior to the carina with SUV max equal 12.0 on image 59. A subcarinal node measuring 6 mm with SUV max equal 18. Incidental CT finding:No suspicious pulmonary nodules on the CT portion exam. ABDOMEN/PELVIS Again demonstrated radiotracer avid lesion in the LEFT hepatic lobe with SUV max equal 24 compared SUV max equal 14 (image 83). There is a second lesion immediately adjacent with similar radiotracer activity on image 83. New is radiotracer avid upper abdominal lymph nodes at the level of the diaphragmatic crus. Again small nodes with intense activity (SUV max equal 26) on image 86. tiny node radiotracer avid adjacent to the RIGHT adrenal on image 101. Physiologic activity noted in the liver, spleen, adrenal glands and kidneys. Incidental CT findings: No abnormal radiotracer activity associated with the bowel.  Is SKELETON Faint radiotracer activity the posterior aspect of the RIGHT acetabulum SUV max equal 4.2 on image 169. No CT findings CT Incidental CT findings:None IMPRESSION: 1. New small radiotracer avid mediastinal and upper abdominal lymph nodes consistent with progression of well differentiated neuroendocrine tumor. 2. Stable dominant lesion LEFT hepatic lobe consistent with metastatic neuroendocrine tumor. New lesion in LEFT hepatic lobe adjacent to the previous described lesion consistent mild progression liver metastasis 3. Faint  radiotracer activity in the posterior aspect of the RIGHT acetabulum indeterminate no CT findings to suggest osseous metastasis. Recommend attention on follow-up. Electronically Signed   By: Genevive Bi M.D.   On: 12/29/2022 14:07    Medications: I have reviewed the patient's current  medications.   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   06/07/2018 MRI of the liver-numerous hepatic lesions scattered throughout all aspects of the liver.  06/14/2018 PET scan-enlarged and hypermetabolic perihepatic and portacaval lymph nodes; diffuse heterogeneous uptake within the liver with subtle areas of mildly increased FDG uptake corresponding to suspicious lesions identified on MRI.   06/21/2018 biopsy of a right liver lobe mass-low-grade neuroendocrine tumor consistent with metastasis, positive CD56, chromogranin, synaptophysin, CDX-2 and cytokeratin AE1/AE3. 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  Single focus of uptake in the proximal right humerus.  No clear primary tumor identified. Normal chromogranin A 07/05/2018 and normal 24-hour urine 5 HIAA 08/03/2018 Monthly lanreotide beginning 08/03/2018 CT 12/15/2018-small hepatic metastases, mildly improved, slight increase in size of a porta hepatis node CT abdomen/pelvis 07/02/2019-some of the liver lesions are slightly larger, others stable or smaller, slight increase in splenic lesions, minimal increase in abdominal adenopathy Cycle 1 Lutathera 08/14/2019 Cycle 2 Lutathera 10/08/2019 Cycle 3 Lutathera 12/03/2019 Cycle 4 Lutathera 01/29/2020 Netspot 03/11/2020-marked reduction in activity within hepatic metastases with only 1 remaining measurable lesion, decrease in size and radiotracer activity involving periportal and peripancreatic lymph nodes, decreased radiotracer activity at a subcarinal node and resolution of activity at a left supraclavicular node, resolution of focal metabolic activity in the right humerus, near complete resolution of activity in the left T3 transverse process, decreased  activity involving a right acetabular lesion, no evidence of new or progressive disease Monthly lanreotide continued Netspot 12/22/2020 (outside facility)-subjective stability of disease-very small left hepatic lobe and caudate metastatic lesions.  Additional hepatic metastatic lesions not completely excluded.  Metastatic hepatic hilar/portacaval node.  Low suspicion subcarinal lymph node. EUS 01/08/2021-suspicion of Barrett's esophagus 35 cm from the incisors,, no gross lesion in the duodenum or visualized jejunum, no significant pathology in the pancreas, common bile duct, no malignant appearing lymph nodes in the celiac, peripancreatic, porta hepatis region Netspot 12/28/2021-2 radiotracer avid lesions remain in the liver, small with activity minimally above background, no evidence of disease progression.  No evidence of neuroendocrine tumor elsewhere including mediastinal lymph nodes, periportal lymph nodes, and skeletal metastases Netspot 12/29/2022: New small radiotracer avid mediastinal/upper abdominal lymph nodes, stable dominant left hepatic lobe lesion with a new left hepatic lesion, faint activity in the posterior right acetabulum-indeterminate 3.   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 Dwain Sarna Bilateral breast MRI 11/11/2020-no MRI evidence for malignancy in either breast. Bilateral mammogram 06/15/2021-no evidence of malignancy. Bilateral breast MRI 12/07/2021-no evidence of malignancy, 1 cm enhancing mass at the left liver unchanged-compatible with known neuroendocrine metastasis 4.   Hypercholesterolemia 5.   Nausea and vomiting following cycle 1 Lutathera    Disposition: Sally Brown appears stable.  We will follow-up on the chromogranin a level from today.  There is no clinical evidence for disease progression, but the dotatate PET reveals new small metastatic lymph nodes and a probable new left hepatic lesion.  The significance of the  radiotracer activity at the right acetabulum is unclear.  Reviewed the PET findings and images with her.  I will ask for clarification on the PET reading as she was noted to have right liver lesions the PET last year.  These lesions were not mentioned on the current study.  She will see Dr. Lattie Corns for a follow-up visit to discuss systemic treatment options.  Sally Brown will return for an office visit in 2 months.  Thornton Papas, MD  12/30/2022  8:52 AM

## 2023-01-01 ENCOUNTER — Inpatient Hospital Stay
Admission: RE | Admit: 2023-01-01 | Discharge: 2023-01-01 | Disposition: A | Discharge status: Home / Self Care | Payer: No Typology Code available for payment source | Source: Ambulatory Visit | Attending: Physician Assistant | Admitting: Physician Assistant

## 2023-01-01 DIAGNOSIS — Z86000 Personal history of in-situ neoplasm of breast: Secondary | ICD-10-CM

## 2023-01-01 DIAGNOSIS — N6459 Other signs and symptoms in breast: Secondary | ICD-10-CM

## 2023-01-01 MED ORDER — GADOPICLENOL 0.5 MMOL/ML IV SOLN
7.0000 mL | Freq: Once | INTRAVENOUS | Status: AC | PRN
  Administered 2023-01-01: 7 mL via INTRAVENOUS

## 2023-01-03 LAB — CHROMOGRANIN A: Chromogranin A (ng/mL): 76.3 ng/mL (ref 0.0–101.8)

## 2023-01-19 ENCOUNTER — Encounter: Payer: Self-pay | Admitting: *Deleted

## 2023-01-19 NOTE — Progress Notes (Signed)
 Per Dr. Truett Perna: Needs OV on day of next injection (1/16). Scheduling message sent. No lab

## 2023-01-27 ENCOUNTER — Inpatient Hospital Stay: Payer: No Typology Code available for payment source

## 2023-01-27 ENCOUNTER — Other Ambulatory Visit: Payer: Self-pay | Admitting: Oncology

## 2023-01-27 ENCOUNTER — Ambulatory Visit: Payer: No Typology Code available for payment source

## 2023-01-27 ENCOUNTER — Encounter: Payer: Self-pay | Admitting: *Deleted

## 2023-01-27 ENCOUNTER — Inpatient Hospital Stay: Payer: No Typology Code available for payment source | Attending: Nurse Practitioner | Admitting: Oncology

## 2023-01-27 VITALS — BP 95/69 | HR 81 | Temp 98.1°F | Resp 18 | Ht 62.0 in | Wt 148.9 lb

## 2023-01-27 DIAGNOSIS — C7A8 Other malignant neuroendocrine tumors: Secondary | ICD-10-CM | POA: Diagnosis not present

## 2023-01-27 DIAGNOSIS — C7A098 Malignant carcinoid tumors of other sites: Secondary | ICD-10-CM | POA: Insufficient documentation

## 2023-01-27 DIAGNOSIS — K2270 Barrett's esophagus without dysplasia: Secondary | ICD-10-CM | POA: Insufficient documentation

## 2023-01-27 DIAGNOSIS — Z79810 Long term (current) use of selective estrogen receptor modulators (SERMs): Secondary | ICD-10-CM | POA: Diagnosis not present

## 2023-01-27 DIAGNOSIS — C7B8 Other secondary neuroendocrine tumors: Secondary | ICD-10-CM

## 2023-01-27 DIAGNOSIS — K2950 Unspecified chronic gastritis without bleeding: Secondary | ICD-10-CM | POA: Diagnosis not present

## 2023-01-27 DIAGNOSIS — D0501 Lobular carcinoma in situ of right breast: Secondary | ICD-10-CM | POA: Diagnosis not present

## 2023-01-27 DIAGNOSIS — Z79899 Other long term (current) drug therapy: Secondary | ICD-10-CM | POA: Diagnosis not present

## 2023-01-27 DIAGNOSIS — E7800 Pure hypercholesterolemia, unspecified: Secondary | ICD-10-CM | POA: Insufficient documentation

## 2023-01-27 MED ORDER — LANREOTIDE ACETATE 120 MG/0.5ML ~~LOC~~ SOLN
120.0000 mg | Freq: Once | SUBCUTANEOUS | Status: AC
Start: 2023-01-27 — End: 2023-01-27
  Administered 2023-01-27: 120 mg via SUBCUTANEOUS
  Filled 2023-01-27: qty 120

## 2023-01-27 NOTE — Progress Notes (Signed)
[facility] OFFICE PROGRESS NOTE   Diagnosis: Carcinoid tumor  INTERVAL HISTORY:   Sally Brown returns as scheduled.  She feels well.  No fever, flushing, rash, dysphagia, nausea, or diarrhea.  She saw Dr. Lattie Corns last week.  She discussed various treatment options with Dr. Lattie Corns including repeat treatment with Lutathera, changing to an increased dose or different somatostatin analog, and chemotherapy.  She prefers continuing somatostatin analog injection therapy.  Objective:  Vital signs in last 24 hours:  Blood pressure 95/69, pulse 81, temperature 98.1 F (36.7 C), temperature source Temporal, resp. rate 18, height 5\' 2"  (1.575 m), weight 148 lb 14.4 oz (67.5 kg), last menstrual period 05/17/2017, SpO2 99%.  Resp: Lungs clear bilaterally Cardio: Regular rate and rhythm GI: No mass, nontender, no hepatosplenomegaly Vascular: No leg edema   Lab Results:  Lab Results  Component Value Date   WBC 6.0 04/14/2021   HGB 12.7 04/14/2021   HCT 38.0 04/14/2021   MCV 91.8 04/14/2021   PLT 237 04/14/2021   NEUTROABS 3.6 04/14/2021    CMP  Lab Results  Component Value Date   NA 141 11/02/2022   K 4.6 11/02/2022   CL 105 11/02/2022   CO2 30 11/02/2022   GLUCOSE 122 (H) 11/02/2022   BUN 10 11/02/2022   CREATININE 0.84 11/02/2022   CALCIUM 9.8 11/02/2022   PROT 7.3 11/02/2022   ALBUMIN 4.4 11/02/2022   AST 32 11/02/2022   ALT 35 11/02/2022   ALKPHOS 118 11/02/2022   BILITOT 0.6 11/02/2022   GFRNONAA >60 11/02/2022   GFRAA >60 10/08/2019    No results found for: "CEA1", "CEA", "ZOX096", "CA125"  Lab Results  Component Value Date   INR 1.1 06/21/2018   LABPROT 14.3 06/21/2018    Imaging:  No results found.  Medications: I have reviewed the patient's current medications.   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   06/07/2018 MRI of the liver-numerous hepatic lesions  scattered throughout all aspects of the liver.  06/14/2018 PET scan-enlarged and hypermetabolic perihepatic and portacaval lymph nodes; diffuse heterogeneous uptake within the liver with subtle areas of mildly increased FDG uptake corresponding to suspicious lesions identified on MRI.   06/21/2018 biopsy of a right liver lobe mass-low-grade neuroendocrine tumor consistent with metastasis, positive CD56, chromogranin, synaptophysin, CDX-2 and cytokeratin AE1/AE3. 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  Single focus of uptake in the proximal right humerus.  No clear primary tumor identified. Normal chromogranin A 07/05/2018 and normal 24-hour urine 5 HIAA 08/03/2018 Monthly lanreotide beginning 08/03/2018 CT 12/15/2018-small hepatic metastases, mildly improved, slight increase in size of a porta hepatis node CT abdomen/pelvis 07/02/2019-some of the liver lesions are slightly larger, others stable or smaller, slight increase in splenic lesions, minimal increase in abdominal adenopathy Cycle 1 Lutathera 08/14/2019 Cycle 2 Lutathera 10/08/2019 Cycle 3 Lutathera 12/03/2019 Cycle 4 Lutathera 01/29/2020 Netspot 03/11/2020-marked reduction in activity within hepatic metastases with only 1 remaining measurable lesion, decrease in size and radiotracer activity involving periportal and peripancreatic lymph nodes, decreased radiotracer activity at a subcarinal node and resolution of activity at a left supraclavicular node, resolution of focal metabolic activity in the right humerus, near complete resolution of activity in the left T3 transverse process, decreased activity involving a right acetabular lesion, no evidence of new or progressive disease Monthly lanreotide continued Netspot 12/22/2020 (outside facility)-subjective stability of  disease-very small left hepatic lobe and caudate metastatic lesions.  Additional hepatic metastatic lesions not completely excluded.  Metastatic hepatic hilar/portacaval node.  Low suspicion subcarinal lymph node. EUS 01/08/2021-suspicion of Barrett's esophagus 35 cm from the incisors,, no gross lesion in the duodenum or visualized jejunum, no significant pathology in the pancreas, common bile duct, no malignant appearing lymph nodes in the celiac, peripancreatic, porta hepatis region Netspot 12/28/2021-2 radiotracer avid lesions remain in the liver, small with activity minimally above background, no evidence of disease progression.  No evidence of neuroendocrine tumor elsewhere including mediastinal lymph nodes, periportal lymph nodes, and skeletal metastases Netspot 12/29/2022: New small radiotracer avid mediastinal/upper abdominal lymph nodes, stable dominant left hepatic lobe lesion with a new left hepatic lesion, faint activity in the posterior right acetabulum-indeterminate EGD 01/11/2023: Small erosions in the gastric biopsy, localized mild erythema in the gastric antrum-stomach biopsy revealed well-differentiated neuroendocrine tumor (2 mm) within moderate chronic inactive gastritis negative for H. pylori, CDX2 positive 3.   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 Dwain Sarna Bilateral breast MRI 11/11/2020-no MRI evidence for malignancy in either breast. Bilateral mammogram 06/15/2021-no evidence of malignancy. Bilateral breast MRI 12/07/2021-no evidence of malignancy, 1 cm enhancing mass at the left liver unchanged-compatible with known neuroendocrine metastasis 4.   Hypercholesterolemia 5.   Nausea and vomiting following cycle 1 Lutathera 6.  Barrett's esophagus-changes at the GE junction suspicious for Barrett's esophagus on EGD 01/11/2023: Biopsy-Barrett's mucosa, negative for dysplasia      Disposition: Sally Brown has a metastatic low-grade  neuroendocrine tumor.  She has been maintained on treatment with lanreotide since July 2020.  There appears to be a slow progression of the neuroendocrine tumor in the liver and abdominal lymph nodes.  A small focus of neuroendocrine tumor was found in the stomach on EGD 01/11/2023.  She is asymptomatic.  She saw Dr. Lattie Corns last week.  We discussed treatment options including an increased dose of lanreotide, changing to Sandostatin, repeat treatment with Lutathera, and chemotherapy.  She prefers continuing somatostatin analog therapy.  She would like to continue monthly dosing.  She will be changed to Sandostatin LAR 60 mg beginning with the treatment next month.  We reviewed potential toxicities associated with Sandostatin including the chance of diarrhea/constipation, cardiac toxicity, and biliary toxicity.  She agrees to proceed.  She will be scheduled for an office visit and injection therapy 03/01/2023.  We will check a baseline chromogranin a level 03/01/2023.  Thornton Papas, MD  01/27/2023  11:42 AM

## 2023-02-11 ENCOUNTER — Encounter: Payer: Self-pay | Admitting: Gastroenterology

## 2023-02-15 ENCOUNTER — Encounter: Payer: Self-pay | Admitting: Oncology

## 2023-02-15 NOTE — Telephone Encounter (Signed)
Sent message to managed care to fu/

## 2023-02-16 ENCOUNTER — Encounter: Payer: Self-pay | Admitting: *Deleted

## 2023-02-24 ENCOUNTER — Ambulatory Visit: Payer: No Typology Code available for payment source | Admitting: Oncology

## 2023-02-24 ENCOUNTER — Ambulatory Visit: Payer: No Typology Code available for payment source

## 2023-03-01 ENCOUNTER — Inpatient Hospital Stay: Payer: No Typology Code available for payment source | Attending: Nurse Practitioner | Admitting: Oncology

## 2023-03-01 ENCOUNTER — Inpatient Hospital Stay: Payer: No Typology Code available for payment source

## 2023-03-01 VITALS — BP 98/74 | HR 98 | Temp 98.1°F | Resp 18 | Ht 62.0 in | Wt 146.0 lb

## 2023-03-01 DIAGNOSIS — R160 Hepatomegaly, not elsewhere classified: Secondary | ICD-10-CM | POA: Diagnosis not present

## 2023-03-01 DIAGNOSIS — C7A098 Malignant carcinoid tumors of other sites: Secondary | ICD-10-CM | POA: Insufficient documentation

## 2023-03-01 DIAGNOSIS — C7B8 Other secondary neuroendocrine tumors: Secondary | ICD-10-CM | POA: Diagnosis not present

## 2023-03-01 DIAGNOSIS — Z79899 Other long term (current) drug therapy: Secondary | ICD-10-CM | POA: Diagnosis not present

## 2023-03-01 DIAGNOSIS — K2950 Unspecified chronic gastritis without bleeding: Secondary | ICD-10-CM | POA: Insufficient documentation

## 2023-03-01 DIAGNOSIS — K2270 Barrett's esophagus without dysplasia: Secondary | ICD-10-CM | POA: Diagnosis not present

## 2023-03-01 DIAGNOSIS — E7800 Pure hypercholesterolemia, unspecified: Secondary | ICD-10-CM | POA: Diagnosis not present

## 2023-03-01 DIAGNOSIS — R6881 Early satiety: Secondary | ICD-10-CM | POA: Insufficient documentation

## 2023-03-01 DIAGNOSIS — C7A8 Other malignant neuroendocrine tumors: Secondary | ICD-10-CM | POA: Diagnosis not present

## 2023-03-01 LAB — CBC WITH DIFFERENTIAL (CANCER CENTER ONLY)
Abs Immature Granulocytes: 0.01 10*3/uL (ref 0.00–0.07)
Basophils Absolute: 0 10*3/uL (ref 0.0–0.1)
Basophils Relative: 1 %
Eosinophils Absolute: 0.1 10*3/uL (ref 0.0–0.5)
Eosinophils Relative: 2 %
HCT: 38 % (ref 36.0–46.0)
Hemoglobin: 12.8 g/dL (ref 12.0–15.0)
Immature Granulocytes: 0 %
Lymphocytes Relative: 35 %
Lymphs Abs: 2.2 10*3/uL (ref 0.7–4.0)
MCH: 30.5 pg (ref 26.0–34.0)
MCHC: 33.7 g/dL (ref 30.0–36.0)
MCV: 90.5 fL (ref 80.0–100.0)
Monocytes Absolute: 0.5 10*3/uL (ref 0.1–1.0)
Monocytes Relative: 7 %
Neutro Abs: 3.4 10*3/uL (ref 1.7–7.7)
Neutrophils Relative %: 55 %
Platelet Count: 209 10*3/uL (ref 150–400)
RBC: 4.2 MIL/uL (ref 3.87–5.11)
RDW: 13.2 % (ref 11.5–15.5)
WBC Count: 6.2 10*3/uL (ref 4.0–10.5)
nRBC: 0 % (ref 0.0–0.2)

## 2023-03-01 LAB — CMP (CANCER CENTER ONLY)
ALT: 72 U/L — ABNORMAL HIGH (ref 0–44)
AST: 54 U/L — ABNORMAL HIGH (ref 15–41)
Albumin: 4 g/dL (ref 3.5–5.0)
Alkaline Phosphatase: 145 U/L — ABNORMAL HIGH (ref 38–126)
Anion gap: 7 (ref 5–15)
BUN: 22 mg/dL — ABNORMAL HIGH (ref 6–20)
CO2: 27 mmol/L (ref 22–32)
Calcium: 9.4 mg/dL (ref 8.9–10.3)
Chloride: 106 mmol/L (ref 98–111)
Creatinine: 0.89 mg/dL (ref 0.44–1.00)
GFR, Estimated: >60 mL/min (ref >60)
Glucose, Bld: 110 mg/dL — ABNORMAL HIGH (ref 70–99)
Potassium: 4.3 mmol/L (ref 3.5–5.1)
Sodium: 140 mmol/L (ref 135–145)
Total Bilirubin: 0.4 mg/dL (ref 0.0–1.2)
Total Protein: 7.2 g/dL (ref 6.5–8.1)

## 2023-03-01 MED ORDER — OCTREOTIDE ACETATE 20 MG IM KIT: 60.0000 mg | PACK | Freq: Once | INTRAMUSCULAR | Status: DC

## 2023-03-01 MED ORDER — OCTREOTIDE ACETATE 30 MG IM KIT
60.0000 mg | PACK | Freq: Once | INTRAMUSCULAR | Status: AC
  Administered 2023-03-01: 60 mg via INTRAMUSCULAR
  Filled 2023-03-01: qty 2

## 2023-03-01 NOTE — Progress Notes (Signed)
[facility] OFFICE PROGRESS NOTE   Diagnosis: Carcinoid tumor  INTERVAL HISTORY:   [name] returns as scheduled.  She feels well.  She reports early satiety due to Ozempic.  No flushing or diarrhea.  No nausea.  Objective:  Vital signs in last 24 hours:  Blood pressure 98/74, pulse 98, temperature 98.1 F (36.7 C), temperature source Temporal, resp. rate 18, height 5\' 2"  (1.575 m), weight 146 lb (66.2 kg), last menstrual period 05/17/2017, SpO2 100%.    Resp: Lungs clear bilaterally Cardio: Regular rate and rhythm GI: No hepatosplenomegaly, nontender Vascular: No leg edema    Lab Results:  Lab Results  Component Value Date   WBC 6.0 04/14/2021   HGB 12.7 04/14/2021   HCT 38.0 04/14/2021   MCV 91.8 04/14/2021   PLT 237 04/14/2021   NEUTROABS 3.6 04/14/2021    CMP  Lab Results  Component Value Date   NA 141 11/02/2022   K 4.6 11/02/2022   CL 105 11/02/2022   CO2 30 11/02/2022   GLUCOSE 122 (H) 11/02/2022   BUN 10 11/02/2022   CREATININE 0.84 11/02/2022   CALCIUM 9.8 11/02/2022   PROT 7.3 11/02/2022   ALBUMIN 4.4 11/02/2022   AST 32 11/02/2022   ALT 35 11/02/2022   ALKPHOS 118 11/02/2022   BILITOT 0.6 11/02/2022   GFRNONAA >60 11/02/2022   GFRAA >60 10/08/2019     Medications: I have reviewed the patient's current medications.   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   06/07/2018 MRI of the liver-numerous hepatic lesions scattered throughout all aspects of the liver.  06/14/2018 PET scan-enlarged and hypermetabolic perihepatic and portacaval lymph nodes; diffuse heterogeneous uptake within the liver with subtle areas of mildly increased FDG uptake corresponding to suspicious lesions identified on MRI.   06/21/2018 biopsy of a right liver lobe mass-low-grade neuroendocrine tumor consistent with metastasis, positive CD56, chromogranin, synaptophysin, CDX-2 and  cytokeratin AE1/AE3. 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  Single focus of uptake in the proximal right humerus.  No clear primary tumor identified. Normal chromogranin A 07/05/2018 and normal 24-hour urine 5 HIAA 08/03/2018 Monthly lanreotide beginning 08/03/2018 CT 12/15/2018-small hepatic metastases, mildly improved, slight increase in size of a porta hepatis node CT abdomen/pelvis 07/02/2019-some of the liver lesions are slightly larger, others stable or smaller, slight increase in splenic lesions, minimal increase in abdominal adenopathy Cycle 1 Lutathera 08/14/2019 Cycle 2 Lutathera 10/08/2019 Cycle 3 Lutathera 12/03/2019 Cycle 4 Lutathera 01/29/2020 Netspot 03/11/2020-marked reduction in activity within hepatic metastases with only 1 remaining measurable lesion, decrease in size and radiotracer activity involving periportal and peripancreatic lymph nodes, decreased radiotracer activity at a subcarinal node and resolution of activity at a left supraclavicular node, resolution of focal metabolic activity in the right humerus, near complete resolution of activity in the left T3 transverse process, decreased activity involving a right acetabular lesion, no evidence of new or progressive disease Monthly lanreotide continued Netspot 12/22/2020 (outside facility)-subjective stability of disease-very small left hepatic lobe and caudate metastatic lesions.  Additional hepatic metastatic lesions not completely excluded.  Metastatic hepatic hilar/portacaval node.  Low suspicion subcarinal lymph node. EUS 01/08/2021-suspicion of Barrett's esophagus 35 cm from the incisors,, no gross lesion in the duodenum or visualized jejunum, no significant pathology in the pancreas, common bile duct, no malignant appearing lymph nodes in the celiac,  peripancreatic, porta  hepatis region Netspot 12/28/2021-2 radiotracer avid lesions remain in the liver, small with activity minimally above background, no evidence of disease progression.  No evidence of neuroendocrine tumor elsewhere including mediastinal lymph nodes, periportal lymph nodes, and skeletal metastases Netspot 12/29/2022: New small radiotracer avid mediastinal/upper abdominal lymph nodes, stable dominant left hepatic lobe lesion with a new left hepatic lesion, faint activity in the posterior right acetabulum-indeterminate EGD 01/11/2023: Small erosions in the gastric biopsy, localized mild erythema in the gastric antrum-stomach biopsy revealed well-differentiated neuroendocrine tumor (2 mm) within moderate chronic inactive gastritis negative for H. pylori, CDX2 positive Sandostatin 60 mg every 4 weeks starting 03/01/2023 3.   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 Dwain Sarna Bilateral breast MRI 11/11/2020-no MRI evidence for malignancy in either breast. Bilateral mammogram 06/15/2021-no evidence of malignancy. Bilateral breast MRI 12/07/2021-no evidence of malignancy, 1 cm enhancing mass at the left liver unchanged-compatible with known neuroendocrine metastasis 4.   Hypercholesterolemia 5.   Nausea and vomiting following cycle 1 Lutathera 6.  Barrett's esophagus-changes at the GE junction suspicious for Barrett's esophagus on EGD 01/11/2023: Biopsy-Barrett's mucosa, negative for dysplasia      Disposition: Sally Brown appears stable.  Treatment will be changed to Sandostatin LAR, 60 mg, beginning today.  She will return for an office visit and chromogranin A level in 3 months.  She will return to the lab for a baseline chromogranin A today.  We will plan for a restaging dotatate scan in late June.  Thornton Papas, MD  03/01/2023  8:52 AM

## 2023-03-02 LAB — CHROMOGRANIN A: Chromogranin A (ng/mL): 176.4 ng/mL — ABNORMAL HIGH (ref 0.0–101.8)

## 2023-03-03 ENCOUNTER — Encounter: Payer: Self-pay | Admitting: *Deleted

## 2023-03-03 ENCOUNTER — Other Ambulatory Visit: Payer: Self-pay | Admitting: *Deleted

## 2023-03-03 DIAGNOSIS — C7A8 Other malignant neuroendocrine tumors: Secondary | ICD-10-CM

## 2023-03-29 ENCOUNTER — Inpatient Hospital Stay: Payer: No Typology Code available for payment source | Attending: Nurse Practitioner

## 2023-03-29 ENCOUNTER — Inpatient Hospital Stay: Payer: No Typology Code available for payment source

## 2023-03-29 VITALS — BP 106/66 | HR 69 | Temp 98.7°F | Resp 18 | Wt 146.0 lb

## 2023-03-29 DIAGNOSIS — C7B8 Other secondary neuroendocrine tumors: Secondary | ICD-10-CM

## 2023-03-29 DIAGNOSIS — C7A098 Malignant carcinoid tumors of other sites: Secondary | ICD-10-CM | POA: Diagnosis present

## 2023-03-29 DIAGNOSIS — Z79899 Other long term (current) drug therapy: Secondary | ICD-10-CM | POA: Insufficient documentation

## 2023-03-29 LAB — CMP (CANCER CENTER ONLY)
ALT: 33 U/L (ref 0–44)
AST: 30 U/L (ref 15–41)
Albumin: 4.4 g/dL (ref 3.5–5.0)
Alkaline Phosphatase: 91 U/L (ref 38–126)
Anion gap: 7 (ref 5–15)
BUN: 18 mg/dL (ref 6–20)
CO2: 26 mmol/L (ref 22–32)
Calcium: 9.2 mg/dL (ref 8.9–10.3)
Chloride: 106 mmol/L (ref 98–111)
Creatinine: 0.77 mg/dL (ref 0.44–1.00)
GFR, Estimated: >60 mL/min (ref >60)
Glucose, Bld: 130 mg/dL — ABNORMAL HIGH (ref 70–99)
Potassium: 4.2 mmol/L (ref 3.5–5.1)
Sodium: 139 mmol/L (ref 135–145)
Total Bilirubin: 0.4 mg/dL (ref 0.0–1.2)
Total Protein: 7.1 g/dL (ref 6.5–8.1)

## 2023-03-29 MED ORDER — OCTREOTIDE ACETATE 30 MG IM KIT
60.0000 mg | PACK | Freq: Once | INTRAMUSCULAR | Status: AC
  Administered 2023-03-29: 60 mg via INTRAMUSCULAR
  Filled 2023-03-29: qty 2

## 2023-03-29 MED ORDER — OCTREOTIDE ACETATE 20 MG IM KIT: 60.0000 mg | PACK | Freq: Once | INTRAMUSCULAR | Status: DC

## 2023-03-29 NOTE — Patient Instructions (Signed)
 Octreotide Injection Solution What is this medication? OCTREOTIDE (ok TREE oh tide) treats high levels of growth hormone (acromegaly). It works by reducing the amount of growth hormone your body makes. This reduces symptoms and the risk of health problems caused by too much growth hormone, such as diabetes and heart disease. It may also be used to treat diarrhea caused by neuroendocrine tumors. It works by slowing down the release of serotonin from the tumor cells. This reduces the number of bowel movements you have. This medicine may be used for other purposes; ask your health care provider or pharmacist if you have questions. COMMON BRAND NAME(S): Berline Lopes, Sandostatin What should I tell my care team before I take this medication? They need to know if you have any of these conditions: Diabetes Gallbladder disease Heart disease Kidney disease Liver disease Pancreatic disease Thyroid disease An unusual or allergic reaction to octreotide, other medications, foods, dyes, or preservatives Pregnant or trying to get pregnant Breastfeeding How should I use this medication? This medication is injected under the skin or into a vein. It is usually given by your care team in a hospital or clinic setting. If you get this medication at home, you will be taught how to prepare and give it. Use exactly as directed. Take it as directed on the prescription label at the same time every day. Keep taking it unless your care team tells you to stop. Allow the injection solution to come to room temperature before use. Do not warm it artificially. It is important that you put your used needles and syringes in a special sharps container. Do not put them in a trash can. If you do not have a sharps container, call your pharmacist or care team to get one. Talk to your care team about the use of this medication in children. Special care may be needed. Overdosage: If you think you have taken too much of this medicine  contact a poison control center or emergency room at once. NOTE: This medicine is only for you. Do not share this medicine with others. What if I miss a dose? If you miss a dose, take it as soon as you can. If it is almost time for your next dose, take only that dose. Do not take double or extra doses. What may interact with this medication? Bromocriptine Certain medications for blood pressure, heart disease, irregular heartbeat Cyclosporine Diuretics Medications for diabetes, including insulin Quinidine This list may not describe all possible interactions. Give your health care provider a list of all the medicines, herbs, non-prescription drugs, or dietary supplements you use. Also tell them if you smoke, drink alcohol, or use illegal drugs. Some items may interact with your medicine. What should I watch for while using this medication? Visit your care team for regular checks on your progress. Tell your care team if your symptoms do not start to get better or if they get worse. To help reduce irritation at the injection site, use a different site for each injection and make sure the solution is at room temperature before use. This medication may cause decreases in blood sugar. Signs of low blood sugar include chills, cool, pale skin or cold sweats, drowsiness, extreme hunger, fast heartbeat, headache, nausea, nervousness or anxiety, shakiness, trembling, unsteadiness, tiredness, or weakness. Contact your care team right away if you experience any of these symptoms. This medication may increase blood sugar. The risk may be higher in patients who already have diabetes. Ask your care team what you can  do to lower your risk of diabetes while taking this medication. You should make sure you get enough vitamin B12 while you are taking this medication. Discuss the foods you eat and the vitamins you take with your care team. What side effects may I notice from receiving this medication? Side effects that  you should report to your care team as soon as possible: Allergic reactions--skin rash, itching, hives, swelling of the face, lips, tongue, or throat Gallbladder problems--severe stomach pain, nausea, vomiting, fever Heart rhythm changes--fast or irregular heartbeat, dizziness, feeling faint or lightheaded, chest pain, trouble breathing High blood sugar (hyperglycemia)--increased thirst or amount of urine, unusual weakness or fatigue, blurry vision Low blood sugar (hypoglycemia)--tremors or shaking, anxiety, sweating, cold or clammy skin, confusion, dizziness, rapid heartbeat Low thyroid levels (hypothyroidism)--unusual weakness or fatigue, increased sensitivity to cold, constipation, hair loss, dry skin, weight gain, feelings of depression Low vitamin B12 level--pain, tingling, or numbness in the hands or feet, muscle weakness, dizziness, confusion, trouble concentrating Oily or light-colored stools, diarrhea, bloating, weight loss Pancreatitis--severe stomach pain that spreads to your back or gets worse after eating or when touched, fever, nausea, vomiting Slow heartbeat--dizziness, feeling faint or lightheaded, confusion, trouble breathing, unusual weakness or fatigue Side effects that usually do not require medical attention (report these to your care team if they continue or are bothersome): Diarrhea Dizziness Headache Nausea Pain, redness, or irritation at injection site Stomach pain This list may not describe all possible side effects. Call your doctor for medical advice about side effects. You may report side effects to FDA at 1-800-FDA-1088. Where should I keep my medication? Keep out of the reach of children and pets. Store in the refrigerator. Protect from light. Allow to come to room temperature naturally. Do not use artificial heat. If protected from light, the injection may be stored between 20 and 30 degrees C (70 and 86 degrees F) for 14 days. After the initial use, throw away  any unused portion of a multiple dose vial after 14 days. Get rid of any unused portions of the ampules after use. To get rid of medications that are no longer needed or have expired: Take the medication to a medication take-back program. Ask your pharmacy or law enforcement to find a location. If you cannot return the medication, ask your pharmacist or care team how to get rid of the medication safely. NOTE: This sheet is a summary. It may not cover all possible information. If you have questions about this medicine, talk to your doctor, pharmacist, or health care provider.  2024 Elsevier/Gold Standard (2022-12-10 00:00:00)

## 2023-04-26 ENCOUNTER — Inpatient Hospital Stay: Payer: No Typology Code available for payment source | Attending: Nurse Practitioner

## 2023-04-26 ENCOUNTER — Other Ambulatory Visit: Payer: No Typology Code available for payment source

## 2023-04-26 VITALS — BP 107/69 | HR 73 | Temp 97.9°F | Resp 18

## 2023-04-26 DIAGNOSIS — Z79899 Other long term (current) drug therapy: Secondary | ICD-10-CM | POA: Insufficient documentation

## 2023-04-26 DIAGNOSIS — C7A098 Malignant carcinoid tumors of other sites: Secondary | ICD-10-CM | POA: Insufficient documentation

## 2023-04-26 DIAGNOSIS — C7B8 Other secondary neuroendocrine tumors: Secondary | ICD-10-CM

## 2023-04-26 MED ORDER — OCTREOTIDE ACETATE 20 MG IM KIT
60.0000 mg | PACK | Freq: Once | INTRAMUSCULAR | Status: AC
Start: 2023-04-26 — End: 2023-04-26
  Administered 2023-04-26: 60 mg via INTRAMUSCULAR

## 2023-04-26 MED ORDER — OCTREOTIDE ACETATE 30 MG IM KIT: 60.0000 mg | PACK | Freq: Once | INTRAMUSCULAR | Status: DC

## 2023-04-26 NOTE — Patient Instructions (Signed)
 [facility] - A DEPT OF [facility]  Discharge Instructions: Thank you for choosing [facility] to provide your oncology and hematology care.   If you have a lab appointment with the Cancer Center, please go directly to the Cancer Center and check in at the registration area.   Wear comfortable clothing and clothing appropriate for easy access to any Portacath or PICC line.   We strive to give you quality time with your provider. You may need to reschedule your appointment if you arrive late (15 or more minutes).  Arriving late affects you and other patients whose appointments are after yours.  Also, if you miss three or more appointments without notifying the office, you may be dismissed from the clinic at the provider's discretion.      For prescription refill requests, have your pharmacy contact our office and allow 72 hours for refills to be completed.    Today you received the following chemotherapy and/or immunotherapy agents Sandostatin injections   To help prevent nausea and vomiting after your treatment, we encourage you to take your nausea medication as directed.  BELOW ARE SYMPTOMS THAT SHOULD BE REPORTED IMMEDIATELY: *FEVER GREATER THAN 100.4 F (38 C) OR HIGHER *CHILLS OR SWEATING *NAUSEA AND VOMITING THAT IS NOT CONTROLLED WITH YOUR NAUSEA MEDICATION *UNUSUAL SHORTNESS OF BREATH *UNUSUAL BRUISING OR BLEEDING *URINARY PROBLEMS (pain or burning when urinating, or frequent urination) *BOWEL PROBLEMS (unusual diarrhea, constipation, pain near the anus) TENDERNESS IN MOUTH AND THROAT WITH OR WITHOUT PRESENCE OF ULCERS (sore throat, sores in mouth, or a toothache) UNUSUAL RASH, SWELLING OR PAIN  UNUSUAL VAGINAL DISCHARGE OR ITCHING   Items with * indicate a potential emergency and should be followed up as soon as possible or go to the Emergency Department if any problems should occur.  Please show the CHEMOTHERAPY ALERT CARD or  IMMUNOTHERAPY ALERT CARD at check-in to the Emergency Department and triage nurse.  Should you have questions after your visit or need to cancel or reschedule your appointment, please contact [facility] - A DEPT OF [facility]  Dept: [phone]  and follow the prompts.  Office hours are 8:00 a.m. to 4:30 p.m. Monday - Friday. Please note that voicemails left after 4:00 p.m. may not be returned until the following business day.  We are closed weekends and major holidays. You have access to a nurse at all times for urgent questions. Please call the main number to the clinic Dept: [phone] and follow the prompts.   For any non-urgent questions, you may also contact your provider using MyChart. We now offer e-Visits for anyone 76 and older to request care online for non-urgent symptoms. For details visit [email or web address].   Also download the MyChart app! Go to the app store, search "MyChart", open the app, select [place], and log in with your MyChart username and password.

## 2023-04-26 NOTE — Progress Notes (Signed)
 Sandostatin injections given per orders. Patient tolerated it well without problems. Vitals stable and discharged home from clinic ambulatory. Follow up as scheduled.

## 2023-05-04 ENCOUNTER — Other Ambulatory Visit: Payer: Self-pay | Admitting: Family Medicine

## 2023-05-04 DIAGNOSIS — Z1231 Encounter for screening mammogram for malignant neoplasm of breast: Secondary | ICD-10-CM

## 2023-05-17 ENCOUNTER — Other Ambulatory Visit: Payer: Self-pay | Admitting: *Deleted

## 2023-05-17 DIAGNOSIS — C7A8 Other malignant neuroendocrine tumors: Secondary | ICD-10-CM

## 2023-05-24 ENCOUNTER — Inpatient Hospital Stay (HOSPITAL_BASED_OUTPATIENT_CLINIC_OR_DEPARTMENT_OTHER): Payer: No Typology Code available for payment source | Admitting: Oncology

## 2023-05-24 ENCOUNTER — Inpatient Hospital Stay: Payer: No Typology Code available for payment source

## 2023-05-24 ENCOUNTER — Inpatient Hospital Stay: Payer: No Typology Code available for payment source | Attending: Nurse Practitioner

## 2023-05-24 VITALS — BP 94/72 | HR 99 | Temp 98.1°F | Resp 18 | Ht 62.0 in | Wt 146.8 lb

## 2023-05-24 DIAGNOSIS — C7A098 Malignant carcinoid tumors of other sites: Secondary | ICD-10-CM | POA: Insufficient documentation

## 2023-05-24 DIAGNOSIS — R160 Hepatomegaly, not elsewhere classified: Secondary | ICD-10-CM | POA: Diagnosis not present

## 2023-05-24 DIAGNOSIS — C7B8 Other secondary neuroendocrine tumors: Secondary | ICD-10-CM | POA: Diagnosis not present

## 2023-05-24 DIAGNOSIS — C7A8 Other malignant neuroendocrine tumors: Secondary | ICD-10-CM | POA: Diagnosis not present

## 2023-05-24 DIAGNOSIS — E7800 Pure hypercholesterolemia, unspecified: Secondary | ICD-10-CM | POA: Insufficient documentation

## 2023-05-24 DIAGNOSIS — Z79899 Other long term (current) drug therapy: Secondary | ICD-10-CM | POA: Insufficient documentation

## 2023-05-24 DIAGNOSIS — K2270 Barrett's esophagus without dysplasia: Secondary | ICD-10-CM | POA: Diagnosis not present

## 2023-05-24 MED ORDER — OCTREOTIDE ACETATE 30 MG IM KIT
60.0000 mg | PACK | Freq: Once | INTRAMUSCULAR | Status: AC
  Administered 2023-05-24: 60 mg via INTRAMUSCULAR

## 2023-05-24 MED ORDER — OCTREOTIDE ACETATE 20 MG IM KIT
60.0000 mg | PACK | Freq: Once | INTRAMUSCULAR | Status: DC
Start: 2023-05-24 — End: 2023-05-24

## 2023-05-24 NOTE — Patient Instructions (Signed)
 [facility] - A DEPT OF [facility]  Discharge Instructions: Thank you for choosing [facility] to provide your oncology and hematology care.   If you have a lab appointment with the Cancer Center, please go directly to the Cancer Center and check in at the registration area.   Wear comfortable clothing and clothing appropriate for easy access to any Portacath or PICC line.   We strive to give you quality time with your provider. You may need to reschedule your appointment if you arrive late (15 or more minutes).  Arriving late affects you and other patients whose appointments are after yours.  Also, if you miss three or more appointments without notifying the office, you may be dismissed from the clinic at the provider's discretion.      For prescription refill requests, have your pharmacy contact our office and allow 72 hours for refills to be completed.    Today you received the following chemotherapy and/or immunotherapy agents Sandostation.  Octreotide  Injection Solution What is this medication? OCTREOTIDE  (ok TREE oh tide) treats high levels of growth hormone (acromegaly). It works by reducing the amount of growth hormone your body makes. This reduces symptoms and the risk of health problems caused by too much growth hormone, such as diabetes and heart disease. It may also be used to treat diarrhea caused by neuroendocrine tumors. It works by slowing down the release of serotonin from the tumor cells. This reduces the number of bowel movements you have. This medicine may be used for other purposes; ask your health care provider or pharmacist if you have questions. COMMON BRAND NAME(S): Bynfezia, Sandostatin  What should I tell my care team before I take this medication? They need to know if you have any of these conditions: Diabetes Gallbladder disease Heart disease Kidney disease Liver disease Pancreatic disease Thyroid disease An  unusual or allergic reaction to octreotide , other medications, foods, dyes, or preservatives Pregnant or trying to get pregnant Breastfeeding How should I use this medication? This medication is injected under the skin or into a vein. It is usually given by your care team in a hospital or clinic setting. If you get this medication at home, you will be taught how to prepare and give it. Use exactly as directed. Take it as directed on the prescription label at the same time every day. Keep taking it unless your care team tells you to stop. Allow the injection solution to come to room temperature before use. Do not warm it artificially. It is important that you put your used needles and syringes in a special sharps container. Do not put them in a trash can. If you do not have a sharps container, call your pharmacist or care team to get one. Talk to your care team about the use of this medication in children. Special care may be needed. Overdosage: If you think you have taken too much of this medicine contact a poison control center or emergency room at once. NOTE: This medicine is only for you. Do not share this medicine with others. What if I miss a dose? If you miss a dose, take it as soon as you can. If it is almost time for your next dose, take only that dose. Do not take double or extra doses. What may interact with this medication? Bromocriptine Certain medications for blood pressure, heart disease, irregular heartbeat Cyclosporine Diuretics Medications for diabetes, including insulin Quinidine This list may not describe all possible  interactions. Give your health care provider a list of all the medicines, herbs, non-prescription drugs, or dietary supplements you use. Also tell them if you smoke, drink alcohol, or use illegal drugs. Some items may interact with your medicine. What should I watch for while using this medication? Visit your care team for regular checks on your progress. Tell  your care team if your symptoms do not start to get better or if they get worse. To help reduce irritation at the injection site, use a different site for each injection and make sure the solution is at room temperature before use. This medication may cause decreases in blood sugar. Signs of low blood sugar include chills, cool, pale skin or cold sweats, drowsiness, extreme hunger, fast heartbeat, headache, nausea, nervousness or anxiety, shakiness, trembling, unsteadiness, tiredness, or weakness. Contact your care team right away if you experience any of these symptoms. This medication may increase blood sugar. The risk may be higher in patients who already have diabetes. Ask your care team what you can do to lower your risk of diabetes while taking this medication. You should make sure you get enough vitamin B12 while you are taking this medication. Discuss the foods you eat and the vitamins you take with your care team. What side effects may I notice from receiving this medication? Side effects that you should report to your care team as soon as possible: Allergic reactions--skin rash, itching, hives, swelling of the face, lips, tongue, or throat Gallbladder problems--severe stomach pain, nausea, vomiting, fever Heart rhythm changes--fast or irregular heartbeat, dizziness, feeling faint or lightheaded, chest pain, trouble breathing High blood sugar (hyperglycemia)--increased thirst or amount of urine, unusual weakness or fatigue, blurry vision Low blood sugar (hypoglycemia)--tremors or shaking, anxiety, sweating, cold or clammy skin, confusion, dizziness, rapid heartbeat Low thyroid levels (hypothyroidism)--unusual weakness or fatigue, increased sensitivity to cold, constipation, hair loss, dry skin, weight gain, feelings of depression Low vitamin B12 level--pain, tingling, or numbness in the hands or feet, muscle weakness, dizziness, confusion, trouble concentrating Oily or light-colored stools,  diarrhea, bloating, weight loss Pancreatitis--severe stomach pain that spreads to your back or gets worse after eating or when touched, fever, nausea, vomiting Slow heartbeat--dizziness, feeling faint or lightheaded, confusion, trouble breathing, unusual weakness or fatigue Side effects that usually do not require medical attention (report these to your care team if they continue or are bothersome): Diarrhea Dizziness Headache Nausea Pain, redness, or irritation at injection site Stomach pain This list may not describe all possible side effects. Call your doctor for medical advice about side effects. You may report side effects to FDA at 1-800-FDA-1088. Where should I keep my medication? Keep out of the reach of children and pets. Store in the refrigerator. Protect from light. Allow to come to room temperature naturally. Do not use artificial heat. If protected from light, the injection may be stored between 20 and 30 degrees C (70 and 86 degrees F) for 14 days. After the initial use, throw away any unused portion of a multiple dose vial after 14 days. Get rid of any unused portions of the ampules after use. To get rid of medications that are no longer needed or have expired: Take the medication to a medication take-back program. Ask your pharmacy or law enforcement to find a location. If you cannot return the medication, ask your pharmacist or care team how to get rid of the medication safely. NOTE: This sheet is a summary. It may not cover all possible information. If you have  questions about this medicine, talk to your doctor, pharmacist, or health care provider.  2024 Elsevier/Gold Standard (2022-12-10 00:00:00)      To help prevent nausea and vomiting after your treatment, we encourage you to take your nausea medication as directed.  BELOW ARE SYMPTOMS THAT SHOULD BE REPORTED IMMEDIATELY: *FEVER GREATER THAN 100.4 F (38 C) OR HIGHER *CHILLS OR SWEATING *NAUSEA AND VOMITING THAT IS NOT  CONTROLLED WITH YOUR NAUSEA MEDICATION *UNUSUAL SHORTNESS OF BREATH *UNUSUAL BRUISING OR BLEEDING *URINARY PROBLEMS (pain or burning when urinating, or frequent urination) *BOWEL PROBLEMS (unusual diarrhea, constipation, pain near the anus) TENDERNESS IN MOUTH AND THROAT WITH OR WITHOUT PRESENCE OF ULCERS (sore throat, sores in mouth, or a toothache) UNUSUAL RASH, SWELLING OR PAIN  UNUSUAL VAGINAL DISCHARGE OR ITCHING   Items with * indicate a potential emergency and should be followed up as soon as possible or go to the Emergency Department if any problems should occur.  Please show the CHEMOTHERAPY ALERT CARD or IMMUNOTHERAPY ALERT CARD at check-in to the Emergency Department and triage nurse.  Should you have questions after your visit or need to cancel or reschedule your appointment, please contact [facility] - A DEPT OF [facility]  Dept: [phone]  and follow the prompts.  Office hours are 8:00 a.m. to 4:30 p.m. Monday - Friday. Please note that voicemails left after 4:00 p.m. may not be returned until the following business day.  We are closed weekends and major holidays. You have access to a nurse at all times for urgent questions. Please call the main number to the clinic Dept: [phone] and follow the prompts.   For any non-urgent questions, you may also contact your provider using MyChart. We now offer e-Visits for anyone 55 and older to request care online for non-urgent symptoms. For details visit [email or web address].   Also download the MyChart app! Go to the app store, search "MyChart", open the app, select [facility], and log in with your MyChart username and password.

## 2023-05-24 NOTE — Progress Notes (Signed)
 [facility] OFFICE PROGRESS NOTE   Diagnosis: Carcinoid tumor  INTERVAL HISTORY:   Sally Brown returns as scheduled.  She has been treated with Sandostatin  at the 60 mg dose since February.  She reports tolerating the increased dose well.  No flushing or diarrhea.  She feels well.  Good appetite.  She had a second opinion appointment with Dr. America Just last month.  She agrees with the escalated dose of Sandostatin .  Objective:  Vital signs in last 24 hours:  Blood pressure 94/72, pulse 99, temperature 98.1 F (36.7 C), temperature source Temporal, resp. rate 18, height 5\' 2"  (1.575 m), weight 146 lb 12.8 oz (66.6 kg), last menstrual period 05/17/2017, SpO2 99%.   Resp: Lungs clear bilaterally Cardio: Regular rate and rhythm GI: No hepatosplenomegaly, no mass, nontender Vascular: No leg edema   Lab Results:  Lab Results  Component Value Date   WBC 6.2 03/01/2023   HGB 12.8 03/01/2023   HCT 38.0 03/01/2023   MCV 90.5 03/01/2023   PLT 209 03/01/2023   NEUTROABS 3.4 03/01/2023    CMP  Lab Results  Component Value Date   NA 139 03/29/2023   K 4.2 03/29/2023   CL 106 03/29/2023   CO2 26 03/29/2023   GLUCOSE 130 (H) 03/29/2023   BUN 18 03/29/2023   CREATININE 0.77 03/29/2023   CALCIUM 9.2 03/29/2023   PROT 7.1 03/29/2023   ALBUMIN 4.4 03/29/2023   AST 30 03/29/2023   ALT 33 03/29/2023   ALKPHOS 91 03/29/2023   BILITOT 0.4 03/29/2023   GFRNONAA >60 03/29/2023   GFRAA >60 10/08/2019    Medications: I have reviewed the patient's current medications.   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   06/07/2018 MRI of the liver-numerous hepatic lesions scattered throughout all aspects of the liver.  06/14/2018 PET scan-enlarged and hypermetabolic perihepatic and portacaval lymph nodes; diffuse heterogeneous uptake within the liver with subtle areas of mildly increased FDG uptake  corresponding to suspicious lesions identified on MRI.   06/21/2018 biopsy of a right liver lobe mass-low-grade neuroendocrine tumor consistent with metastasis, positive CD56, chromogranin, synaptophysin, CDX-2 and cytokeratin AE1/AE3. 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  Single focus of uptake in the proximal right humerus.  No clear primary tumor identified. Normal chromogranin A 07/05/2018 and normal 24-hour urine 5 HIAA 08/03/2018 Monthly lanreotide beginning 08/03/2018 CT 12/15/2018-small hepatic metastases, mildly improved, slight increase in size of a porta hepatis node CT abdomen/pelvis 07/02/2019-some of the liver lesions are slightly larger, others stable or smaller, slight increase in splenic lesions, minimal increase in abdominal adenopathy Cycle 1 Lutathera  08/14/2019 Cycle 2 Lutathera  10/08/2019 Cycle 3 Lutathera  12/03/2019 Cycle 4 Lutathera  01/29/2020 Netspot  03/11/2020-marked reduction in activity within hepatic metastases with only 1 remaining measurable lesion, decrease in size and radiotracer activity involving periportal and peripancreatic lymph nodes, decreased radiotracer activity at a subcarinal node and resolution of activity at a left supraclavicular node, resolution of focal metabolic activity in the right humerus, near complete resolution of activity in the left T3 transverse process, decreased activity involving a right acetabular lesion, no evidence of new or progressive disease Monthly lanreotide continued Netspot  12/22/2020 (outside facility)-subjective stability of disease-very small left hepatic lobe and caudate metastatic lesions.  Additional hepatic metastatic lesions not completely excluded.  Metastatic hepatic hilar/portacaval node.  Low suspicion subcarinal lymph node. EUS 01/08/2021-suspicion  of  Barrett's esophagus 35 cm from the incisors,, no gross lesion in the duodenum or visualized jejunum, no significant pathology in the pancreas, common bile duct, no malignant appearing lymph nodes in the celiac, peripancreatic, porta hepatis region Netspot  12/28/2021-2 radiotracer avid lesions remain in the liver, small with activity minimally above background, no evidence of disease progression.  No evidence of neuroendocrine tumor elsewhere including mediastinal lymph nodes, periportal lymph nodes, and skeletal metastases Netspot  12/29/2022: New small radiotracer avid mediastinal/upper abdominal lymph nodes, stable dominant left hepatic lobe lesion with a new left hepatic lesion, faint activity in the posterior right acetabulum-indeterminate EGD 01/11/2023: Small erosions in the gastric biopsy, localized mild erythema in the gastric antrum-stomach biopsy revealed well-differentiated neuroendocrine tumor (2 mm) within moderate chronic inactive gastritis negative for H. pylori, CDX2 positive Sandostatin  60 mg every 4 weeks starting 03/01/2023 3.   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 Delane Fear Bilateral breast MRI 11/11/2020-no MRI evidence for malignancy in either breast. Bilateral mammogram 06/15/2021-no evidence of malignancy. Bilateral breast MRI 12/07/2021-no evidence of malignancy, 1 cm enhancing mass at the left liver unchanged-compatible with known neuroendocrine metastasis 4.   Hypercholesterolemia 5.   Nausea and vomiting following cycle 1 Lutathera  6.  Barrett's esophagus-changes at the GE junction suspicious for Barrett's esophagus on EGD 01/11/2023: Biopsy-Barrett's mucosa, negative for dysplasia       Disposition: [name] appears stable.  She is tolerating the Sandostatin  well.  There is no clinical evidence for progression of the metastatic neuroendocrine tumor.  She will complete another treatment with Sandostatin  today. We will  follow-up on the chromogranin a level from today, though she did not hold Prilosec prior to the lab today.  She will continue monthly Sandostatin .  She will be scheduled for a restaging dotatate scan at the end of June.  She will return for an office visit on 07/19/2023.  Coni Deep, MD  05/24/2023  10:26 AM

## 2023-05-24 NOTE — Progress Notes (Signed)
 Patient Presents today for Sandostatin  injection with DR visit. Patient tolerated injection in right and left ventrogluteal with no complaints voiced.  Site clean and dry with no bruising or swelling noted.  No complaints of pain.  Discharged with vital signs stable and no signs or symptoms of distress noted.

## 2023-05-25 ENCOUNTER — Telehealth: Payer: Self-pay | Admitting: Oncology

## 2023-05-25 LAB — CHROMOGRANIN A: Chromogranin A (ng/mL): 242.6 ng/mL — ABNORMAL HIGH (ref 0.0–101.8)

## 2023-05-25 NOTE — Telephone Encounter (Signed)
 Patient has been scheduled for follow-up visit per 05/19/23 LOS.  Pt given an appt calendar with date and time.

## 2023-05-30 ENCOUNTER — Encounter: Payer: Self-pay | Admitting: Oncology

## 2023-05-30 ENCOUNTER — Telehealth: Payer: Self-pay | Admitting: Oncology

## 2023-05-30 NOTE — Telephone Encounter (Signed)
 Patient has been scheduled for follow-up visit per 05/30/23 LOS.  Pt given an appt calendar with date and time.

## 2023-06-07 ENCOUNTER — Encounter: Payer: Self-pay | Admitting: Oncology

## 2023-06-13 ENCOUNTER — Telehealth: Payer: Self-pay

## 2023-06-13 NOTE — Telephone Encounter (Signed)
 Patient had previously expressed concerns to the collaboration nurse regarding her last injection visit. A follow-up telephone call was placed to the patient to discuss these concerns. During the conversation, the patient expressed appreciation for the outreach and the opportunity to be heard. She was reassured that her feedback is valued and was encouraged to continue sharing any concerns that may arise in the future.

## 2023-06-20 ENCOUNTER — Ambulatory Visit
Admission: RE | Admit: 2023-06-20 | Discharge: 2023-06-20 | Disposition: A | Discharge status: Home / Self Care | Source: Ambulatory Visit | Attending: Family Medicine | Admitting: Family Medicine

## 2023-06-20 ENCOUNTER — Encounter: Payer: Self-pay | Admitting: Oncology

## 2023-06-20 DIAGNOSIS — Z1231 Encounter for screening mammogram for malignant neoplasm of breast: Secondary | ICD-10-CM

## 2023-06-21 ENCOUNTER — Ambulatory Visit

## 2023-06-23 ENCOUNTER — Inpatient Hospital Stay: Attending: Nurse Practitioner

## 2023-06-23 ENCOUNTER — Ambulatory Visit

## 2023-06-23 ENCOUNTER — Other Ambulatory Visit (HOSPITAL_BASED_OUTPATIENT_CLINIC_OR_DEPARTMENT_OTHER): Payer: Self-pay | Admitting: Gastroenterology

## 2023-06-23 ENCOUNTER — Ambulatory Visit (HOSPITAL_COMMUNITY)
Admission: RE | Admit: 2023-06-23 | Discharge: 2023-06-23 | Disposition: A | Discharge status: Home / Self Care | Source: Ambulatory Visit | Attending: Oncology | Admitting: Oncology

## 2023-06-23 DIAGNOSIS — C7A098 Malignant carcinoid tumors of other sites: Secondary | ICD-10-CM | POA: Diagnosis present

## 2023-06-23 DIAGNOSIS — C7A8 Other malignant neuroendocrine tumors: Secondary | ICD-10-CM | POA: Insufficient documentation

## 2023-06-23 DIAGNOSIS — C7B8 Other secondary neuroendocrine tumors: Secondary | ICD-10-CM | POA: Insufficient documentation

## 2023-06-23 DIAGNOSIS — Z79899 Other long term (current) drug therapy: Secondary | ICD-10-CM | POA: Diagnosis not present

## 2023-06-23 MED ORDER — OCTREOTIDE ACETATE 20 MG IM KIT: 60.0000 mg | PACK | Freq: Once | INTRAMUSCULAR | Status: DC

## 2023-06-23 MED ORDER — COPPER CU 64 DOTATATE 1 MCI/ML IV SOLN
4.0000 | Freq: Once | INTRAVENOUS | Status: AC
  Administered 2023-06-23: 3.984 via INTRAVENOUS

## 2023-06-23 MED ORDER — OCTREOTIDE ACETATE 30 MG IM KIT
60.0000 mg | PACK | Freq: Once | INTRAMUSCULAR | Status: AC
  Administered 2023-06-23: 60 mg via INTRAMUSCULAR
  Filled 2023-06-23: qty 2

## 2023-06-27 ENCOUNTER — Encounter: Payer: Self-pay | Admitting: Oncology

## 2023-06-28 ENCOUNTER — Encounter: Payer: Self-pay | Admitting: *Deleted

## 2023-06-28 ENCOUNTER — Ambulatory Visit: Payer: Self-pay | Admitting: Oncology

## 2023-07-19 ENCOUNTER — Inpatient Hospital Stay

## 2023-07-19 ENCOUNTER — Inpatient Hospital Stay: Attending: Nurse Practitioner | Admitting: Oncology

## 2023-07-19 VITALS — BP 98/66 | HR 76 | Temp 97.8°F | Resp 18 | Ht 62.0 in | Wt 140.0 lb

## 2023-07-19 DIAGNOSIS — C7A098 Malignant carcinoid tumors of other sites: Secondary | ICD-10-CM | POA: Diagnosis present

## 2023-07-19 DIAGNOSIS — J069 Acute upper respiratory infection, unspecified: Secondary | ICD-10-CM | POA: Insufficient documentation

## 2023-07-19 DIAGNOSIS — K2950 Unspecified chronic gastritis without bleeding: Secondary | ICD-10-CM | POA: Diagnosis not present

## 2023-07-19 DIAGNOSIS — Z79899 Other long term (current) drug therapy: Secondary | ICD-10-CM | POA: Diagnosis not present

## 2023-07-19 DIAGNOSIS — C7A8 Other malignant neuroendocrine tumors: Secondary | ICD-10-CM

## 2023-07-19 DIAGNOSIS — C7B8 Other secondary neuroendocrine tumors: Secondary | ICD-10-CM | POA: Diagnosis not present

## 2023-07-19 DIAGNOSIS — E7800 Pure hypercholesterolemia, unspecified: Secondary | ICD-10-CM | POA: Insufficient documentation

## 2023-07-19 DIAGNOSIS — K2270 Barrett's esophagus without dysplasia: Secondary | ICD-10-CM | POA: Insufficient documentation

## 2023-07-19 DIAGNOSIS — K259 Gastric ulcer, unspecified as acute or chronic, without hemorrhage or perforation: Secondary | ICD-10-CM | POA: Diagnosis not present

## 2023-07-19 MED ORDER — OCTREOTIDE ACETATE 30 MG IM KIT
60.0000 mg | PACK | Freq: Once | INTRAMUSCULAR | Status: AC
  Administered 2023-07-19: 60 mg via INTRAMUSCULAR

## 2023-07-19 NOTE — Progress Notes (Signed)
 [facility] OFFICE PROGRESS NOTE   Diagnosis: Carcinoid tumor  INTERVAL HISTORY:   Ms. Raczkowski turns as scheduled.  She continues monthly Sandostatin .  No flushing or consistent diarrhea.  She developed an upper respiratory infection approximate 3 weeks ago with a cough, rhinorrhea, and sore throat.  The symptoms are improving.  She discontinued Ozempic when she became ill.  She reports diarrhea for the past few weeks.  She relates this to discontinuing Ozempic and remaining on metformin. No right hip pain. She had the chromogranin a level checked at [facility] medicine on 06/24/2023.  She held Prilosec to the blood draw. Objective:  Vital signs in last 24 hours:  Blood pressure 98/66, pulse 76, temperature 97.8 F (36.6 C), temperature source Temporal, resp. rate 18, height 5' 2 (1.575 m), weight 140 lb (63.5 kg), last menstrual period 05/17/2017, SpO2 100%.   Lymphatics: No cervical, supraclavicular, or axillary nodes Resp: Lungs clear bilaterally Cardio: Regular rate and rhythm GI: No hepatosplenomegaly, no mass, nontender Vascular: No leg edema Musculoskeletal: No pain with motion at the right hip, no tenderness at the right trochanter    Lab Results:  Lab Results  Component Value Date   WBC 6.2 03/01/2023   HGB 12.8 03/01/2023   HCT 38.0 03/01/2023   MCV 90.5 03/01/2023   PLT 209 03/01/2023   NEUTROABS 3.4 03/01/2023    CMP  Lab Results  Component Value Date   NA 139 03/29/2023   K 4.2 03/29/2023   CL 106 03/29/2023   CO2 26 03/29/2023   GLUCOSE 130 (H) 03/29/2023   BUN 18 03/29/2023   CREATININE 0.77 03/29/2023   CALCIUM 9.2 03/29/2023   PROT 7.1 03/29/2023   ALBUMIN 4.4 03/29/2023   AST 30 03/29/2023   ALT 33 03/29/2023   ALKPHOS 91 03/29/2023   BILITOT 0.4 03/29/2023   GFRNONAA >60 03/29/2023   GFRAA >60 10/08/2019  Chromogranin A on 06/24/2023: 150.6  Medications: I have reviewed the patient's current  medications.   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   06/07/2018 MRI of the liver-numerous hepatic lesions scattered throughout all aspects of the liver.  06/14/2018 PET scan-enlarged and hypermetabolic perihepatic and portacaval lymph nodes; diffuse heterogeneous uptake within the liver with subtle areas of mildly increased FDG uptake corresponding to suspicious lesions identified on MRI.   06/21/2018 biopsy of a right liver lobe mass-low-grade neuroendocrine tumor consistent with metastasis, positive CD56, chromogranin, synaptophysin, CDX-2 and cytokeratin AE1/AE3. 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  Single focus of uptake in the proximal right humerus.  No clear primary tumor identified. Normal chromogranin A 07/05/2018 and normal 24-hour urine 5 HIAA 08/03/2018 Monthly lanreotide beginning 08/03/2018 CT 12/15/2018-small hepatic metastases, mildly improved, slight increase in size of a porta hepatis node CT abdomen/pelvis 07/02/2019-some of the liver lesions are slightly larger, others stable or smaller, slight increase in splenic lesions, minimal increase in abdominal adenopathy Cycle 1 Lutathera  08/14/2019 Cycle 2 Lutathera  10/08/2019 Cycle 3 Lutathera  12/03/2019 Cycle 4 Lutathera  01/29/2020 Netspot  03/11/2020-marked reduction in activity within hepatic metastases with only 1 remaining measurable lesion, decrease in size and radiotracer activity involving periportal and peripancreatic lymph nodes, decreased radiotracer activity at a subcarinal node and resolution of activity at a left supraclavicular node, resolution of focal metabolic activity in the right humerus, near complete resolution of activity in the left T3 transverse  process, decreased  activity involving a right acetabular lesion, no evidence of new or progressive disease Monthly lanreotide continued Netspot  12/22/2020 (outside facility)-subjective stability of disease-very small left hepatic lobe and caudate metastatic lesions.  Additional hepatic metastatic lesions not completely excluded.  Metastatic hepatic hilar/portacaval node.  Low suspicion subcarinal lymph node. EUS 01/08/2021-suspicion of Barrett's esophagus 35 cm from the incisors,, no gross lesion in the duodenum or visualized jejunum, no significant pathology in the pancreas, common bile duct, no malignant appearing lymph nodes in the celiac, peripancreatic, porta hepatis region Netspot  12/28/2021-2 radiotracer avid lesions remain in the liver, small with activity minimally above background, no evidence of disease progression.  No evidence of neuroendocrine tumor elsewhere including mediastinal lymph nodes, periportal lymph nodes, and skeletal metastases Netspot  12/29/2022: New small radiotracer avid mediastinal/upper abdominal lymph nodes, stable dominant left hepatic lobe lesion with a new left hepatic lesion, faint activity in the posterior right acetabulum-indeterminate EGD 01/11/2023: Small erosions in the gastric biopsy, localized mild erythema in the gastric antrum-stomach biopsy revealed well-differentiated neuroendocrine tumor (2 mm) within moderate chronic inactive gastritis negative for H. pylori, CDX2 positive Sandostatin  60 mg every 4 weeks starting 03/01/2023 Netspot  06/23/2023: Stable pattern of metastatic disease, slight increase in radiotracer activity involving small metastatic mediastinal lymph nodes, stable activity in the liver, upper abdominal adenopathy, and a right acetabulum lesion Sandostatin  60 mg every 4 weeks continued 3.   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 Ebbie Bilateral breast MRI 11/11/2020-no MRI evidence for malignancy in either  breast. Bilateral mammogram 06/15/2021-no evidence of malignancy. Bilateral breast MRI 12/07/2021-no evidence of malignancy, 1 cm enhancing mass at the left liver unchanged-compatible with known neuroendocrine metastasis 4.   Hypercholesterolemia 5.   Nausea and vomiting following cycle 1 Lutathera  6.  Barrett's esophagus-changes at the GE junction suspicious for Barrett's esophagus on EGD 01/11/2023: Biopsy-Barrett's mucosa, negative for dysplasia       Disposition: Ms. Boudreau appears stable.  I reviewed the restaging PET findings and images with her.  The overall pattern of metastatic disease appears unchanged.  No new lesions.  The chromogranin A level was stable last month.  The plan is to continue Sandostatin  at the current dose.  She will return for Sandostatin  every 4 weeks.  She will be scheduled for an office visit in 12 weeks.  She is not symptomatic from the right acetabulum lesion.  She will call if she develops pain in this area.  Arley Hof, MD  07/19/2023  8:22 AM

## 2023-08-16 ENCOUNTER — Inpatient Hospital Stay: Attending: Nurse Practitioner

## 2023-08-16 VITALS — BP 118/86 | HR 74 | Temp 98.1°F | Resp 18 | Wt 142.5 lb

## 2023-08-16 DIAGNOSIS — C7A098 Malignant carcinoid tumors of other sites: Secondary | ICD-10-CM | POA: Insufficient documentation

## 2023-08-16 DIAGNOSIS — Z79899 Other long term (current) drug therapy: Secondary | ICD-10-CM | POA: Insufficient documentation

## 2023-08-16 DIAGNOSIS — C7B8 Other secondary neuroendocrine tumors: Secondary | ICD-10-CM

## 2023-08-16 MED ORDER — OCTREOTIDE ACETATE 30 MG IM KIT
60.0000 mg | PACK | Freq: Once | INTRAMUSCULAR | Status: AC
  Administered 2023-08-16: 60 mg via INTRAMUSCULAR
  Filled 2023-08-16: qty 2

## 2023-08-16 NOTE — Patient Instructions (Signed)
 [facility] - A DEPT OF [facility]  Discharge Instructions: Thank you for choosing [facility] to provide your oncology and hematology care.   If you have a lab appointment with the Cancer Center, please go directly to the Cancer Center and check in at the registration area.   Wear comfortable clothing and clothing appropriate for easy access to any Portacath or PICC line.   We strive to give you quality time with your provider. You may need to reschedule your appointment if you arrive late (15 or more minutes).  Arriving late affects you and other patients whose appointments are after yours.  Also, if you miss three or more appointments without notifying the office, you may be dismissed from the clinic at the provider's discretion.      For prescription refill requests, have your pharmacy contact our office and allow 72 hours for refills to be completed.    Today you received the following chemotherapy and/or immunotherapy agents Sandostation.  Octreotide  Injection Solution What is this medication? OCTREOTIDE  (ok TREE oh tide) treats high levels of growth hormone (acromegaly). It works by reducing the amount of growth hormone your body makes. This reduces symptoms and the risk of health problems caused by too much growth hormone, such as diabetes and heart disease. It may also be used to treat diarrhea caused by neuroendocrine tumors. It works by slowing down the release of serotonin from the tumor cells. This reduces the number of bowel movements you have. This medicine may be used for other purposes; ask your health care provider or pharmacist if you have questions. COMMON BRAND NAME(S): Bynfezia, Sandostatin  What should I tell my care team before I take this medication? They need to know if you have any of these conditions: Diabetes Gallbladder disease Heart disease Kidney disease Liver disease Pancreatic disease Thyroid disease An  unusual or allergic reaction to octreotide , other medications, foods, dyes, or preservatives Pregnant or trying to get pregnant Breastfeeding How should I use this medication? This medication is injected under the skin or into a vein. It is usually given by your care team in a hospital or clinic setting. If you get this medication at home, you will be taught how to prepare and give it. Use exactly as directed. Take it as directed on the prescription label at the same time every day. Keep taking it unless your care team tells you to stop. Allow the injection solution to come to room temperature before use. Do not warm it artificially. It is important that you put your used needles and syringes in a special sharps container. Do not put them in a trash can. If you do not have a sharps container, call your pharmacist or care team to get one. Talk to your care team about the use of this medication in children. Special care may be needed. Overdosage: If you think you have taken too much of this medicine contact a poison control center or emergency room at once. NOTE: This medicine is only for you. Do not share this medicine with others. What if I miss a dose? If you miss a dose, take it as soon as you can. If it is almost time for your next dose, take only that dose. Do not take double or extra doses. What may interact with this medication? Bromocriptine Certain medications for blood pressure, heart disease, irregular heartbeat Cyclosporine Diuretics Medications for diabetes, including insulin Quinidine This list may not describe all possible  interactions. Give your health care provider a list of all the medicines, herbs, non-prescription drugs, or dietary supplements you use. Also tell them if you smoke, drink alcohol, or use illegal drugs. Some items may interact with your medicine. What should I watch for while using this medication? Visit your care team for regular checks on your progress. Tell  your care team if your symptoms do not start to get better or if they get worse. To help reduce irritation at the injection site, use a different site for each injection and make sure the solution is at room temperature before use. This medication may cause decreases in blood sugar. Signs of low blood sugar include chills, cool, pale skin or cold sweats, drowsiness, extreme hunger, fast heartbeat, headache, nausea, nervousness or anxiety, shakiness, trembling, unsteadiness, tiredness, or weakness. Contact your care team right away if you experience any of these symptoms. This medication may increase blood sugar. The risk may be higher in patients who already have diabetes. Ask your care team what you can do to lower your risk of diabetes while taking this medication. You should make sure you get enough vitamin B12 while you are taking this medication. Discuss the foods you eat and the vitamins you take with your care team. What side effects may I notice from receiving this medication? Side effects that you should report to your care team as soon as possible: Allergic reactions--skin rash, itching, hives, swelling of the face, lips, tongue, or throat Gallbladder problems--severe stomach pain, nausea, vomiting, fever Heart rhythm changes--fast or irregular heartbeat, dizziness, feeling faint or lightheaded, chest pain, trouble breathing High blood sugar (hyperglycemia)--increased thirst or amount of urine, unusual weakness or fatigue, blurry vision Low blood sugar (hypoglycemia)--tremors or shaking, anxiety, sweating, cold or clammy skin, confusion, dizziness, rapid heartbeat Low thyroid levels (hypothyroidism)--unusual weakness or fatigue, increased sensitivity to cold, constipation, hair loss, dry skin, weight gain, feelings of depression Low vitamin B12 level--pain, tingling, or numbness in the hands or feet, muscle weakness, dizziness, confusion, trouble concentrating Oily or light-colored stools,  diarrhea, bloating, weight loss Pancreatitis--severe stomach pain that spreads to your back or gets worse after eating or when touched, fever, nausea, vomiting Slow heartbeat--dizziness, feeling faint or lightheaded, confusion, trouble breathing, unusual weakness or fatigue Side effects that usually do not require medical attention (report these to your care team if they continue or are bothersome): Diarrhea Dizziness Headache Nausea Pain, redness, or irritation at injection site Stomach pain This list may not describe all possible side effects. Call your doctor for medical advice about side effects. You may report side effects to FDA at 1-800-FDA-1088. Where should I keep my medication? Keep out of the reach of children and pets. Store in the refrigerator. Protect from light. Allow to come to room temperature naturally. Do not use artificial heat. If protected from light, the injection may be stored between 20 and 30 degrees C (70 and 86 degrees F) for 14 days. After the initial use, throw away any unused portion of a multiple dose vial after 14 days. Get rid of any unused portions of the ampules after use. To get rid of medications that are no longer needed or have expired: Take the medication to a medication take-back program. Ask your pharmacy or law enforcement to find a location. If you cannot return the medication, ask your pharmacist or care team how to get rid of the medication safely. NOTE: This sheet is a summary. It may not cover all possible information. If you have  questions about this medicine, talk to your doctor, pharmacist, or health care provider.  2024 Elsevier/Gold Standard (2022-12-10 00:00:00)      To help prevent nausea and vomiting after your treatment, we encourage you to take your nausea medication as directed.  BELOW ARE SYMPTOMS THAT SHOULD BE REPORTED IMMEDIATELY: *FEVER GREATER THAN 100.4 F (38 C) OR HIGHER *CHILLS OR SWEATING *NAUSEA AND VOMITING THAT IS NOT  CONTROLLED WITH YOUR NAUSEA MEDICATION *UNUSUAL SHORTNESS OF BREATH *UNUSUAL BRUISING OR BLEEDING *URINARY PROBLEMS (pain or burning when urinating, or frequent urination) *BOWEL PROBLEMS (unusual diarrhea, constipation, pain near the anus) TENDERNESS IN MOUTH AND THROAT WITH OR WITHOUT PRESENCE OF ULCERS (sore throat, sores in mouth, or a toothache) UNUSUAL RASH, SWELLING OR PAIN  UNUSUAL VAGINAL DISCHARGE OR ITCHING   Items with * indicate a potential emergency and should be followed up as soon as possible or go to the Emergency Department if any problems should occur.  Please show the CHEMOTHERAPY ALERT CARD or IMMUNOTHERAPY ALERT CARD at check-in to the Emergency Department and triage nurse.  Should you have questions after your visit or need to cancel or reschedule your appointment, please contact [facility] - A DEPT OF [facility]  Dept: [phone]  and follow the prompts.  Office hours are 8:00 a.m. to 4:30 p.m. Monday - Friday. Please note that voicemails left after 4:00 p.m. may not be returned until the following business day.  We are closed weekends and major holidays. You have access to a nurse at all times for urgent questions. Please call the main number to the clinic Dept: [phone] and follow the prompts.   For any non-urgent questions, you may also contact your provider using MyChart. We now offer e-Visits for anyone 55 and older to request care online for non-urgent symptoms. For details visit [email or web address].   Also download the MyChart app! Go to the app store, search "MyChart", open the app, select [facility], and log in with your MyChart username and password.

## 2023-09-13 ENCOUNTER — Inpatient Hospital Stay: Attending: Nurse Practitioner

## 2023-09-13 VITALS — BP 100/63 | HR 65 | Temp 97.2°F | Resp 18 | Ht 62.0 in | Wt 142.5 lb

## 2023-09-13 DIAGNOSIS — C7A098 Malignant carcinoid tumors of other sites: Secondary | ICD-10-CM | POA: Diagnosis present

## 2023-09-13 DIAGNOSIS — Z79899 Other long term (current) drug therapy: Secondary | ICD-10-CM | POA: Diagnosis not present

## 2023-09-13 DIAGNOSIS — C7B8 Other secondary neuroendocrine tumors: Secondary | ICD-10-CM

## 2023-09-13 MED ORDER — OCTREOTIDE ACETATE 30 MG IM KIT
60.0000 mg | PACK | Freq: Once | INTRAMUSCULAR | Status: AC
  Administered 2023-09-13: 60 mg via INTRAMUSCULAR
  Filled 2023-09-13: qty 2

## 2023-09-13 NOTE — Patient Instructions (Signed)
 Immune Globulin  Injection What is this medication? IMMUNE GLOBULIN  (im MUNE GLOB yoo lin) treats many immune system conditions. It works by Designer, multimedia extra antibodies. Antibodies are proteins made by the immune system that help protect the body. This medicine may be used for other purposes; ask your health care provider or pharmacist if you have questions. COMMON BRAND NAME(S): ASCENIV, Baygam, BIVIGAM, Carimune, Carimune NF, cutaquig, Cuvitru, Flebogamma, Flebogamma DIF, GamaSTAN, GamaSTAN S/D, Gamimune N, Gammagard, Gammagard S/D, Gammaked, Gammaplex, Gammar-P IV, Gamunex, Gamunex-C, Hizentra, Iveegam, Iveegam EN, Octagam, Panglobulin, Panglobulin NF, panzyga, Polygam S/D, Privigen , Sandoglobulin, Venoglobulin-S, Vigam, Vivaglobulin, Xembify What should I tell my care team before I take this medication? They need to know if you have any of these conditions: Blood clotting disorder Condition where you have excess fluid in your body, such as heart failure or edema Dehydration Diabetes Have had blood clots Heart disease Immune system conditions Kidney disease Low levels of IgA Recent or upcoming vaccine An unusual or allergic reaction to immune globulin , other medications, foods, dyes, or preservatives Pregnant or trying to get pregnant Breastfeeding How should I use this medication? This medication is infused into a vein or under the skin. It may also be injected into a muscle. It is usually given by your care team in a hospital or clinic setting. It may also be given at home. If you get this medication at home, you will be taught how to prepare and give it. Take it as directed on the prescription label. Keep taking it unless your care team tells you to stop. It is important that you put your used needles and syringes in a special sharps container. Do not put them in a trash can. If you do not have a sharps container, call your pharmacist or care team to get one. Talk to your care team  about the use of this medication in children. While it may be given to children for selected conditions, precautions do apply. Overdosage: If you think you have taken too much of this medicine contact a poison control center or emergency room at once. NOTE: This medicine is only for you. Do not share this medicine with others. What if I miss a dose? If you get this medication at the hospital or clinic: It is important not to miss your dose. Call your care team if you are unable to keep an appointment. If you give yourself this medication at home: If you miss a dose, take it as soon as you can. Then continue your normal schedule. If it is almost time for your next dose, take only that dose. Do not take double or extra doses. Call your care team with questions. What may interact with this medication? Live virus vaccines This list may not describe all possible interactions. Give your health care provider a list of all the medicines, herbs, non-prescription drugs, or dietary supplements you use. Also tell them if you smoke, drink alcohol , or use illegal drugs. Some items may interact with your medicine. What should I watch for while using this medication? Your condition will be monitored carefully while you are receiving this medication. Tell your care team if your symptoms do not start to get better or if they get worse. You may need blood work done while you are taking this medication. This medication increases the risk of blood clots. People with heart, blood vessel, or blood clotting conditions are more likely to develop a blood clot. Other risk factors include advanced age, estrogen use, tobacco  use, lack of movement, and being overweight. This medication can decrease the response to a vaccine. If you need to get vaccinated, tell your care team if you have received this medication within the last year. Extra booster doses may be needed. Talk to your care team to see if a different vaccination schedule  is needed. This medication is made from donated human blood. There is a small risk it may contain bacteria or viruses, such as hepatitis or HIV. All products are processed to kill most bacteria and viruses. Talk to your care team if you have questions about the risk of infection. If you have diabetes, talk to your care team about which device you should use to check your blood sugar. This medication may cause some devices to report falsely high blood sugar levels. This may cause you to react by not treating a low blood sugar level or by giving an insulin  dose that was not needed. This can cause severe low blood sugar levels. What side effects may I notice from receiving this medication? Side effects that you should report to your care team as soon as possible: Allergic reactions--skin rash, itching, hives, swelling of the face, lips, tongue, or throat Blood clot--pain, swelling, or warmth in the leg, shortness of breath, chest pain Fever, neck pain or stiffness, sensitivity to light, headache, nausea, vomiting, confusion, which may be signs of meningitis Hemolytic anemia--unusual weakness or fatigue, dizziness, headache, trouble breathing, dark urine, yellowing skin or eyes Kidney injury--decrease in the amount of urine, swelling of the ankles, hands, or feet Low sodium level--muscle weakness, fatigue, dizziness, headache, confusion Shortness of breath or trouble breathing, cough, unusual weakness or fatigue, blue skin or lips Side effects that usually do not require medical attention (report these to your care team if they continue or are bothersome): Chills Diarrhea Fever Headache Nausea This list may not describe all possible side effects. Call your doctor for medical advice about side effects. You may report side effects to FDA at 1-800-FDA-1088. Where should I keep my medication? Keep out of the reach of children and pets. You will be instructed on how to store this medication. Get rid of  any unused medication after the expiration date. To get rid of medications that are no longer needed or have expired: Take the medication to a medication take-back program. Check with your pharmacy or law enforcement to find a location. If you cannot return the medication, ask your pharmacist or care team how to get rid of this medication safely. NOTE: This sheet is a summary. It may not cover all possible information. If you have questions about this medicine, talk to your doctor, pharmacist, or health care provider.  2025 Elsevier/Gold Standard (2023-03-14 00:00:00)

## 2023-10-07 ENCOUNTER — Other Ambulatory Visit: Payer: Self-pay | Admitting: *Deleted

## 2023-10-07 DIAGNOSIS — C7A8 Other malignant neuroendocrine tumors: Secondary | ICD-10-CM

## 2023-10-11 ENCOUNTER — Inpatient Hospital Stay

## 2023-10-11 ENCOUNTER — Inpatient Hospital Stay (HOSPITAL_BASED_OUTPATIENT_CLINIC_OR_DEPARTMENT_OTHER): Admitting: Oncology

## 2023-10-11 VITALS — BP 96/70 | HR 65 | Temp 97.6°F | Resp 17 | Wt 144.2 lb

## 2023-10-11 DIAGNOSIS — C7A098 Malignant carcinoid tumors of other sites: Secondary | ICD-10-CM | POA: Diagnosis not present

## 2023-10-11 DIAGNOSIS — C7A8 Other malignant neuroendocrine tumors: Secondary | ICD-10-CM | POA: Diagnosis not present

## 2023-10-11 DIAGNOSIS — C7B8 Other secondary neuroendocrine tumors: Secondary | ICD-10-CM

## 2023-10-11 MED ORDER — OCTREOTIDE ACETATE 30 MG IM KIT
60.0000 mg | PACK | Freq: Once | INTRAMUSCULAR | Status: AC
  Administered 2023-10-11: 60 mg via INTRAMUSCULAR
  Filled 2023-10-11: qty 2

## 2023-10-11 NOTE — Patient Instructions (Signed)
 [facility] - A DEPT OF [facility]  Discharge Instructions: Thank you for choosing [facility] to provide your oncology and hematology care.   If you have a lab appointment with the Cancer Center, please go directly to the Cancer Center and check in at the registration area.   Wear comfortable clothing and clothing appropriate for easy access to any Portacath or PICC line.   We strive to give you quality time with your provider. You may need to reschedule your appointment if you arrive late (15 or more minutes).  Arriving late affects you and other patients whose appointments are after yours.  Also, if you miss three or more appointments without notifying the office, you may be dismissed from the clinic at the provider's discretion.      For prescription refill requests, have your pharmacy contact our office and allow 72 hours for refills to be completed.    Today you received the following chemotherapy and/or immunotherapy agents Sandostation.  Octreotide  Injection Solution What is this medication? OCTREOTIDE  (ok TREE oh tide) treats high levels of growth hormone (acromegaly). It works by reducing the amount of growth hormone your body makes. This reduces symptoms and the risk of health problems caused by too much growth hormone, such as diabetes and heart disease. It may also be used to treat diarrhea caused by neuroendocrine tumors. It works by slowing down the release of serotonin from the tumor cells. This reduces the number of bowel movements you have. This medicine may be used for other purposes; ask your health care provider or pharmacist if you have questions. COMMON BRAND NAME(S): Bynfezia, Sandostatin  What should I tell my care team before I take this medication? They need to know if you have any of these conditions: Diabetes Gallbladder disease Heart disease Kidney disease Liver disease Pancreatic disease Thyroid disease An  unusual or allergic reaction to octreotide , other medications, foods, dyes, or preservatives Pregnant or trying to get pregnant Breastfeeding How should I use this medication? This medication is injected under the skin or into a vein. It is usually given by your care team in a hospital or clinic setting. If you get this medication at home, you will be taught how to prepare and give it. Use exactly as directed. Take it as directed on the prescription label at the same time every day. Keep taking it unless your care team tells you to stop. Allow the injection solution to come to room temperature before use. Do not warm it artificially. It is important that you put your used needles and syringes in a special sharps container. Do not put them in a trash can. If you do not have a sharps container, call your pharmacist or care team to get one. Talk to your care team about the use of this medication in children. Special care may be needed. Overdosage: If you think you have taken too much of this medicine contact a poison control center or emergency room at once. NOTE: This medicine is only for you. Do not share this medicine with others. What if I miss a dose? If you miss a dose, take it as soon as you can. If it is almost time for your next dose, take only that dose. Do not take double or extra doses. What may interact with this medication? Bromocriptine Certain medications for blood pressure, heart disease, irregular heartbeat Cyclosporine Diuretics Medications for diabetes, including insulin Quinidine This list may not describe all possible  interactions. Give your health care provider a list of all the medicines, herbs, non-prescription drugs, or dietary supplements you use. Also tell them if you smoke, drink alcohol, or use illegal drugs. Some items may interact with your medicine. What should I watch for while using this medication? Visit your care team for regular checks on your progress. Tell  your care team if your symptoms do not start to get better or if they get worse. To help reduce irritation at the injection site, use a different site for each injection and make sure the solution is at room temperature before use. This medication may cause decreases in blood sugar. Signs of low blood sugar include chills, cool, pale skin or cold sweats, drowsiness, extreme hunger, fast heartbeat, headache, nausea, nervousness or anxiety, shakiness, trembling, unsteadiness, tiredness, or weakness. Contact your care team right away if you experience any of these symptoms. This medication may increase blood sugar. The risk may be higher in patients who already have diabetes. Ask your care team what you can do to lower your risk of diabetes while taking this medication. You should make sure you get enough vitamin B12 while you are taking this medication. Discuss the foods you eat and the vitamins you take with your care team. What side effects may I notice from receiving this medication? Side effects that you should report to your care team as soon as possible: Allergic reactions--skin rash, itching, hives, swelling of the face, lips, tongue, or throat Gallbladder problems--severe stomach pain, nausea, vomiting, fever Heart rhythm changes--fast or irregular heartbeat, dizziness, feeling faint or lightheaded, chest pain, trouble breathing High blood sugar (hyperglycemia)--increased thirst or amount of urine, unusual weakness or fatigue, blurry vision Low blood sugar (hypoglycemia)--tremors or shaking, anxiety, sweating, cold or clammy skin, confusion, dizziness, rapid heartbeat Low thyroid levels (hypothyroidism)--unusual weakness or fatigue, increased sensitivity to cold, constipation, hair loss, dry skin, weight gain, feelings of depression Low vitamin B12 level--pain, tingling, or numbness in the hands or feet, muscle weakness, dizziness, confusion, trouble concentrating Oily or light-colored stools,  diarrhea, bloating, weight loss Pancreatitis--severe stomach pain that spreads to your back or gets worse after eating or when touched, fever, nausea, vomiting Slow heartbeat--dizziness, feeling faint or lightheaded, confusion, trouble breathing, unusual weakness or fatigue Side effects that usually do not require medical attention (report these to your care team if they continue or are bothersome): Diarrhea Dizziness Headache Nausea Pain, redness, or irritation at injection site Stomach pain This list may not describe all possible side effects. Call your doctor for medical advice about side effects. You may report side effects to FDA at 1-800-FDA-1088. Where should I keep my medication? Keep out of the reach of children and pets. Store in the refrigerator. Protect from light. Allow to come to room temperature naturally. Do not use artificial heat. If protected from light, the injection may be stored between 20 and 30 degrees C (70 and 86 degrees F) for 14 days. After the initial use, throw away any unused portion of a multiple dose vial after 14 days. Get rid of any unused portions of the ampules after use. To get rid of medications that are no longer needed or have expired: Take the medication to a medication take-back program. Ask your pharmacy or law enforcement to find a location. If you cannot return the medication, ask your pharmacist or care team how to get rid of the medication safely. NOTE: This sheet is a summary. It may not cover all possible information. If you have  questions about this medicine, talk to your doctor, pharmacist, or health care provider.  2024 Elsevier/Gold Standard (2022-12-10 00:00:00)      To help prevent nausea and vomiting after your treatment, we encourage you to take your nausea medication as directed.  BELOW ARE SYMPTOMS THAT SHOULD BE REPORTED IMMEDIATELY: *FEVER GREATER THAN 100.4 F (38 C) OR HIGHER *CHILLS OR SWEATING *NAUSEA AND VOMITING THAT IS NOT  CONTROLLED WITH YOUR NAUSEA MEDICATION *UNUSUAL SHORTNESS OF BREATH *UNUSUAL BRUISING OR BLEEDING *URINARY PROBLEMS (pain or burning when urinating, or frequent urination) *BOWEL PROBLEMS (unusual diarrhea, constipation, pain near the anus) TENDERNESS IN MOUTH AND THROAT WITH OR WITHOUT PRESENCE OF ULCERS (sore throat, sores in mouth, or a toothache) UNUSUAL RASH, SWELLING OR PAIN  UNUSUAL VAGINAL DISCHARGE OR ITCHING   Items with * indicate a potential emergency and should be followed up as soon as possible or go to the Emergency Department if any problems should occur.  Please show the CHEMOTHERAPY ALERT CARD or IMMUNOTHERAPY ALERT CARD at check-in to the Emergency Department and triage nurse.  Should you have questions after your visit or need to cancel or reschedule your appointment, please contact [facility] - A DEPT OF [facility]  Dept: [phone]  and follow the prompts.  Office hours are 8:00 a.m. to 4:30 p.m. Monday - Friday. Please note that voicemails left after 4:00 p.m. may not be returned until the following business day.  We are closed weekends and major holidays. You have access to a nurse at all times for urgent questions. Please call the main number to the clinic Dept: [phone] and follow the prompts.   For any non-urgent questions, you may also contact your provider using MyChart. We now offer e-Visits for anyone 55 and older to request care online for non-urgent symptoms. For details visit [email or web address].   Also download the MyChart app! Go to the app store, search "MyChart", open the app, select [facility], and log in with your MyChart username and password.

## 2023-10-11 NOTE — Progress Notes (Signed)
 [facility] OFFICE PROGRESS NOTE   Diagnosis: Carcinoid tumor  INTERVAL HISTORY:   [facility] Brown is as Sally Brown.  She continues monthly Sandostatin .  No fever, flushing, or diarrhea.  She feels well.  No right hip pain.  No new complaint.  Objective:  Vital signs in last 24 hours:  Blood pressure 96/70, pulse 65, temperature 97.6 F (36.4 C), temperature source Temporal, resp. rate 17, weight 144 lb 3.2 oz (65.4 kg), last menstrual period 05/17/2017, SpO2 97%. Resp: Lungs clear bilaterally Cardio: Regular rate and rhythm GI: No hepatosplenomegaly Vascular: No leg edema  Lab Results:  Lab Results  Component Value Date   WBC 6.2 03/01/2023   HGB 12.8 03/01/2023   HCT 38.0 03/01/2023   MCV 90.5 03/01/2023   PLT 209 03/01/2023   NEUTROABS 3.4 03/01/2023    CMP  Lab Results  Component Value Date   NA 139 03/29/2023   K 4.2 03/29/2023   CL 106 03/29/2023   CO2 26 03/29/2023   GLUCOSE 130 (H) 03/29/2023   BUN 18 03/29/2023   CREATININE 0.77 03/29/2023   CALCIUM 9.2 03/29/2023   PROT 7.1 03/29/2023   ALBUMIN 4.4 03/29/2023   AST 30 03/29/2023   ALT 33 03/29/2023   ALKPHOS 91 03/29/2023   BILITOT 0.4 03/29/2023   GFRNONAA >60 03/29/2023   GFRAA >60 10/08/2019     Medications: I have reviewed the patient's current medications.   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   06/07/2018 MRI of the liver-numerous hepatic lesions scattered throughout all aspects of the liver.  06/14/2018 PET scan-enlarged and hypermetabolic perihepatic and portacaval lymph nodes; diffuse heterogeneous uptake within the liver with subtle areas of mildly increased FDG uptake corresponding to suspicious lesions identified on MRI.   06/21/2018 biopsy of a right liver lobe mass-low-grade neuroendocrine tumor consistent with metastasis, positive CD56, chromogranin, synaptophysin, CDX-2 and cytokeratin  AE1/AE3. 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  Single focus of uptake in the proximal right humerus.  No clear primary tumor identified. Normal chromogranin A 07/05/2018 and normal 24-hour urine 5 HIAA 08/03/2018 Monthly lanreotide beginning 08/03/2018 CT 12/15/2018-small hepatic metastases, mildly improved, slight increase in size of a porta hepatis node CT abdomen/pelvis 07/02/2019-some of the liver lesions are slightly larger, others stable or smaller, slight increase in splenic lesions, minimal increase in abdominal adenopathy Cycle 1 Lutathera  08/14/2019 Cycle 2 Lutathera  10/08/2019 Cycle 3 Lutathera  12/03/2019 Cycle 4 Lutathera  01/29/2020 Netspot  03/11/2020-marked reduction in activity within hepatic metastases with only 1 remaining measurable lesion, decrease in size and radiotracer activity involving periportal and peripancreatic lymph nodes, decreased radiotracer activity at a subcarinal node and resolution of activity at a left supraclavicular node, resolution of focal metabolic activity in the right humerus, near complete resolution of activity in the left T3 transverse process, decreased activity involving a right acetabular lesion, no evidence of new or progressive disease Monthly lanreotide continued Netspot  12/22/2020 (outside facility)-subjective stability of disease-very small left hepatic lobe and caudate metastatic lesions.  Additional hepatic metastatic lesions not completely excluded.  Metastatic hepatic hilar/portacaval node.  Low suspicion subcarinal lymph node. EUS 01/08/2021-suspicion of Barrett's esophagus 35 cm from the incisors,, no gross lesion in the duodenum or visualized jejunum, no significant pathology in the pancreas, common bile duct, no malignant appearing lymph nodes in the celiac,  peripancreatic, porta hepatis region Netspot  12/28/2021-2  radiotracer avid lesions remain in the liver, small with activity minimally above background, no evidence of disease progression.  No evidence of neuroendocrine tumor elsewhere including mediastinal lymph nodes, periportal lymph nodes, and skeletal metastases Netspot  12/29/2022: New small radiotracer avid mediastinal/upper abdominal lymph nodes, stable dominant left hepatic lobe lesion with a new left hepatic lesion, faint activity in the posterior right acetabulum-indeterminate EGD 01/11/2023: Small erosions in the gastric biopsy, localized mild erythema in the gastric antrum-stomach biopsy revealed well-differentiated neuroendocrine tumor (2 mm) within moderate chronic inactive gastritis negative for H. pylori, CDX2 positive Sandostatin  60 mg every 4 weeks starting 03/01/2023 Netspot  06/23/2023: Stable pattern of metastatic disease, slight increase in radiotracer activity involving small metastatic mediastinal lymph nodes, stable activity in the liver, upper abdominal adenopathy, and a right acetabulum lesion Sandostatin  60 mg every 4 weeks continued 3.   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 Ebbie Bilateral breast MRI 11/11/2020-no MRI evidence for malignancy in either breast. Bilateral mammogram 06/15/2021-no evidence of malignancy. Bilateral breast MRI 12/07/2021-no evidence of malignancy, 1 cm enhancing mass at the left liver unchanged-compatible with known neuroendocrine metastasis 4.   Hypercholesterolemia 5.   Nausea and vomiting following cycle 1 Lutathera  6.  Barrett's esophagus-changes at the GE junction suspicious for Barrett's esophagus on EGD 01/11/2023: Biopsy-Barrett's mucosa, negative for dysplasia       Disposition: Sally [facility] Sally Brown appears stable.  The plan is to continue monthly Sandostatin .  Will follow-up on the chromogranin a level from today.  She will be Sally Brown for restaging  dotatate scan prior to an office visit in December.  Arley Hof, MD  10/11/2023  9:36 AM

## 2023-10-12 LAB — CHROMOGRANIN A: Chromogranin A (ng/mL): 113 ng/mL — ABNORMAL HIGH (ref 0.0–101.8)

## 2023-10-24 ENCOUNTER — Encounter: Payer: Self-pay | Admitting: Oncology

## 2023-10-28 ENCOUNTER — Encounter: Payer: Self-pay | Admitting: Oncology

## 2023-11-08 ENCOUNTER — Inpatient Hospital Stay: Attending: Nurse Practitioner

## 2023-11-08 VITALS — BP 101/76 | HR 70 | Temp 97.2°F | Resp 18

## 2023-11-08 DIAGNOSIS — Z79899 Other long term (current) drug therapy: Secondary | ICD-10-CM | POA: Diagnosis not present

## 2023-11-08 DIAGNOSIS — C7B8 Other secondary neuroendocrine tumors: Secondary | ICD-10-CM

## 2023-11-08 DIAGNOSIS — C7A098 Malignant carcinoid tumors of other sites: Secondary | ICD-10-CM | POA: Diagnosis present

## 2023-11-08 MED ORDER — OCTREOTIDE ACETATE 30 MG IM KIT
60.0000 mg | PACK | Freq: Once | INTRAMUSCULAR | Status: AC
  Administered 2023-11-08: 60 mg via INTRAMUSCULAR
  Filled 2023-11-08: qty 2

## 2023-11-08 NOTE — Patient Instructions (Signed)
 [facility] - A DEPT OF [place]  Discharge Instructions: Thank you for choosing [facility] to provide your oncology and hematology care.   If you have a lab appointment with the Cancer Center, please go directly to the Cancer Center and check in at the registration area.   Wear comfortable clothing and clothing appropriate for easy access to any Portacath or PICC line.   We strive to give you quality time with your provider. You may need to reschedule your appointment if you arrive late (15 or more minutes).  Arriving late affects you and other patients whose appointments are after yours.  Also, if you miss three or more appointments without notifying the office, you may be dismissed from the clinic at the provider's discretion.      For prescription refill requests, have your pharmacy contact our office and allow 72 hours for refills to be completed.    Today you received the following chemotherapy and/or immunotherapy agents: sandostatin       To help prevent nausea and vomiting after your treatment, we encourage you to take your nausea medication as directed.  BELOW ARE SYMPTOMS THAT SHOULD BE REPORTED IMMEDIATELY: *FEVER GREATER THAN 100.4 F (38 C) OR HIGHER *CHILLS OR SWEATING *NAUSEA AND VOMITING THAT IS NOT CONTROLLED WITH YOUR NAUSEA MEDICATION *UNUSUAL SHORTNESS OF BREATH *UNUSUAL BRUISING OR BLEEDING *URINARY PROBLEMS (pain or burning when urinating, or frequent urination) *BOWEL PROBLEMS (unusual diarrhea, constipation, pain near the anus) TENDERNESS IN MOUTH AND THROAT WITH OR WITHOUT PRESENCE OF ULCERS (sore throat, sores in mouth, or a toothache) UNUSUAL RASH, SWELLING OR PAIN  UNUSUAL VAGINAL DISCHARGE OR ITCHING   Items with * indicate a potential emergency and should be followed up as soon as possible or go to the Emergency Department if any problems should occur.  Please show the CHEMOTHERAPY ALERT CARD or  IMMUNOTHERAPY ALERT CARD at check-in to the Emergency Department and triage nurse.  Should you have questions after your visit or need to cancel or reschedule your appointment, please contact [facility] - A DEPT OF [facility]  Dept: [phone]  and follow the prompts.  Office hours are 8:00 a.m. to 4:30 p.m. Monday - Friday. Please note that voicemails left after 4:00 p.m. may not be returned until the following business day.  We are closed weekends and major holidays. You have access to a nurse at all times for urgent questions. Please call the main number to the clinic Dept: [phone] and follow the prompts.   For any non-urgent questions, you may also contact your provider using MyChart. We now offer e-Visits for anyone 42 and older to request care online for non-urgent symptoms. For details visit [email or web address].   Also download the MyChart app! Go to the app store, search MyChart, open the app, select [facility], and log in with your MyChart username and password.

## 2023-12-06 ENCOUNTER — Inpatient Hospital Stay: Attending: Nurse Practitioner

## 2023-12-06 VITALS — BP 114/72 | HR 69 | Temp 97.8°F | Resp 18 | Wt 144.1 lb

## 2023-12-06 DIAGNOSIS — Z79899 Other long term (current) drug therapy: Secondary | ICD-10-CM | POA: Insufficient documentation

## 2023-12-06 DIAGNOSIS — C7A098 Malignant carcinoid tumors of other sites: Secondary | ICD-10-CM | POA: Insufficient documentation

## 2023-12-06 DIAGNOSIS — C7A8 Other malignant neuroendocrine tumors: Secondary | ICD-10-CM

## 2023-12-06 MED ORDER — OCTREOTIDE ACETATE 30 MG IM KIT
60.0000 mg | PACK | Freq: Once | INTRAMUSCULAR | Status: AC
  Administered 2023-12-06: 60 mg via INTRAMUSCULAR
  Filled 2023-12-06: qty 2

## 2023-12-06 NOTE — Patient Instructions (Signed)
 [facility] - A DEPT OF [place]  Discharge Instructions: Thank you for choosing [facility] to provide your oncology and hematology care.   If you have a lab appointment with the Cancer Center, please go directly to the Cancer Center and check in at the registration area.   Wear comfortable clothing and clothing appropriate for easy access to any Portacath or PICC line.   We strive to give you quality time with your provider. You may need to reschedule your appointment if you arrive late (15 or more minutes).  Arriving late affects you and other patients whose appointments are after yours.  Also, if you miss three or more appointments without notifying the office, you may be dismissed from the clinic at the provider's discretion.      For prescription refill requests, have your pharmacy contact our office and allow 72 hours for refills to be completed.    Today you received the following chemotherapy and/or immunotherapy agents: sandostatin       To help prevent nausea and vomiting after your treatment, we encourage you to take your nausea medication as directed.  BELOW ARE SYMPTOMS THAT SHOULD BE REPORTED IMMEDIATELY: *FEVER GREATER THAN 100.4 F (38 C) OR HIGHER *CHILLS OR SWEATING *NAUSEA AND VOMITING THAT IS NOT CONTROLLED WITH YOUR NAUSEA MEDICATION *UNUSUAL SHORTNESS OF BREATH *UNUSUAL BRUISING OR BLEEDING *URINARY PROBLEMS (pain or burning when urinating, or frequent urination) *BOWEL PROBLEMS (unusual diarrhea, constipation, pain near the anus) TENDERNESS IN MOUTH AND THROAT WITH OR WITHOUT PRESENCE OF ULCERS (sore throat, sores in mouth, or a toothache) UNUSUAL RASH, SWELLING OR PAIN  UNUSUAL VAGINAL DISCHARGE OR ITCHING   Items with * indicate a potential emergency and should be followed up as soon as possible or go to the Emergency Department if any problems should occur.  Please show the CHEMOTHERAPY ALERT CARD or  IMMUNOTHERAPY ALERT CARD at check-in to the Emergency Department and triage nurse.  Should you have questions after your visit or need to cancel or reschedule your appointment, please contact [facility] - A DEPT OF [facility]  Dept: [phone]  and follow the prompts.  Office hours are 8:00 a.m. to 4:30 p.m. Monday - Friday. Please note that voicemails left after 4:00 p.m. may not be returned until the following business day.  We are closed weekends and major holidays. You have access to a nurse at all times for urgent questions. Please call the main number to the clinic Dept: [phone] and follow the prompts.   For any non-urgent questions, you may also contact your provider using MyChart. We now offer e-Visits for anyone 42 and older to request care online for non-urgent symptoms. For details visit [email or web address].   Also download the MyChart app! Go to the app store, search MyChart, open the app, select [facility], and log in with your MyChart username and password.

## 2023-12-13 ENCOUNTER — Encounter: Payer: Self-pay | Admitting: Oncology

## 2024-01-02 LAB — LAB REPORT - SCANNED
A1c: 6.8
Creatinine, POC: 48 mg/dL
EGFR: 92

## 2024-01-03 ENCOUNTER — Ambulatory Visit

## 2024-01-03 ENCOUNTER — Other Ambulatory Visit: Admitting: Oncology

## 2024-01-09 ENCOUNTER — Encounter (HOSPITAL_COMMUNITY)
Admission: RE | Admit: 2024-01-09 | Discharge: 2024-01-09 | Disposition: A | Discharge status: Home / Self Care | Source: Ambulatory Visit | Attending: Oncology | Admitting: Oncology

## 2024-01-09 DIAGNOSIS — C7B8 Other secondary neuroendocrine tumors: Secondary | ICD-10-CM | POA: Diagnosis present

## 2024-01-09 DIAGNOSIS — C7A8 Other malignant neuroendocrine tumors: Secondary | ICD-10-CM | POA: Diagnosis present

## 2024-01-09 MED ORDER — COPPER CU 64 DOTATATE 1 MCI/ML IV SOLN
4.0000 | Freq: Once | INTRAVENOUS | Status: AC
  Administered 2024-01-09: 4.18 via INTRAVENOUS

## 2024-01-11 ENCOUNTER — Inpatient Hospital Stay: Attending: Nurse Practitioner | Admitting: Oncology

## 2024-01-11 ENCOUNTER — Inpatient Hospital Stay

## 2024-01-11 ENCOUNTER — Encounter: Payer: Self-pay | Admitting: *Deleted

## 2024-01-11 VITALS — BP 101/74 | HR 73 | Temp 97.8°F | Resp 18 | Ht 62.0 in | Wt 145.3 lb

## 2024-01-11 DIAGNOSIS — E7800 Pure hypercholesterolemia, unspecified: Secondary | ICD-10-CM | POA: Diagnosis not present

## 2024-01-11 DIAGNOSIS — C7B02 Secondary carcinoid tumors of liver: Secondary | ICD-10-CM | POA: Insufficient documentation

## 2024-01-11 DIAGNOSIS — C7B8 Other secondary neuroendocrine tumors: Secondary | ICD-10-CM

## 2024-01-11 DIAGNOSIS — C7B09 Secondary carcinoid tumors of other sites: Secondary | ICD-10-CM | POA: Insufficient documentation

## 2024-01-11 DIAGNOSIS — C7B03 Secondary carcinoid tumors of bone: Secondary | ICD-10-CM | POA: Insufficient documentation

## 2024-01-11 DIAGNOSIS — C7B01 Secondary carcinoid tumors of distant lymph nodes: Secondary | ICD-10-CM | POA: Insufficient documentation

## 2024-01-11 DIAGNOSIS — K2270 Barrett's esophagus without dysplasia: Secondary | ICD-10-CM | POA: Diagnosis not present

## 2024-01-11 DIAGNOSIS — Z86000 Personal history of in-situ neoplasm of breast: Secondary | ICD-10-CM | POA: Insufficient documentation

## 2024-01-11 DIAGNOSIS — C7A8 Other malignant neuroendocrine tumors: Secondary | ICD-10-CM

## 2024-01-11 DIAGNOSIS — C7A098 Malignant carcinoid tumors of other sites: Secondary | ICD-10-CM | POA: Diagnosis present

## 2024-01-11 MED ORDER — OCTREOTIDE ACETATE 30 MG IM KIT: 60.0000 mg | PACK | Freq: Once | INTRAMUSCULAR | Status: AC

## 2024-01-11 NOTE — Progress Notes (Signed)
 Patient scheduled with Dr Markham at [facility] on 01/17/24 at 4 pm, request sent to [facility] to upload PET Dototate scans from 6/25 and 12/25 for visit.

## 2024-01-11 NOTE — Progress Notes (Signed)
 "  [facility] OFF[facility] PROGRESS NOTE   Diagnosis: Carcinoid tumor  INTERVAL HISTORY:   Sally Brown returns as scheduled.  She generally feels well.  No diarrhea.  She had transient pain in the left leg.  This has resolved.  No other pain.  She last received Sandostatin  on 12/06/2023.  Objective:  Vital signs in last 24 hours:  Blood pressure 101/74, pulse 73, temperature 97.8 F (36.6 C), temperature source Temporal, resp. rate 18, height 5' 2 (1.575 m), weight 145 lb 4.8 oz (65.9 kg), last menstrual period 05/17/2017, SpO2 98%.   Lymphatics: 1/2-1 cm high medial mobile right axillary node, no other cervical, supraclavicular, axillary, or inguinal nodes Resp: Lungs clear bilaterally Cardio: Regular rate and rhythm GI: No hepatosplenomegaly, nontender Vascular: Edema Breasts: Bilateral breast without mass  Lab Results:  Lab Results  Component Value Date   WBC 6.2 03/01/2023   HGB 12.8 03/01/2023   HCT 38.0 03/01/2023   MCV 90.5 03/01/2023   PLT 209 03/01/2023   NEUTROABS 3.4 03/01/2023    CMP  Lab Results  Component Value Date   NA 139 03/29/2023   K 4.2 03/29/2023   CL 106 03/29/2023   CO2 26 03/29/2023   GLUCOSE 130 (H) 03/29/2023   BUN 18 03/29/2023   CREATININE 0.77 03/29/2023   CALCIUM 9.2 03/29/2023   PROT 7.1 03/29/2023   ALBUMIN 4.4 03/29/2023   AST 30 03/29/2023   ALT 33 03/29/2023   ALKPHOS 91 03/29/2023   BILITOT 0.4 03/29/2023   GFRNONAA >60 03/29/2023   GFRAA >60 10/08/2019    No results found for: CEA1, CEA, CAN199, CA125  Lab Results  Component Value Date   INR 1.1 06/21/2018   LABPROT 14.3 06/21/2018    Imaging:  NM PET DOTATATE SKULL BASE TO MID THIGH Result Date: 01/10/2024 EXAM: PET AND CT SKULL BASE TO MID THIGH 01/09/2024 02:58:29 PM TECHNIQUE: RADIOPHARMACEUTICAL: 4.18 mCi Cu-64 DOTATATE IV Uptake time 60 minutes. PET imaging was acquired from the base of the skull to the mid thighs. Non-contrast enhanced  computed tomography was obtained for attenuation correction and anatomic localization. COMPARISON: 06/23/2023 CLINICAL HISTORY: restaging carcinoid tumor FINDINGS: HEAD AND NECK: Right level II cervical lymph node measures 0.5 cm and has an SUV max of 6.2, axial image 24. On the previous exam this measured 0.5 cm with SUV max of 4.0. CHEST: Multiple tracer avid thoracic lymph nodes are again identified. -Index lymph node anterior to the trachea measures 0.7 cm with SUV max of 27.8. On the previous exam this measured 0.5 cm with SUV max of 14.0. -Index right paratracheal lymph node measures 0.9 cm with SUV max of 39.1, axial image 58. On the previous exam this measured 0.7 cm with SUV max of 24.7. -The subcarinal lymph node measures 0.9 cm with SUV max of 22.8. Previously 0.8 cm with SUV max of 28.4. Multiple tiny lung nodules are identified in both lungs, which are subjectively increased in multiplicity compared with the previous exam. -Index nodule within the posterior medial right lung base measures 5 mm with SUV max of 5.2, axial image 73. On the previous exam this measured 2 mm without significant tracer uptake. -The index nodule in the posterior left base measures 3 mm with an SUV max of 4.5, axial image 72. Previously 2 mm without significant tracer uptake. ABDOMEN AND PELVIS: Multifocal tracer-avid liver metastases are again noted. Bilobar liver metastases are again noted. Index lesions include: - Dominant lesion is in the anterior left  lobe of liver measuring approximately 3 cm with SUV max of 35.5, axial image 83. Previously 2 cm with SUV max of 31.3. - Index lesion within segment 5 measures 1.4 cm with SUV max of 19.2. Previously 1.4 cm with SUV max of 20.4. - Index lesion within lateral segment of left lobe of liver measures 1.2 cm with SUV max of 17.1, axial image at 90. Previously 1.2 cm with SUV max of 17.8. - Index lesion within the posterior right lobe measures 1.4 cm with an SUV max of 27.0, image  84. On the previous exam 1.4 cm with SUV max of 18.1. Multiple tracer-avid splenic lesions are identified. The index lesion within the anterior spleen measures 1.8 cm with an SUV max of 25.1, axial image 91. Previously 1.3 cm with SUV max of 23.8. Multiple tracer avid abdominal lymph nodes are again noted. -Lymph node conglomeration within the gastrohepatic ligament measures 2.3 cm with SUV max of 25.0, image 88. Previously 2.4 cm with SUV max of 27.8. -Right retroperitoneal lymph node measures 1 cm with SUV max of 23.3. Previously 1.1 cm with SUV max of 25.6. Physiologic activity within the gastrointestinal and genitourinary systems. BONES AND SOFT TISSUE: Scattered foci of increased uptake within the visualized osseous structures are noted including: -Faint focus of increased uptake within the T12 vertebra with an SUV max of 3.4, axial image 92. This is new compared with the previous exam. -There are 2 faint foci of increased uptake within the L5 vertebra with SUV max of 5.1. Previously SUV max was equal to 3.2. -Faint foci of increased uptake within the left side of sacrum has an SUV max of 3.2, image 139. New from previous exam. Previously referenced foci of increased uptake within the posterior right the acetabulum has an suv max of 3.3, previously 5.1. IMPRESSION: 1. Interval mixed response to therapy. 2. New multifocal faint foci of increased uptake within the skeletal structures worrisome for osseous metastasis. 3. Increased size and degree of tracer uptake compared with previous exam, with subjective increased multiplicity of multiple tiny lung nodules. 4. Similar tumor burden within the liver as detailed above. Electronically signed by: Waddell Calk MD 01/10/2024 02:04 PM EST RP Workstation: [id number]    Medications: I have reviewed the patient's current medications.   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   06/07/2018  MRI of the liver-numerous hepatic lesions scattered throughout all aspects of the liver.  06/14/2018 PET scan-enlarged and hypermetabolic perihepatic and portacaval lymph nodes; diffuse heterogeneous uptake within the liver with subtle areas of mildly increased FDG uptake corresponding to suspicious lesions identified on MRI.   06/21/2018 biopsy of a right liver lobe mass-low-grade neuroendocrine tumor consistent with metastasis, positive CD56, chromogranin, synaptophysin, CDX-2 and cytokeratin AE1/AE3. 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  Single focus of uptake in the proximal right humerus.  No clear primary tumor identified. Normal chromogranin A 07/05/2018 and normal 24-hour urine 5 HIAA 08/03/2018 Monthly lanreotide beginning 08/03/2018 CT 12/15/2018-small hepatic metastases, mildly improved, slight increase in size of a porta hepatis node CT abdomen/pelvis 07/02/2019-some of the liver lesions are slightly larger, others stable or smaller, slight increase in splenic lesions, minimal increase in abdominal adenopathy Cycle 1 Lutathera  08/14/2019 Cycle 2 Lutathera  10/08/2019 Cycle 3 Lutathera  12/03/2019 Cycle 4 Lutathera  01/29/2020 Netspot  03/11/2020-marked reduction in activity within hepatic metastases with only 1 remaining  measurable lesion, decrease in size and radiotracer activity involving periportal and peripancreatic lymph nodes, decreased radiotracer activity at a subcarinal node and resolution of activity at a left supraclavicular node, resolution of focal metabolic activity in the right humerus, near complete resolution of activity in the left T3 transverse process, decreased activity involving a right acetabular lesion, no evidence of new or progressive disease Monthly lanreotide continued Netspot  12/22/2020 (outside  facility)-subjective stability of disease-very small left hepatic lobe and caudate metastatic lesions.  Additional hepatic metastatic lesions not completely excluded.  Metastatic hepatic hilar/portacaval node.  Low suspicion subcarinal lymph node. EUS 01/08/2021-suspicion of Barrett's esophagus 35 cm from the incisors,, no gross lesion in the duodenum or visualized jejunum, no significant pathology in the pancreas, common bile duct, no malignant appearing lymph nodes in the celiac, peripancreatic, porta hepatis region Netspot  12/28/2021-2 radiotracer avid lesions remain in the liver, small with activity minimally above background, no evidence of disease progression.  No evidence of neuroendocrine tumor elsewhere including mediastinal lymph nodes, periportal lymph nodes, and skeletal metastases Netspot  12/29/2022: New small radiotracer avid mediastinal/upper abdominal lymph nodes, stable dominant left hepatic lobe lesion with a new left hepatic lesion, faint activity in the posterior right acetabulum-indeterminate EGD 01/11/2023: Small erosions in the gastric biopsy, localized mild erythema in the gastric antrum-stomach biopsy revealed well-differentiated neuroendocrine tumor (2 mm) within moderate chronic inactive gastritis negative for H. pylori, CDX2 positive Sandostatin  60 mg every 4 weeks starting 03/01/2023 Netspot  06/23/2023: Stable pattern of metastatic disease, slight increase in radiotracer activity involving small metastatic mediastinal lymph nodes, stable activity in the liver, upper abdominal adenopathy, and a right acetabulum lesion Sandostatin  60 mg every 4 weeks continued 01/09/2024 Dotatate PET: New multifocal foci of uptake in the bones, increased size and tracer uptake involving tiny lung nodules, stable liver lesions, multiple splenic lesions 3.   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 Ebbie Bilateral breast MRI 11/11/2020-no  MRI evidence for malignancy in either breast. Bilateral mammogram 06/15/2021-no evidence of malignancy. Bilateral breast MRI 12/07/2021-no evidence of malignancy, 1 cm enhancing mass at the left liver unchanged-compatible with known neuroendocrine metastasis 4.   Hypercholesterolemia 5.   Nausea and vomiting following cycle 1 Lutathera  6.  Barrett's esophagus-changes at the GE junction suspicious for Barrett's esophagus on EGD 01/11/2023: Biopsy-Barrett's mucosa, negative for dysplasia        Disposition: Sally Brown has metastatic carcinoid tumor.  She is currently maintained on every 4-week Sandostatin .  I reviewed the restaging dotatate PET findings and images with her.  There appears to be disease progression involving lung nodules and bone lesions.  We discussed treatment options.  Sandostatin  will be placed on hold.  She will be referred to Dr. Markham to consider salvage systemic treatment options.  He saw her earlier this year.  We discussed various systemic options including Xeloda/temozolomide, cabozantinib, and everolimus.  We discussed referral for a clinical trial.  She will return for an office visit after she sees Dr. Markham.  Arley Hof, MD  01/11/2024  10:43 AM   "

## 2024-01-18 ENCOUNTER — Other Ambulatory Visit: Payer: Self-pay | Admitting: *Deleted

## 2024-01-18 DIAGNOSIS — C7B8 Other secondary neuroendocrine tumors: Secondary | ICD-10-CM

## 2024-01-18 NOTE — Progress Notes (Signed)
 Received notification from NP that Dr. Markham has requested a CT C/A/P with contrast to assess her liver metastasis more clearly. Placed order and requested images be Powershared w/[facility] in order comment. Notified managed care to work on PA for CT

## 2024-01-25 ENCOUNTER — Ambulatory Visit (HOSPITAL_COMMUNITY)
Admission: RE | Admit: 2024-01-25 | Discharge: 2024-01-25 | Disposition: A | Discharge status: Home / Self Care | Source: Ambulatory Visit | Attending: Nurse Practitioner | Admitting: Nurse Practitioner

## 2024-01-25 DIAGNOSIS — C7A8 Other malignant neuroendocrine tumors: Secondary | ICD-10-CM | POA: Insufficient documentation

## 2024-01-25 DIAGNOSIS — C7B8 Other secondary neuroendocrine tumors: Secondary | ICD-10-CM | POA: Diagnosis present

## 2024-01-25 LAB — POCT I-STAT CREATININE: Creatinine, Ser: 0.8 mg/dL (ref 0.44–1.00)

## 2024-01-25 MED ORDER — IOHEXOL 300 MG/ML  SOLN
100.0000 mL | Freq: Once | INTRAMUSCULAR | Status: AC | PRN
  Administered 2024-01-25: 100 mL via INTRAVENOUS

## 2024-02-02 ENCOUNTER — Telehealth: Payer: Self-pay | Admitting: *Deleted

## 2024-02-02 NOTE — Telephone Encounter (Signed)
 Per Olam Ned, NP  Copy report and send images to Dr Markham : sent via routing and requested that images be powershared to [facility].  Per Dr Cloretta --- Please ask radiology why arterial phase imaging was not performed   Response from Radiology: I spoke with Radiology and they said that because the scan was ordered w and not w w/o then the arterial phase isn't typically done unless you specify within the order.  He said that if you want that done then you need to place and order and have the patient come back and they can get it done.    Communicated to the patient, no further questions. Let her know the CTs show no new sites of metastatic disease.

## 2024-02-08 ENCOUNTER — Inpatient Hospital Stay

## 2024-02-08 ENCOUNTER — Inpatient Hospital Stay: Attending: Nurse Practitioner | Admitting: Nurse Practitioner

## 2024-02-08 ENCOUNTER — Encounter: Payer: Self-pay | Admitting: Nurse Practitioner

## 2024-02-08 VITALS — BP 97/71 | HR 74 | Temp 97.8°F | Resp 18 | Ht 62.0 in | Wt 141.8 lb

## 2024-02-08 DIAGNOSIS — C7B8 Other secondary neuroendocrine tumors: Secondary | ICD-10-CM | POA: Diagnosis not present

## 2024-02-08 DIAGNOSIS — C7A8 Other malignant neuroendocrine tumors: Secondary | ICD-10-CM

## 2024-02-08 LAB — HEPATIC FUNCTION PANEL
ALT: 114 U/L — ABNORMAL HIGH (ref 0–44)
AST: 79 U/L — ABNORMAL HIGH (ref 15–41)
Albumin: 4.2 g/dL (ref 3.5–5.0)
Alkaline Phosphatase: 491 U/L — ABNORMAL HIGH (ref 38–126)
Bilirubin, Direct: 0.1 mg/dL (ref 0.0–0.2)
Indirect Bilirubin: 0.5 mg/dL (ref 0.3–0.9)
Total Bilirubin: 0.6 mg/dL (ref 0.0–1.2)
Total Protein: 8.1 g/dL (ref 6.5–8.1)

## 2024-02-08 NOTE — Progress Notes (Signed)
 " [facility] OFFICE PROGRESS NOTE   Diagnosis:  Carcinoid tumor  INTERVAL HISTORY:   Ms. Ketner returns as scheduled.  Overall feels well.  Thinks she may have had food poisoning recently.  Those symptoms have resolved.  Bowels moving regularly.  No abdominal pain.  No flushing.  No diarrhea.  Objective:  Vital signs in last 24 hours:  Blood pressure 97/71, pulse 74, temperature 97.8 F (36.6 C), resp. rate 18, height 5' 2 (1.575 m), weight 141 lb 12.8 oz (64.3 kg), last menstrual period 05/17/2017, SpO2 98%.    HEENT: No thrush. Resp: Lungs clear bilaterally. Cardio: Regular rate and rhythm. GI: No hepatosplenomegaly.  Nontender. Vascular: No leg edema.     Lab Results:  Lab Results  Component Value Date   WBC 6.2 03/01/2023   HGB 12.8 03/01/2023   HCT 38.0 03/01/2023   MCV 90.5 03/01/2023   PLT 209 03/01/2023   NEUTROABS 3.4 03/01/2023    Imaging:  No results found.  Medications: I have reviewed the patient's current medications.  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   06/07/2018 MRI of the liver-numerous hepatic lesions scattered throughout all aspects of the liver.  06/14/2018 PET scan-enlarged and hypermetabolic perihepatic and portacaval lymph nodes; diffuse heterogeneous uptake within the liver with subtle areas of mildly increased FDG uptake corresponding to suspicious lesions identified on MRI.   06/21/2018 biopsy of a right liver lobe mass-low-grade neuroendocrine tumor consistent with metastasis, positive CD56, chromogranin, synaptophysin, CDX-2 and cytokeratin AE1/AE3. 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  Single focus of uptake in the proximal right humerus.  No clear primary tumor  identified. Normal chromogranin A 07/05/2018 and normal 24-hour urine 5 HIAA 08/03/2018 Monthly lanreotide beginning 08/03/2018 CT 12/15/2018-small hepatic metastases, mildly improved, slight increase in size of a porta hepatis node CT abdomen/pelvis 07/02/2019-some of the liver lesions are slightly larger, others stable or smaller, slight increase in splenic lesions, minimal increase in abdominal adenopathy Cycle 1 Lutathera  08/14/2019 Cycle 2 Lutathera  10/08/2019 Cycle 3 Lutathera  12/03/2019 Cycle 4 Lutathera  01/29/2020 Netspot  03/11/2020-marked reduction in activity within hepatic metastases with only 1 remaining measurable lesion, decrease in size and radiotracer activity involving periportal and peripancreatic lymph nodes, decreased radiotracer activity at a subcarinal node and resolution of activity at a left supraclavicular node, resolution of focal metabolic activity in the right humerus, near complete resolution of activity in the left T3 transverse process, decreased activity involving a right acetabular lesion, no evidence of new or progressive disease Monthly lanreotide continued Netspot  12/22/2020 (outside facility)-subjective stability of disease-very small left hepatic lobe and caudate metastatic lesions.  Additional hepatic metastatic lesions not completely excluded.  Metastatic hepatic hilar/portacaval node.  Low suspicion subcarinal lymph node. EUS 01/08/2021-suspicion of Barrett's esophagus 35 cm from the incisors,, no gross lesion in the duodenum or visualized jejunum, no significant pathology in the pancreas, common bile duct, no malignant appearing lymph nodes in the celiac, peripancreatic, porta hepatis region Netspot  12/28/2021-2 radiotracer avid lesions remain in the liver, small with activity minimally above background, no evidence of disease progression.  No evidence of neuroendocrine tumor elsewhere including mediastinal lymph nodes, periportal lymph nodes, and skeletal  metastases Netspot  12/29/2022: New small radiotracer avid mediastinal/upper abdominal lymph nodes, stable dominant left hepatic lobe lesion with a new left hepatic lesion, faint activity in the posterior right acetabulum-indeterminate EGD 01/11/2023:  Small erosions in the gastric biopsy, localized mild erythema in the gastric antrum-stomach biopsy revealed well-differentiated neuroendocrine tumor (2 mm) within moderate chronic inactive gastritis negative for H. pylori, CDX2 positive Sandostatin  60 mg every 4 weeks starting 03/01/2023 Netspot  06/23/2023: Stable pattern of metastatic disease, slight increase in radiotracer activity involving small metastatic mediastinal lymph nodes, stable activity in the liver, upper abdominal adenopathy, and a right acetabulum lesion Sandostatin  60 mg every 4 weeks continued 01/09/2024 Dotatate PET: New multifocal foci of uptake in the bones, increased size and tracer uptake involving tiny lung nodules, stable liver lesions, multiple splenic lesions Sandostatin  placed on hold beginning 01/11/2024 CTs 01/25/2024-since PET 01/09/2024 similar pulmonary and thoracoabdominal nodal metastasis.  Osseous metastasis are CT occult.  Hepatic metastasis relatively CT occult.  Heterogeneous enhancement of the pancreatic head.  3.   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 Ebbie Bilateral breast MRI 11/11/2020-no MRI evidence for malignancy in either breast. Bilateral mammogram 06/15/2021-no evidence of malignancy. Bilateral breast MRI 12/07/2021-no evidence of malignancy, 1 cm enhancing mass at the left liver unchanged-compatible with known neuroendocrine metastasis 4.   Hypercholesterolemia 5.   Nausea and vomiting following cycle 1 Lutathera  6.  Barrett's esophagus-changes at the GE junction suspicious for Barrett's esophagus on EGD 01/11/2023: Biopsy-Barrett's mucosa, negative for dysplasia    Disposition: Ms.  Shrake appears stable.  We reviewed the office note from her visit with Dr. Markham.  She had a telemedicine visit with Dr. Ralene 02/01/2024, note not available at the time of today's visit.  Plan at present is to follow-up with Dr. Markham regarding clinical trial eligibility.  Repeat liver function panel today.  Continue to hold Sandostatin .    She will return for follow-up and possible Sandostatin  injection in 4 weeks.  We are available to see her sooner if needed.  Patient seen with Dr. Cloretta.    Olam Ned ANP/GNP-BC   02/08/2024  8:26 AM  This was a shared visit with Olam Ned.  Ms. Ancheta appears stable.  She is asymptomatic from the carcinoid tumor.  We discussed the restaging CT results with her.  The CT reveals no evidence of disease progression compared to the December 2025 PET.  The liver lesions are difficult to see on CT.  I reviewed the CT images.  We discussed treatment options.  The liver enzymes remain elevated.  His possibly elevated liver enzymes are related to Sandostatin  therapy.  Sandostatin  will be placed on hold.  I will consult with Dr. Markham regarding clinical trial eligibility at [facility] versus observation versus initiating standard systemic therapy with cabozantinib.  Arvella Cloretta, MD      "

## 2024-02-23 ENCOUNTER — Other Ambulatory Visit (HOSPITAL_BASED_OUTPATIENT_CLINIC_OR_DEPARTMENT_OTHER)

## 2024-03-07 ENCOUNTER — Inpatient Hospital Stay

## 2024-03-07 ENCOUNTER — Inpatient Hospital Stay: Admitting: Oncology
